# Patient Record
Sex: Male | Born: 1965 | Race: White | Hispanic: No
Health system: Midwestern US, Community
[De-identification: ages and names within clinical notes are randomized; demographics above are authoritative.]

## PROBLEM LIST (undated history)

## (undated) ENCOUNTER — Encounter

---

## 2016-05-25 ENCOUNTER — Ambulatory Visit

## 2016-05-25 ENCOUNTER — Ambulatory Visit: Admitting: Internal Medicine

## 2016-05-25 NOTE — Progress Notes (Signed)
General Medicine Visit    Service Due by Standard Protocol Rules: PHQ2 SCORE, PATPORTALPIN.      Initial Screening   * [place]: [place] (Thelma)  Ht: 68 in.  Wt: 188 lbs.   BMI: 28.69  Temp: 97.1 deg F.     BP (Initial [place] Screening): 136 / 76      BP (Rechecked, Actionable): 130 / 82 mmHg   HR: 72     Healthy Eating materials? Weight Education Handout for high BMI  Travel outside of the [place] in past 28 days:: No  Chronic Pain Assessment: Does pt experience chronic pain ? NO  Smoking Assessment: Tobacco use? never smoker    Self Management materials provided? Printed handout: Keeping You Healthy    Med List: PRINTED by [place] for patient   Med List: PRINTED by [place] for patient     Falls Risk Assessment:   In the past year, have you ... Had no falls  Difficulty with balance? NO  Need assistance with ambulation while here? NO  .....................................Marland KitchenMarland KitchenGordy Savers  May 25, 2016 11:41 AM      CHIEF COMPLAINT:  Annual physical.    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  He is feeling physically well today.  His weight is down around 12 or 13 pounds over the past 3 months.  He started on a low-carb diet and is trying to take in less than 30 g of carbohydrates per day.  He eats lean proteins and vegetables although he "cheats" once per week.  He is quite happy with his progress.  His goal weight is around 170 pounds.  He is not currently exercising, but he does belong to gym and plans to get back to exercise.    He has been compliant with his medications including lisinopril and Crestor since his last visit with me number of years back.    He had a recent kidney stone that he passed.  He has not had any recent follow up with Dr. Daphane Shepherd in [facility].    PAST MEDICAL HISTORY:  C6-7 herniated disc, mild irritable bowel, hypertension, hyperlipidemia, lipoma removed from his back, chronic left knee pain, hematuria, nephrolithiasis.    MEDICATIONS:  Per medication  list.    ALLERGIES:  PENICILLIN, which caused a rash.    PAST SURGICAL HISTORY:  Ureteral stents.    SOCIAL HISTORY:  He has never smoked cigarettes.  He drinks alcohol rarely.  He denies recreational drug use.    FAMILY HISTORY:  He is married and monogamous for the past 10 years.  He has a 47-year-old son who is healthy.  His mother is alive at age 69 with possible early memory issues.  His father is alive at age 55 with history of coronary artery disease, status post MI and CABG.  He also has a history of diabetes and obesity.  He has a 57 year old sister who is overweight and otherwise healthy.    REVIEW OF SYSTEMS:  Tdap -- Unclear when this was given.  I will need to check with Employee Health.  He may be due for a booster.  He believes he has had hepatitis B immunization series.  He has not yet had colonoscopy and will be due next year.  He saw the ophthalmologist 6 months ago.  His near vision is worsening and he has been using reading glasses.  He denies chest pain, shortness of breath, palpitations, cough, fevers, chills, or sweats.  He  denies abdominal pain, nausea, vomiting, or GERD.  His bowels are slightly loose and they pass once a day.  He thinks this is related to his current diet.  He passes urine without a problem, although it is a little bit slow in the morning.  He reports nocturia 2-3 times per night.  This may be due to the large amounts of water he drinks due to his kidney stones.  He is sleeping better since he has lost weight.  He feels less congested in general on a low-carb diet.  No specific joint aches or pains.  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    PHYSICAL EXAMINATION:  Vital signs as above.  HEENT:  Pupils are equal, round, and reactive to light.  Extraocular movements are intact.  Conjunctivae are normal.  Sclerae are anicteric.  Oral mucosa is moist without lesions.  Teeth are in excellent repair.  TMs are normal bilaterally.  Neck is without  adenopathy, thyromegaly, or bruits.  Lungs are clear to auscultation bilaterally.  Heart is in a regular rhythm without murmurs, rubs, or gallops.  Abdomen is soft and nontender without organomegaly or masses.  Genital exam reveals normal circumcised phallus with 2 normally descended testes.  No hernias are noted.  Extremities reveal 2+ pedal pulses.  There is no edema or cyanosis.  There is no axillary or inguinal adenopathy.  Neurologic exam is intact.  Skin exam reveals numerous skin tags, macular hyperpigmented moles.  None of that appear worrisome.    ASSESSMENT AND PLAN:  1.  Hypertension -- Blood pressure well controlled on lisinopril.  He may check his blood pressures at home as he continues to lose weight.  If his BPs are consistently in the 100s-110s, he may decrease his lisinopril to 10 mg daily.  2.  Hyperlipidemia -- He remains on Crestor daily.  He will return for fasting labs later this week.  3.  Health maintenance -- He will remain on his low-carb diet.  His goal weight is 170 pounds.  I have asked him to get back to regular exercise regimen.  We will plan to check TSH along with his labs.  Will also check blood sugar and A1c.  4.  Return to the office yearly or p.r.n.      PATIENT NAME:  MARKICE, TORBERT     MR #: [id number]  DATE: [id number]  DICATED BY: Wynonia Lawman M.D. SIGNED BY:   [id number] DT: [id number]   [id number]    job #[id number]  Basic Visit                          Past Medical History (prior to today's visit):  HYPERTENSION (ICD-401.9) (ICD10-I10)  RENAL MASS (ICD-593.9) (ICD10-N28.89)  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-N20.0)  RENAL INSUFFICIENCY (ICD-593.9) (ICD10-N28.9)  VIRAL SYNDROME (ICD-079.99) (ICD10-B34.9)  KNEE PAIN, RIGHT (ICD-719.46) (ICD10-M25.561)  HYPERLIPIDEMIA (ICD-272.4) (ICD10-E78.5)  HEMATURIA, MICROSCOPIC (ICD-599.72) (ICD10-R31.2)  ACROCHORDON (ICD-701.9) (ICD10-L91.8)  OBESITY (ICD-278.01) (ICD10-E66.01)  SNORING (ICD-786.09) (JOA41-Y60.63)    Past Medical  History (changes today):  Added new problem of ANNUAL EXAM (ICD-V70.0) (ICD10-Z00.00) - Signed  Removed problem of RENAL MASS (ICD-593.9) (ICD10-N28.89) - Signed  Removed problem of VIRAL SYNDROME (ICD-079.99) (ICD10-B34.9) - Signed  Removed problem of KNEE PAIN, RIGHT (ICD-719.46) (ICD10-M25.561) - Signed  Added new problem of IRRITABLE BOWEL SYNDROME (ICD-564.1) (KZS01-U93.2) - Signed       Medications (prior to today's visit):  LISINOPRIL 20 MG TABS (  LISINOPRIL) Take one tablet by mouth once daily  CRESTOR 10 MG TABS (ROSUVASTATIN CALCIUM) Take one tablet by mouth once daily.  ASPIRIN 81 MG TABS (ASPIRIN) Take one tablet by mouth once daily  LORAZEPAM 0.5 MG TABS (LORAZEPAM) one tab 30 minutes before MRI. May repeat x 1 if needed    No Changes to Medication List              Vitals:   Ht: 68 in.  Wt: 188 lbs.  BMI (in-lb) 28.69  Temp: 97.1deg F.     BP (Initial [place] Screening): 136 / 76     BP (Rechecked, Actionable): 130 / 82 mmHg   Pulse Rate: 72 bpm                Orders (this visit):  Est Prev 40-64yo (1610/RU0454) [[id number]]    Patient Care Plan          Vaccine Info Sheets       Created By Karn Cassis on 05/25/2016 at 11:41 AM    Electronically Signed By Wynonia Lawman MD on 05/27/2016 at 01:57 PM

## 2016-05-25 NOTE — Progress Notes (Signed)
Division of General Medicine - Future Orders      Requested tests to be done: __(date)____________________________        Orders     PSA [Q28571E,L010322]  LYTES [CPT-82495]  BUN [Q30940E,L001040]  Creatinine (CR) [CPT-82565]  Glucose  [CPT-82947]  Hemoglobin A1C (Glycohemoglobin) [CPT-83036]  Lipid Profile-Chol, HDL, Trig, LDL(calc) [CPT-80061]  SGOT (AST/SGOT) [CPT-84450]  SGPT (ALT/SGPT) [CPT-84460]  Thyroid Stimulating Hormone (TSH) [RUE-45409]  Vit D 25 Hydroxy [CPT-82652]        Special orders:        Created By Wynonia Lawman MD on 05/25/2016 at 01:04 PM    Electronically Signed By Wynonia Lawman MD on 05/25/2016 at 01:05 PM

## 2016-08-20 ENCOUNTER — Ambulatory Visit

## 2016-08-20 ENCOUNTER — Ambulatory Visit: Admitting: Dermatology

## 2016-08-20 ENCOUNTER — Ambulatory Visit: Admitting: Student in an Organized Health Care Education/Training Program

## 2016-08-20 NOTE — Progress Notes (Signed)
* * *      **[name], [name]**    ------    52 Y old Male, DOB: [birth year], External MRN: [id number]    Account Number: [id number]    [address], [name], [place]    Home: [phone]    Guarantor: Hantz, [name] Insurance: [employer] PPO Payer ID: PAPER    PCP: [name], MD Referring: [name] External Visit ID:  [id number]    Appointment Facility: General Dermatology        * * *    08/20/2016  **Appointment Provider:** Essie Christine, MD **CHN#:** [id number]    ------     **Supervising Provider:** NATALIA PLOTNIKOVA    ---      Current Medications    ---    Taking    * Lisinopril 20 MG Tablet 1 tablet Orally Once a day    ---    * Loperamide A-D 2 MG Tablet 1 tablet Orally after each loose bowel movement not to exceed 8 tablets a day    ---    * Simvastatin Tablet 40 mg Take 1 tablet once a day by mouth once a day    ---    Not-Taking/PRN    * Azithromycin 500 MG Tablet 1 tablet Orally once a day for up to 3 days if diarrhea (may repeat if needed)    ---    * Vivotif Berna Vaccine . Capsule Delayed Release 1 Orally every other day on an empty stomach; complete 1 week prior to departure; complete at least 1 week before any antibiotics    ---    * Medication List reviewed and reconciled with the patient    ---     Past Medical History    ---      Hypertension.        ---    Hypercholesterolemia.        ---    Renal stones.        ---    Renal insufficiency.        ---     Family History    ---     Mother: alive, hypertension    ---    Father: cabg; cad    ---    Siblings: sister healthy    ---    no personal or fh skin cancer.    ---     Allergies    ---     Penicillin: anaphylaxis: Allergy    ---    [Allergies Verified]     Review of Systems    ---     _Derm ROS_[place]_ :    Constitutional symptoms: no fever, chills, unintended weight loss.          Reason for Appointment    ---     1\. 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 skin tags    -shoulders and neck    -ongoing for years    -bothered by appearance    -no pain or bleeding    -h/o mole removal, was benign    -no tanning bed use    2\. multiple brown big bumps, getting more over recent yrs    -irritating, interested in  removal    3) multiple brown moles, especially on trunk    -does not think they are changing colors or shapes.     Vital Signs    ---    Pain scale 0.     Examination    ---     [name] Exam_ :    Patient is well appearing, no acute distress, A&Ox3;, pleasant mood.  Examination of the scalp/hair, head/face, eyes, ears, oropharynx, neck, chest,  abdomen, back, buttocks, upper extremities, lower extremities nails, and  digits was performed. All of the following were normal except:    -trunk>upper and lower extremities with 100-200 2-27mm uniform medium to dark brown round macules with regular reticular and speckled pattern, some w/ slight shape irregularity    -neck, shoulders, upper back with number of fleshy skin colored pedunculated papules    -scattered brown waxy stuck on appearing plaques.         Assessments    ---    1\. 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 Achrochordon - L91.8    ---    3\. 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 Melanocytic nevi of trunk**    Notes: -large in number but highly uniform, no concerning lesions    -ABCDEs and ugly duckling reviewed    -sun protection and sun avoidance discussed    -fbse rec yearly.     ---        **2\. Achrochordon**    Notes: -numerous of neck and shoulders    -reassurance provided regarding benign appearance    -pt requesting tx, understands out of pocket cost    -f/u in cosmetic clinic for removal.         **3\. Seborrheic keratoses**    Notes: -reassurance provided    -pt requesting removal as above, will schedule in cosmetic  clinic.         **4\. Others**    Notes: I personally interviewed and examined the patient and both the resident  and I contributed to this electronic note. I agree with the history, exam,  assessment and plan as detailed in this note and edited it as necessary.     Follow Up    ---    1 Year (Reason: fbse)    **Appointment Provider:** Essie Christine, MD    Electronically signed by Modena Morrow MD, MD on 08/20/2016 at 10:49 AM  EST    Sign off status: Completed        * * *        General Dermatology    [address]    [place], [place] [address]    Tel: [phone]    Fax: [phone]              * * *         Patient: Brandon [name], Brandon [name] DOB: [birth year] Progress Note: Essie Christine, MD  08/20/2016    ---    Note generated by eClinicalWorks EMR/PM Software ([email or web address])

## 2016-08-20 NOTE — Progress Notes (Signed)
* * *        **  Rodriges, [name]    --- ---    89 Y old Male, DOB: [birth year]    [address] [name], [place] [address]    Home: [phone]    Provider: Adria Dill, MD        * * *    Telephone Encounter    ---    Answered by   Drema Pry  Date: 08/20/2016         Time: 10:41 AM    Caller   Patient    --- ---            Reason   Booking for Dr. Beacher May on 10/07/15            Message                      Please book this patient for Sosie Gato on 10/06/16 at 2:30 pm, per our discussion. Thank you very much.                Action Taken   Fusco,Eathen 08/20/2016 10:43:35 AM > Buckmire,Joanne  08/20/2016 11:16:40 AM > All set for 10/06/16 @2 :30pm                * * *                ---          * * *          Patient: Legros, Audley J DOB: [birth year] Provider: Adria Dill, MD  08/20/2016    ---    Note generated by eClinicalWorks EMR/PM Software ([email or web address])

## 2016-08-20 NOTE — Progress Notes (Signed)
.  Progress Notes  .  Patient: Brandon [name], Brandon [name]  Provider: Essie Christine  MD  .  DOB: 05-07-1966 Age: 50 Y Sex: Male  Supervising Provider:: Modena Morrow  Date: 08/20/2016  .  PCP: [name] MD  Date: 08/20/2016  .  --------------------------------------------------------------------------------  .  REASON FOR APPOINTMENT  .  1. Skin tags  .  HISTORY OF PRESENT ILLNESS  .  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 skin  tags-shoulders and neck-ongoing for years-bothered by  appearance-no pain or bleeding-h/o mole removal, was benign-no  tanning bed use2. multiple brown big bumps, getting more over  recent yrs-irritating, interested in removal3) multiple brown  moles, especially on trunk-does not think they are changing  colors or shapes.  .  CURRENT MEDICATIONS  .  Taking Lisinopril 20 MG Tablet 1 tablet Orally Once a day  Taking Loperamide A-D 2 MG Tablet 1 tablet Orally after each  loose bowel movement not to exceed 8 tablets a day  Taking Simvastatin Tablet 40 mg Take 1 tablet once a day by mouth  once a day  Not-Taking/PRN Azithromycin 500 MG Tablet 1 tablet Orally once a  day for up to 3 days if diarrhea (may repeat if needed)  Not-Taking/PRN Vivotif Berna Vaccine . Capsule Delayed Release 1  Orally every other day on an empty stomach; complete 1 week prior  to departure; complete at least 1 week before any antibiotics  Medication List reviewed and reconciled with the patient  .  PAST MEDICAL HISTORY  .  Hypertension  Hypercholesterolemia  Renal stones  Renal insufficiency  .  ALLERGIES  .  Penicillin: anaphylaxis: Allergy  .  FAMILY HISTORY  .  Mother: alive, hypertension  Father: cabg; cad  Siblings: sister healthy  no personal or fh skin cancer.  Marland Kitchen  REVIEW OF SYSTEMS  .  Derm ROS_MN:  .  Constitutional symptoms:    no fever, chills, unintended weight  loss .  Marland Kitchen  VITAL SIGNS  .  Pain scale  0.  .  EXAMINATION  .  General Dermatology Exam: Patient is well  appearing, no acute distress, A&Ox3, pleasant mood. Examination  of the scalp/hair, head/face, eyes, ears, oropharynx, neck,  chest, abdomen, back, buttocks, upper extremities, lower  extremities nails, and digits was performed. All of the following  were normal except:-trunk>upper and lower extremities with  100-200 2-18mm uniform medium to dark brown round macules with  regular reticular and speckled pattern, some w/ slight shape  irregularity-neck, shoulders, upper back with number of fleshy  skin colored pedunculated papules-scattered brown waxy stuck on  appearing plaques.  .  ASSESSMENTS  .  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  Achrochordon - L91.8  .  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  TREATMENT  .  Melanocytic nevi of trunk  Notes: -large in number but highly uniform, no concerning  lesions-ABCDEs and ugly duckling reviewed-sun protection and sun  avoidance discussed-fbse rec yearly.  .  .  Achrochordon  Notes: -numerous of neck and shoulders-reassurance provided  regarding benign appearance-pt requesting tx, understands out of  pocket cost-f/u in cosmetic clinic for removal.  .  .  Seborrheic keratoses  Notes: -reassurance provided-pt requesting removal as above, will  schedule in cosmetic clinic.  .  .  Others  Notes: I personally interviewed and examined the patient and both  the resident and  I contributed to this electronic note. I agree  with the history, exam, assessment and plan as detailed in this  note and edited it as necessary.  .  FOLLOW UP  .  1 Year (Reason: fbse)  .  Marland Kitchen  Appointment Provider: Essie Christine, MD  .  Electronically signed by Modena Morrow MD, MD  on 08/20/2016 at 10:49 AM EST  .  CONFIRMATORY SIGN OFF  .  Marland Kitchen  Document electronically signed by Essie Christine  MD  .

## 2016-10-06 ENCOUNTER — Ambulatory Visit: Admitting: Dermatology

## 2016-10-06 ENCOUNTER — Ambulatory Visit

## 2016-10-06 NOTE — Progress Notes (Signed)
* * *      **Brandon [name], Brandon [name]**    ------    51 Y old Male, DOB: 09-27-1965, External MRN: [id number]    Account Number: [id number]    [address], [name], [place]    Home: [phone]    Guarantor: Empey, [name] Insurance: [employer] PPO Payer ID: PAPER    PCP: [name], MD Referring: [name] External Visit ID:  [id number]    Appointment Facility: General Dermatology        * * *    10/06/2016  **Appointment Provider:** Damaris SchoonerCHN#:** [id number]    ------     **Supervising Provider:** Adria Dill, MD    ---      Current Medications    ---    Taking    * Lisinopril 20 MG Tablet 1 tablet Orally Once a day    ---    * Loperamide A-D 2 MG Tablet 1 tablet Orally after each loose bowel movement not to exceed 8 tablets a day    ---    * Simvastatin Tablet 40 mg Take 1 tablet once a day by mouth once a day    ---    Not-Taking/PRN    * Azithromycin 500 MG Tablet 1 tablet Orally once a day for up to 3 days if diarrhea (may repeat if needed)    ---    * Vivotif Berna Vaccine . Capsule Delayed Release 1 Orally every other day on an empty stomach; complete 1 week prior to departure; complete at least 1 week before any antibiotics    ---     Past Medical History    ---      Hypertension.        ---    Hypercholesterolemia.        ---    Renal stones.        ---    Renal insufficiency.        ---     Allergies    ---     Penicillin: anaphylaxis: Allergy    ---      Reason for Appointment    ---     1\. 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     Examination    ---     _General Dermatology Exam_ :    CHEST, BACK, NECK: Scattered flesh colored to medium brown fleshy papules and  flesh colored pedunculated papules.         Assessments    ---    1\. Acrochordon - L91.8 (Primary)    ---     Treatment    ---      **1\.  Acrochordon**    Notes: - discussed cosmetic treatment options including snip removal with  risks/benefits/alaternatives including scarring, bleeding, recurrence    - discussed pathology fee, specimens usually discarded but very rare  possibility of amelanotic melanoma; pt wishes to discard specimens    - 15 lesions anesthetized with 1% lido with epi and snipped with sterile  scissors at base of neck and R lateral chest wall    -$150.     ---     Procedure Codes    ---  754AA COSMETIC VISIT NO CHARGE    ---     Follow Up    ---    prn    **Appointment Provider:** Damaris Schooner    Electronically signed by Adria Dill MD on 10/06/2016 at 03:58 PM EST    Sign off status: Completed        * * *        General Dermatology    [address]    [place], [place] [address]    Tel: [phone]    Fax: [phone]              * * *         Patient: Brandon [name], Brandon [name] DOB: [birth year] Progress Note: Brandon [name]  10/06/2016    ---    Note generated by eClinicalWorks EMR/PM Software ([email or web address])

## 2016-10-06 NOTE — Progress Notes (Signed)
.    Progress Notes  .  Patient: Brandon [name], Brandon [name]  Provider: Damaris Schooner  MD  .  DOB: [birth year] Age: 51 Y Sex: Male  Supervising Provider:: Adria Dill, MD  Date: 10/06/2016  .  PCP: [name] MD  Date: 10/06/2016  .  --------------------------------------------------------------------------------  .  REASON FOR APPOINTMENT  .  1. REMOVAL  .  HISTORY OF PRESENT ILLNESS  .  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  .  CURRENT MEDICATIONS  .  Taking Lisinopril 20 MG Tablet 1 tablet Orally Once a day  Taking Loperamide A-D 2 MG Tablet 1 tablet Orally after each  loose bowel movement not to exceed 8 tablets a day  Taking Simvastatin Tablet 40 mg Take 1 tablet once a day by mouth  once a day  Not-Taking/PRN Azithromycin 500 MG Tablet 1 tablet Orally once a  day for up to 3 days if diarrhea (may repeat if needed)  Not-Taking/PRN Vivotif Berna Vaccine . Capsule Delayed Release 1  Orally every other day on an empty stomach; complete 1 week prior  to departure; complete at least 1 week before any antibiotics  .  PAST MEDICAL HISTORY  .  Hypertension  Hypercholesterolemia  Renal stones  Renal insufficiency  .  ALLERGIES  .  Penicillin: anaphylaxis: Allergy  .  EXAMINATION  .  General Dermatology Exam: CHEST, BACK, NECK:  Scattered flesh colored to medium brown fleshy papules and flesh  colored pedunculated papules.  .  ASSESSMENTS  .  Acrochordon - L91.8 (Primary)  .  TREATMENT  .  Acrochordon  Notes: - discussed cosmetic treatment options including snip  removal with risks/benefits/alaternatives including scarring,  bleeding, recurrence- discussed pathology fee, specimens usually  discarded but very rare possibility of amelanotic melanoma; pt  wishes to discard specimens- 15 lesions anesthetized with 1% lido  with epi and snipped with sterile scissors at base of neck and R  lateral chest wall-$150.  Marland Kitchen  PROCEDURE CODES  .  754AA  COSMETIC VISIT NO CHARGE  .  FOLLOW UP  .  prn  .  Marland Kitchen  Appointment Provider: Damaris Schooner  .  Electronically signed by Adria Dill MD on  10/06/2016 at 03:58 PM EST  .  CONFIRMATORY SIGN OFF  URMAN,CHRISTINE , MD 10/06/2016 3:58:18 PM > , I personally interviewed and examined the patient and both the resident and I contributed to this electronic note. I agree with the history, exam, assessment and plan as detailed in this note and edited it as necessary., I was present for the procedure (detailed above) performed by the resident.  .  Document electronically signed by Damaris Schooner  MD  .

## 2017-05-05 ENCOUNTER — Ambulatory Visit

## 2017-05-05 NOTE — Telephone Encounter (Signed)
Call Details:   I spoke w. pt. by phone.  Slowing urinary stream.  He was not ready to start a med yet.......................................Marland KitchenSherrilee Gilles, MD  May 05, 2017 11:33 AM      Roney Jaffe helped.  Should we do a script?  [name]    RESPONSE/ORDERS:    Sure. Where would you like it sent to?  You also need an appt.  [facility]    [facility]  Website  Directions    Save    Address: [address] [place], [place] [address]                       Phone: [phone]      Stupid question: how do i make an appt?    Can you see me on Fri 06/04/17 at 11:00AM in [facility] on [address] and we can do your annual PE?                  ORDERS/PROBS/MEDS/ALL     Problems:   ANNUAL EXAM (ICD-V70.0) (ICD10-Z00.00)  HYPERTENSION (ICD-401.9) (ICD10-I10)  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-N20.0)  RENAL INSUFFICIENCY (ICD-593.9) (ICD10-N28.9)  HYPERLIPIDEMIA (ICD-272.4) (ICD10-E78.5)  HEMATURIA, MICROSCOPIC (ICD-599.72) (ICD10-R31.2)  ACROCHORDON (ICD-701.9) (ICD10-L91.8)  OBESITY (ICD-278.01) (ICD10-E66.01)  SNORING (ICD-786.09) (ICD10-R06.83)  IRRITABLE BOWEL SYNDROME (ICD-564.1) (ICD10-K58.9)    Meds (prior to this call):   LISINOPRIL 20 MG ORAL TABLET (LISINOPRIL) Take one tablet by mouth once daily  CRESTOR 10 MG ORAL TABLET (ROSUVASTATIN CALCIUM) Take one tablet by mouth once daily.  ASPIRIN 81 MG ORAL TABLET (ASPIRIN) Take one tablet by mouth once daily  LORAZEPAM 0.5 MG ORAL TABLET (LORAZEPAM) one tab 30 minutes before MRI. May repeat x 1 if needed    Changes to Meds (this update):   Added new medication of TAMSULOSIN HCL 0.4 MG ORAL CAPSULE (TAMSULOSIN HCL) Take one capsule by mouth once daily - Signed  Rx of TAMSULOSIN HCL 0.4 MG ORAL CAPSULE (TAMSULOSIN HCL) Take one capsule by mouth once daily;  #30[Capsule] x 5;  Signed;  Entered by: [name], MD;  Authorized by: [name], MD;  Method used: [facility] [place] [facility] [address] [address], [place], [place]  , Ph: [phone], Fax: [phone]        Created By  Wynonia Lawman MD on 05/05/2017 at 11:32 AM    Electronically Signed By Wynonia Lawman, MD - [facility] on 05/06/2017 at 10:39 AM

## 2017-06-10 ENCOUNTER — Ambulatory Visit: Admitting: Internal Medicine

## 2017-06-10 ENCOUNTER — Ambulatory Visit

## 2017-06-10 LAB — HX BF-URINALYSIS
HX KETONES: NEGATIVE mg/dL
HX LEUKOCYTE ES: NEGATIVE
HX NITRITE LEVEL: NEGATIVE
HX SPECIFIC GRAVITY: 1.018
HX U BILIRUBIN: NEGATIVE
HX U BLOOD: NEGATIVE
HX U GLUCOSE: NEGATIVE mg/dL
HX U PH: 5
HX U PROTEIN: NEGATIVE mg/dL
HX U UROBILINIG: 0.2 EU

## 2017-06-10 LAB — HX CHEM-PANELS
HX ANION GAP: 8 (ref 3–14)
HX BLOOD UREA NITROGEN: 33 mg/dL — ABNORMAL HIGH (ref 6–24)
HX CHLORIDE (CL): 108 meq/L (ref 98–110)
HX CO2: 25 meq/L (ref 20–30)
HX CREATININE (CR): 1.3 mg/dL (ref 0.57–1.30)
HX GFR, AFRICAN AMERICAN: 74 mL/min/{1.73_m2}
HX GFR, NON-AFRICAN AMERICAN: 63 mL/min/{1.73_m2}
HX GLUCOSE: 98 mg/dL (ref 70–139)
HX POTASSIUM (K): 4.2 meq/L (ref 3.6–5.1)
HX SODIUM (NA): 141 meq/L (ref 135–145)

## 2017-06-10 LAB — HX CHEM-LIPIDS
HX CHOL-HDL RATIO: 7.6
HX CHOLESTEROL: 243 mg/dL — ABNORMAL HIGH (ref 110–199)
HX HIGH DENSITY LIPOPROTEIN CHOL (HDL): 32 mg/dL — ABNORMAL LOW (ref 35–75)
HX HOURS FAST: 2 h
HX LDL CHOLESTEROL, DIRECT: 161 mg/dL — ABNORMAL HIGH (ref 0–129)
HX TRIGLYCERIDES: 434 mg/dL — ABNORMAL HIGH (ref 40–250)

## 2017-06-10 LAB — HX DIABETES
HX GLUCOSE: 98 mg/dL (ref 70–139)
HX HEMOGLOBIN A1C: 5.4 %

## 2017-06-10 LAB — HX CHEM-LFT
HX ALANINE AMINOTRANSFERASE (ALT/SGPT): 50 IU/L (ref 0–54)
HX ASPARTATE AMINOTRANFERASE (AST/SGOT): 25 IU/L (ref 10–42)

## 2017-06-10 LAB — HX CHOLESTEROL
HX CHOLESTEROL: 243 mg/dL — ABNORMAL HIGH (ref 110–199)
HX HIGH DENSITY LIPOPROTEIN CHOL (HDL): 32 mg/dL — ABNORMAL LOW (ref 35–75)
HX LDL CHOLESTEROL, DIRECT: 161 mg/dL — ABNORMAL HIGH (ref 0–129)
HX TRIGLYCERIDES: 434 mg/dL — ABNORMAL HIGH (ref 40–250)

## 2017-06-10 LAB — HX CHEM-METABOLIC
HX HEMOGLOBIN A1C: 5.4 %
HX PSA DIAGNOSTIC: 0.93 ng/mL (ref 0.00–4.00)

## 2017-06-10 LAB — HX PSA: HX PSA DIAGNOSTIC: 0.93 ng/mL (ref 0.00–4.00)

## 2017-06-10 NOTE — Progress Notes (Signed)
[facility] June 10, 2017  [address]  [place] [address]  Main: [phone]  Fax:   Patient Portal: [email or web address]            This form should be completed fully then forwarded to the Gastroenterology Scheduling Desktop    Name: Brandon [name] Gender Male DOB: [birth year] [facility] Medical Record #: [id number]  Interpreter Needed:     Yes / No  Language: English  Dialect:                    Address: [address]  [place] [address]  Email:[email or web address]  List all phone numbers and indicate the preferred number:  [  ]Home#: [phone]   [  ]Mobile #:    [  ]Work#: [phone] Best Time to Call:                     Insurance Carrier: [id number] [place] PPO Policy ID#: [id number]  Referring MD Name : [name], MD - [facility], Viviann Spare   Referring MD Extension: [phone]        REQUESTING PROCEDURE  [ X  ] Screening Colonoscopy   [   ] Colonoscopy   [   ] EGD    [   ] ERCP      [   ] EUS    [   ] Capsule Endoscopy   [   ] Flexible Sigmoidoscopy        [   ] Motility Testing   [   ] Bronchoscopy   [   ] Other: _______________  Desired procedure physician (If any):  _______________  **Urgency of Procedure:    [   ] Urgent   [   ] 1-2 weeks   [   ] Next Month  [   ] Next Available  [   ] Other:_______  Reason for Procedure:      [  X  ] Z12.11 Encounter for screening for malignant neoplasm of colon        [    ] Z80.0  Family history of malignant neoplasm of digestive disorders        [    ] Z41.010  Personal history of colonic polyps  [    ] K76.2 Gastrointestinal hemorrhage  [    ] Other Reason (please list ICD-10 codes) : _____________________________________________  MEDICAL HISTORY  [ X    ] Please check here if any of the following problems below exist for the patient        Please provide additional details if available if any of the following apply to the patient:  History of Sedation Problems:  [   ] YES /   [ X  ] NO  Home Oxygen:      [   ] YES  /  [ X  ] NO   Active  Substance Abuse:     [   ] YES /   Arly.Keller   ] NO   Psychosis:    [   ] YES  /  [  X ] NO    Presence of tracheostomy:      [   ] YES /   [ X  ] NO  Poor Functional Status:   [   ] YES  /  Arly.Keller   ] NO  Hospitalization last 30 days:  [   ] YES  /  [  X   ] NO  Bleeding Disorder: [   ] YES  /  Arly.Keller   ] NO     Anti-coag:   [ X  ] YES  /  [   ]  NO  If applicable, which anti-coagulant?      [ X  ] ASA  [   ] Coumadin  [   ] Lovenox  [   ] Plavix  [   ] Other______________       Can anti-coag medications be stopped for this procedure?     Arly.Keller   ] YES  /  [   ] NO       Plan for holding & restarting anti-coag medications: _____________________________________________      Additional Notes: _______________________________________________________    _____________________________________________________________________    _____________________________________________________________________      Appointment date & time: _________________________________________________        Name: Brandon [name] Gender Male DOB: 10/15/1965 [place] Medical Record #: [id number]    Medications on 06/10/2017:  LISINOPRIL 20 MG ORAL TABLET (LISINOPRIL) Take one tablet by mouth once daily  CRESTOR 10 MG ORAL TABLET (ROSUVASTATIN CALCIUM) Take one tablet by mouth once daily.  ASPIRIN 81 MG ORAL TABLET (ASPIRIN) Take one tablet by mouth once daily  LORAZEPAM 0.5 MG ORAL TABLET (LORAZEPAM) one tab 30 minutes before MRI. May repeat x 1 if needed  TAMSULOSIN HCL 0.4 MG ORAL CAPSULE (TAMSULOSIN HCL) Take one capsule by mouth once daily  RA SAW PALMETTO 160 MG ORAL CAPSULE (SAW PALMETTO (SERENOA REPENS))       Allergies on 06/10/2017:  No allergies entered    Problems on 06/10/2017:  SCREENING CANCER, COLON (ICD-V76.51) (ICD10-Z12.11)  URINARY FREQUENCY (ICD-788.41) (ICD10-R35.0)  ANNUAL EXAM (ICD-V70.0) (ICD10-Z00.00)  HYPERTENSION (ICD-401.9) (ICD10-I10)  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-N20.0)  RENAL INSUFFICIENCY (ICD-593.9) (ICD10-N28.9)  HYPERLIPIDEMIA  (ICD-272.4) (ICD10-E78.5)  HEMATURIA, MICROSCOPIC (ICD-599.72) (ICD10-R31.2)  ACROCHORDON (ICD-701.9) (ICD10-L91.8)  OBESITY (ICD-278.01) (ICD10-E66.01)  SNORING (ICD-786.09) (ICD10-R06.83)  IRRITABLE BOWEL SYNDROME (ICD-564.1) (YTK16-W10.9)            Created By Wynonia Lawman, MD - [facility] on 06/10/2017 at 02:53 PM    Electronically Signed By Wynonia Lawman, MD - [facility] on 08/08/2017 at 01:21 PM

## 2017-06-10 NOTE — Progress Notes (Signed)
Miscellaneous Note          pt is schedulle as follows     ophthalmology on 10/27/2017 10:30 AM with Dr. Jenne Campus    ......................................Marland KitchenEnid Skeens  June 10, 2017 3:43 PM            Allergies not determined    Plan   Medication List:   LISINOPRIL 20 MG ORAL TABLET (LISINOPRIL) Take one tablet by mouth once daily  CRESTOR 10 MG ORAL TABLET (ROSUVASTATIN CALCIUM) Take one tablet by mouth once daily.  ASPIRIN 81 MG ORAL TABLET (ASPIRIN) Take one tablet by mouth once daily  LORAZEPAM 0.5 MG ORAL TABLET (LORAZEPAM) one tab 30 minutes before MRI. May repeat x 1 if needed  TAMSULOSIN HCL 0.4 MG ORAL CAPSULE (TAMSULOSIN HCL) Take one capsule by mouth once daily  RA SAW PALMETTO 160 MG ORAL CAPSULE (SAW PALMETTO (SERENOA REPENS))                   Created By Enid Skeens on 06/10/2017 at 03:41 PM    Electronically Signed By Sueanne Margarita RN on 06/14/2017 at 02:25 PM

## 2017-06-10 NOTE — Progress Notes (Signed)
Orders     [facility] venipuncture/phlebotomy - blood draw done in [facility] ([id number]) [[id number]]  .......................................Angelia Mould RN [facility])  June 10, 2017 5:08 PM  ......................................Marland KitchenEnid Skeens  June 10, 2017 4:58 PM        Created By Enid Skeens on 06/10/2017 at 04:58 PM    Electronically Signed By Angelia Mould RN ([facility]) on 06/10/2017 at 05:08 PM

## 2017-07-06 ENCOUNTER — Ambulatory Visit: Admitting: Otolaryngology

## 2017-07-06 ENCOUNTER — Ambulatory Visit

## 2017-07-06 NOTE — Progress Notes (Signed)
* * *        **Brandon [name]**    --- ---    51 Y old Male, DOB: 1965/11/23, External MRN: [id number]    Account Number: [id number]    [address], [address] [name], [place]    Home: [phone]    Guarantor: Brandon [name] Insurance: [employer] PPO Payer ID: PAPER    PCP: Brandon Gilles, MD Referring: Brandon [name] External Visit ID:  [id number]    Appointment Facility: Otolaryngology        * * *    07/06/2017  Progress Notes: Brandon Miles, MD **CHN#:** [id number] [id number]    --- ---    ---        Current Medications    ---    Taking     * Lisinopril 20 MG Tablet 1 tablet Orally Once a day    ---    * Loperamide A-D 2 MG Tablet 1 tablet Orally after each loose bowel movement not to exceed 8 tablets a day    ---    * Simvastatin Tablet 40 mg Take 1 tablet once a day by mouth once a day    ---    Not-Taking/PRN    * Azithromycin 500 MG Tablet 1 tablet Orally once a day for up to 3 days if diarrhea (may repeat if needed)    ---    * Vivotif Berna Vaccine . Capsule Delayed Release 1 Orally every other day on an empty stomach; complete 1 week prior to departure; complete at least 1 week before any antibiotics    ---    * Medication List reviewed and reconciled with the patient    ---      Past Medical History    ---       Hypertension.        ---    Hypercholesterolemia.        ---    Renal stones.        ---    Renal insufficiency.        ---      Surgical History    ---      Denies Past Surgical History    ---      Family History    ---      Mother: alive, hypertension, diagnosed with Test    ---    Father: cabg; cad, diagnosed with Test    ---    Siblings: sister healthy    ---    no personal or fh skin cancer.    ---      Social History    ---    Tobacco history: Never smoked.    Work/Occupation: employed full-time.    Alcohol Socially.    Illicit drugs: Denies.   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    ---      Allergies    ---      Penicillin: anaphylaxis: Allergy    ---    [Allergies  Verified]      Hospitalization/Major Diagnostic Procedure    ---      Denies Past Hospitalization    ---      Review of Systems    ---     _ENT Constitutional_ :    Constitutional No. weight gain No. weight loss No. loss of appetite No, no.  fever No. weakness No. fatigue No.    _ENT Cardiac/Vascular_ :    chest pain  No. shortness of breath No. palpitations No. leg swelling No. calf  pain No. dark or dusty skin color No.    _ENT Respiratory_ :    cough No. wheezing No. coughing up blood No.    _ENT Dermatology_ :    rash No. moles No. skin cancer No. hives No. acne No.    _ENT Gastro-intestinal_ :    nausea No. vomiting No. diarrhea No. heartburn No. difficulty swallowing No.  blood in stool No. vomiting blood No. constipation No. abdominal pain No.    _ENT Hematology_ :    easy bleeding No. easy bruising No. swollen glands No.    _ENT Orthopedic/Rheumatology_ :    swollen joints No. joint pain No. back pain No. joint stiffness No. leg cramps  No. easy fractures No.    _ENT Neurology_ :    headaches No. seizures No. difficulty walking No. difficulty with speech No.  memory difficulty No. numbness/tingling No. weakness No. loss of coordination  No. difficulty sleeping No.    _ENT Psychology_ :    anxiousness No. depression No. impulsive behavior No. suicidal thoughts No.  hearing voices No. visual hallucinations No. eating disorders No. physical or  emotional abuse No.    _ENT Ophthalmology_ :    loss of vision No. double vision No. eye swelling No. eye pain No. eye dryness  No. eye drainage No.    _ENT Endocrinology_ :    cold intolerance No. heat intolerance No. increased thirst No. increased  urination No.    _ENT_ :    hearing loss No. ringing in the ears No. ear pain No. dizziness/balance  problems No. nasal congestion No. sinus pain No. nasal drainage No. postnasal  drip No. hoarseness No. throat pain No. neck swelling/pain No.            Reason for Appointment    ---      1\. Right ear blockage    ---       History of Present Illness    ---     _Ear_ :    51 year old male presents with 2 days of right ear blockage. He developed a  sore throat and nasal congestion about 10 days ago. Sore throat has resolved.  Two days ago, the right ear became blocked. He has been using Flonase and  Afrin with improvement in nasal congetion but no change in ear blockage. No  history of allergies.    Denies : hearing loss. tinnitus. otalgia. otorrhea. vertigo. frequent  childhood ear infections. ear surgery.      Vital Signs    ---    Pain scale 0.      Examination    ---     _Constitutional_ :    General Appearance wdwn, NAD, exibits no signs of acute distress.    Communication Patient communicates easily and understands our conversation.    Vocal Quality Normal.    _Neurotology exam_ :    Neurologic exam A&O x 3, CN II-XII Intact. Facial strength HB Grade I, EOMI,  Gaze Alignment normal.    _Ears_ :    Ears normal pinnae bilaterally, normal ear canal bilaterally, left TM normal,  right **TM intact with fluid meniscus in inferior/posterior middle ear with  aeration superiorly/anteriorly** **,** **microscopic** exam.    _Nose_ :    Nose dorsum straight, normal, septum midline, turbinates normal, middle meati  normal, mucosa normal, no purulent drainange .    _Oral Cavity/Oropharynx_ :    Oral Cavity/Oropharynx  lips normal, buccal mucosa normal, teeth normal,  gingiva normal, floor of mouth normal, tongue normal, hard and soft palate  normal, tonsils 2+. Posterior pharynx with normal mucosa and no lesions,  Posterior pharangeal wall normal.    _Neck_ :    Neck No adenopathy, No masses, Parotid glands normal, Submandibular glands  normal, Laryngeal framework normal, Thyroid gland normal without enargement,  tenderness or masses, Neck muscles normal, No TMJ or Muscular tenderness.    _Head and Face_ :    Overall appearance Normal.    Sinus Tenderness None.    _Skin/Neck/Lymph_ :    Overall appearance (deformity, masses): No deformity or  masses.    Face and Neck Skin: No scars, no lesions, normal texture.    Palpation/percussion of face, sinus tenderness: Palpation/percussion of face,  with no sinus tenderness.    Larynx, trachea (eg, position, crepitus, tenderness): Normal position, no  crepitius or tenderness.    _Results_ :    __.          Assessments    ---    1\. Right acute serous otitis media, recurrence not specified - H65.01  (Primary)    ---      Right serous otitis media in settin of recent URI. Patient deferred  fiberoptic laryngoscopy.    ---      Treatment    ---       **1\. Right acute serous otitis media, recurrence not specified**    Start PredniSONE Tablet, 20 MG, 2 tablets, Orally, Once a day, 2 day(s), 4,  Refills 0    Notes: Short steroid burst    If no improvement, recommend CT temporal bone.    ---        **2\. Others**    Notes: I personally interviewed and examined the patient and, if applicable,  performed the documented procedure with Benetta Spar PA-C who was  involved in the visit today. I agree with the history, exam, procedure  (description and findings if applicable) and assessment and plan as detailed  in the above note in its entirety. Because this note was not dictated by  myself or the resident, but some of the note content may have been put into  the record by the resident via eClinicalWorks, the content was checked for  accuracy based on my own interactions today and I made appropriate corrections  and additions before locking the note for the permanent record.      Follow Up    ---    2 Weeks, sooner prn    Electronically signed by Brandon [name] , MD on 07/06/2017 at 09:50 PM EDT    Sign off status: Completed        * * *        Otolaryngology    [address]    [place], [place] [address]    Tel: [phone]    Fax: [phone]              * * *          Patient: Brandon [name], Brandon [name] DOB: Dec 04, 1965 Progress Note: Brandon Miles, MD  07/06/2017    ---    Note generated by eClinicalWorks EMR/PM Software  ([email or web address])

## 2017-07-06 NOTE — Progress Notes (Signed)
.  Progress Notes  .  Patient: Brandon [name], Brandon [name]  Provider: Elijio Miles  DOB:02/18/1966 Age: 51 Y Sex: Male  .  PCP: [name] MD  Date: 07/06/2017  .  --------------------------------------------------------------------------------  .  REASON FOR APPOINTMENT  .  1. Right ear blockage  .  HISTORY OF PRESENT ILLNESS  .  Ear:  51 year old male presents with 2 days of  right ear blockage. He developed a sore throat and nasal  congestion about 10 days ago. Sore throat has resolved. Two days  ago, the right ear became blocked. He has been using Flonase and  Afrin with improvement in nasal congetion but no change in ear  blockage. No history of allergies.  Denies : hearing loss  .  tinnitus  .  otalgia  .  otorrhea  .  vertigo  .  frequent childhood ear infections  .  ear surgery  .  Marland Kitchen  CURRENT MEDICATIONS  .  Taking Lisinopril 20 MG Tablet 1 tablet Orally Once a day  Taking Loperamide A-D 2 MG Tablet 1 tablet Orally after each  loose bowel movement not to exceed 8 tablets a day  Taking Simvastatin Tablet 40 mg Take 1 tablet once a day by mouth  once a day  Not-Taking/PRN Azithromycin 500 MG Tablet 1 tablet Orally once a  day for up to 3 days if diarrhea (may repeat if needed)  Not-Taking/PRN Vivotif Berna Vaccine . Capsule Delayed Release 1  Orally every other day on an empty stomach; complete 1 week prior  to departure; complete at least 1 week before any antibiotics  Medication List reviewed and reconciled with the patient  .  PAST MEDICAL HISTORY  .  Hypertension  Hypercholesterolemia  Renal stones  Renal insufficiency  .  ALLERGIES  .  Penicillin: anaphylaxis: Allergy  .  SURGICAL HISTORY  .  Denies Past Surgical History  .  FAMILY HISTORY  .  Mother: alive, hypertension, diagnosed with Test  Father: cabg; cad, diagnosed with Test  Siblings: sister healthy  no personal or fh skin cancer.  .  SOCIAL HISTORY  .  .  Tobacco  history: Never smoked.  .  Work/Occupation: employed full-time.  .  Alcohol   Socially.  .  Illicit drugs: Denies.  .  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  .  HOSPITALIZATION/MAJOR DIAGNOSTIC PROCEDURE  .  Denies Past Hospitalization  .  REVIEW OF SYSTEMS  .  ENT Constitutional:  .  Constitutional No. weight gain No. weight loss No. loss of  appetite No,     no . fever No. weakness No. fatigue No.  .  ENT Cardiac/Vascular:  .  chest pain No. shortness of breath No. palpitations No. leg  swelling No. calf pain No. dark or dusty skin color No.  .  ENT Respiratory:  .  cough No. wheezing No. coughing up blood No.  .  ENT Dermatology:  .  rash No. moles No. skin cancer No. hives No. acne No.  .  ENT Gastro-intestinal:  .  nausea No. vomiting No. diarrhea No. heartburn No. difficulty  swallowing No. blood in stool No. vomiting blood No. constipation  No. abdominal pain No.  .  ENT Hematology:  .  easy bleeding No. easy bruising No. swollen glands No.  .  ENT Orthopedic/Rheumatology:  .  swollen joints No. joint pain No. back pain No. joint stiffness  No. leg cramps No. easy  fractures No.  .  ENT Neurology:  .  headaches No. seizures No. difficulty walking No. difficulty with  speech No. memory difficulty No. numbness/tingling No. weakness  No. loss of coordination No. difficulty sleeping No.  .  ENT Psychology:  .  anxiousness No. depression No. impulsive behavior No. suicidal  thoughts No. hearing voices No. visual hallucinations No. eating  disorders No. physical or emotional abuse No.  .  ENT Ophthalmology:  .  loss of vision No. double vision No. eye swelling No. eye pain  No. eye dryness No. eye drainage No.  .  ENT Endocrinology:  .  cold intolerance No. heat intolerance No. increased thirst No.  increased urination No.  .  ENT:  .  hearing loss No. ringing in the ears No. ear pain No.  dizziness/balance problems No. nasal congestion No. sinus pain  No. nasal drainage No. postnasal drip No. hoarseness No. throat  pain No. neck swelling/pain No.  .  VITAL SIGNS  .  Pain  scale 0.  .  EXAMINATION  .  Constitutional:  General Appearancewdwn, NAD, exibits no signs of acute distress.  CommunicationPatient communicates easily and understands our  conversation.  Vocal QualityNormal.  .  Neurotology exam:  Neurologic examA&O x 3, CN II-XII Intact. Facial strength HB  Grade I, EOMI, Gaze Alignment normal.  .  Ears:  Earsnormal pinnae bilaterally, normal ear canal bilaterally, left  TM normal, right TM intact with fluid meniscus in  inferior/posterior middle ear with aeration  superiorly/anteriorly, microscopic exam.  .  Nose:  Nosedorsum straight, normal, septum midline, turbinates normal,  middle meati normal, mucosa normal, no purulent drainange .  Marland Kitchen  Oral Cavity/Oropharynx:  Oral Cavity/Oropharynxlips normal, buccal mucosa normal, teeth  normal, gingiva normal, floor of mouth normal, tongue normal,  hard and soft palate normal, tonsils 2+. Posterior pharynx with  normal mucosa and no lesions, Posterior pharangeal wall normal.  .  Neck:  NeckNo adenopathy, No masses, Parotid glands normal,  Submandibular glands normal, Laryngeal framework normal, Thyroid  gland normal without enargement, tenderness or masses, Neck  muscles normal, No TMJ or Muscular tenderness.  .  Head and Face:  Overall appearanceNormal.  Sinus TendernessNone.  .  Skin/Neck/Lymph:  Overall appearance (deformity, masses):No deformity or masses.  Face and Neck Skin:No scars, no lesions, normal texture.  Palpation/percussion of face, sinus  tenderness:Palpation/percussion of face, with no sinus  tenderness.  Larynx, trachea (eg, position, crepitus, tenderness):Normal  position, no crepitius or tenderness.  .  Results: __.  .  ASSESSMENTS  .  Right acute serous otitis media, recurrence not specified -  H65.01 (Primary)  .  Right serous otitis media in settin of recent URI. Patient  deferred fiberoptic laryngoscopy.  .  TREATMENT  .  Right acute serous otitis media, recurrence not  specified  Start PredniSONE Tablet, 20 MG,  2 tablets, Orally, Once a day, 2  day(s), 4, Refills 0  Notes: Short steroid burstIf no improvement, recommend CT  temporal bone.  .  .  Others  Notes: I personally interviewed and examined the patient and, if  applicable, performed the documented procedure with Benetta Spar PA-C who was involved in the visit today. I agree with  the history, exam, procedure (description and findings if  applicable) and assessment and plan as detailed in the above note  in its entirety. Because this note was not dictated by myself or  the resident, but some of the note content may have been put into  the record by the resident via eClinicalWorks, the content was  checked for accuracy based on my own interactions today and I  made appropriate corrections and additions before locking the  note for the permanent record.  .  FOLLOW UP  .  2 Weeks, sooner prn  .  Electronically signed by Elijio Miles , MD on  07/06/2017 at 09:50 PM EDT  .  Document electronically signed by Elijio Miles    .

## 2017-07-11 ENCOUNTER — Ambulatory Visit

## 2017-07-11 NOTE — Progress Notes (Signed)
[facility] July 11, 2017  [address]   [place]  [place] [address]  Main: [phone]  Fax: [phone]  Patient Portal: [name]  [address]  [place], [place]  [address]          MR#: [id number]          DOB: March 25, 1966    Dear  [name],    The results of the tests performed during your visit are as follows:    Cholesterol Tests   Cholesterol 243 Normal = 110-199 06/10/2017   HDL (good cholesterol) 32 Normal > 40 06/10/2017   Triglyceride 434 Normal = 40-250 06/10/2017   LDL (bad cholesterol) 161 Normal < 130 if you have high blood pressure 06/10/2017     Diabetes Tests   Glucose random 98 Normal = 60-150 06/10/2017   HgbA1c 5.4 Normal = 4.3-5.6 06/10/2017     Liver Tests   ALT 50 Normal = 0-54 06/10/2017   AST 25 Normal = 10-42 06/10/2017     Prostate Test   PSA 0.93  Normal = 0-4.0 06/10/2017     Kidney Tests   BUN 33 Normal = 6-24 06/10/2017   Creatinine 1.30 Normal = 0.57-1.30 06/10/2017   Potassium 4.2  Normal = 3.6-5.1 06/10/2017     Your cholesterol values are too high.  Be sure that you are taking your Crestor (rosuvastatin) DAILY!  Sugar is normal.  No sign of diabetes. Prostate psa is in the low normal range. Good to see.  Have you started on saw palmetto yet?  All other labs look ok.    Please call with any questions or concerns.    Sincerely,       Wynonia Lawman, MD - [facility]  [facility]  [phone]          Created By Wynonia Lawman, MD - [facility] on 07/11/2017 at 11:52 AM    Electronically Signed By Wynonia Lawman, MD - [facility] on 07/11/2017 at 11:52 AM

## 2017-07-15 ENCOUNTER — Ambulatory Visit

## 2017-08-02 ENCOUNTER — Ambulatory Visit

## 2017-08-02 NOTE — Telephone Encounter (Signed)
Call Details:   Brandon [name],  At a conference in [place], had a few minutes to email.    1.  Forgot to refill Flomax.  (Flomax holiday)  Seem to be doing better than before I started [employer].  So maybe its actually working!  I'll keep taking it.  Have only been half compliant.  Been taking it qd at 160 which is half as much as you suggested.  Never can remember at the end of the day.  But given the mild improvement I'll double up and try to take with food as recommended.    2.  Started the new statin, do you want to test my LFTs?    [name]        [name], MD, CCD, DABR  Chief, Musculoskeletal Imaging and Intervention Director, Bone Densitometry Department of Radiology [facility] Director, Undergraduate Radiology Education Assistant Professor of Radiology and Orthopedics [facility]      RESPONSE/ORDERS:    future orders put in.......................................Marland KitchenSherrilee Gilles, MD  August 04, 2017 12:41 PM               ORDERS/PROBS/MEDS/ALL     Problems:   SCREENING CANCER, COLON (ICD-V76.51) (ICD10-Z12.11)  URINARY FREQUENCY (ICD-788.41) (ICD10-R35.0)  ANNUAL EXAM (ICD-V70.0) (ICD10-Z00.00)  HYPERTENSION (ICD-401.9) (ICD10-I10)  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-N20.0)  RENAL INSUFFICIENCY (ICD-593.9) (ICD10-N28.9)  HYPERLIPIDEMIA (ICD-272.4) (ICD10-E78.5)  HEMATURIA, MICROSCOPIC (ICD-599.72) (ICD10-R31.2)  ACROCHORDON (ICD-701.9) (ICD10-L91.8)  OBESITY (ICD-278.01) (ICD10-E66.01)  SNORING (ICD-786.09) (ICD10-R06.83)  IRRITABLE BOWEL SYNDROME (ICD-564.1) (ICD10-K58.9)    Meds (prior to this call):   LISINOPRIL 20 MG ORAL TABLET (LISINOPRIL) Take one tablet by mouth once daily  CRESTOR 10 MG ORAL TABLET (ROSUVASTATIN CALCIUM) Take one tablet by mouth once daily.  ASPIRIN 81 MG ORAL TABLET (ASPIRIN) Take one tablet by mouth once daily  LORAZEPAM 0.5 MG ORAL TABLET (LORAZEPAM) one tab 30 minutes before MRI. May repeat x 1 if needed  TAMSULOSIN HCL 0.4 MG ORAL CAPSULE  (TAMSULOSIN HCL) Take one capsule by mouth once daily  RA SAW PALMETTO 160 MG ORAL CAPSULE (SAW PALMETTO (SERENOA REPENS))           Created By Wynonia Lawman MD on 08/02/2017 at 08:16 AM    Electronically Signed By Wynonia Lawman MD on 08/04/2017 at 12:41 PM

## 2017-08-04 ENCOUNTER — Ambulatory Visit

## 2017-08-04 NOTE — Progress Notes (Signed)
Division of General Medicine - Future Orders      Requested tests to be done: __(date)____________________________        Orders     Lipid Profile-Chol, HDL, Trig, LDL(calc) [CPT-80061]  SGOT (AST/SGOT) [CPT-84450]  SGPT (ALT/SGPT) [CPT-84460]        Special orders:        Created By Wynonia Lawman MD on 08/04/2017 at 12:40 PM    Electronically Signed By Wynonia Lawman MD on 08/04/2017 at 12:41 PM

## 2017-08-23 ENCOUNTER — Ambulatory Visit

## 2017-09-02 ENCOUNTER — Ambulatory Visit

## 2017-09-02 ENCOUNTER — Ambulatory Visit: Admitting: Internal Medicine

## 2017-09-02 NOTE — Progress Notes (Signed)
Orders     Phlebotomy/venipuncture (5101) - done in [facility] [CPT-36415]  done in pdc .....................................Marland KitchenMarland KitchenTeryl Lucy  September 09, 2017 2:30 PM        Created By Teryl Lucy on 09/09/2017 at 02:30 PM    Electronically Signed By Sueanne Margarita RN on 09/09/2017 at 02:32 PM

## 2017-09-02 NOTE — Progress Notes (Signed)
General Medicine Visit    Service Due by Standard Protocol Rules: COLONOSCOPY, PHQ2 SCORE, PATPORTALPIN.      Initial Screening   * [place]: OTHER  Ht: 68 in.  Wt: 188 lbs.   Temp: 97.5 deg F.     BP (Initial [place] Screening): 124 / 80      BP (Rechecked, Actionable): 124 / 80 mmHg   HR: 85    O2 Sat: 98 %          Comments: ......................................Marland KitchenEnid Skeens  September 02, 2017 11:32 AM      [Dictation 137-1 goes here.]  CHIEF COMPLAINT:  Intermittent chest pain.    HISTORY OF PRESENT ILLNESS:  The patient is reporting an intermittent discomfort in his mid upper chest over the past few weeks to months.  He can best describe it as a mild "tightness."  It can occur with rest or activity.  He is able to do a significant amount of exertion without having the chest tightness.  His father has known heart disease and had his first heart attack at age 66.  The patient does have hyperlipidemia for which he is on Crestor.  He takes a baby aspirin daily.  His last episode of this chest discomfort was about 6-7 days ago.    He does have a history of mild anxiety.  He was worked up 10 years ago for numbness in his fingers.  He was told this was due to hypoventilation and shallow breathing when he is at rest.    He had a GI illness after Thanksgiving when he was in [place] for a conference.  He had nausea, vomiting, anorexia for 3 days.  There was no fever or diarrhea, but he states he "couldn't get out of bed."  He missed quite a bit of a conference because of this.  It took a week a more for him to feel better, but that has all completely resolved.    He does have occasional abdominal pain that occurs a few hours after eating, usually a fatty meal.  He thinks he might have a chronic cholecystitis.  He tries to avoid heavily fatty meals and these symptoms have not happened recently.    PHYSICAL EXAMINATION:  Vital signs as above.  Lungs are clear to auscultation bilaterally.  Heart is in a regular rhythm.  Abdomen  is soft, mildly tender diffusely without organomegaly or masses.  Extremities reveal no edema.    EKG reveals normal sinus rhythm with no acute ST-T wave changes.    ASSESSMENT AND PLAN:  1.  Intermittent chest discomfort - This certainly is not typical angina but given his family history of heart disease and his risk factors including hyperlipidemia I will set him up for a stress echo.  2.  Intermittent right upper quadrant abdominal pain - This is usually after a fatty meal.  Check LFTs today.  Consider right upper quadrant ultrasound.  3.  Hyperlipidemia - Check lipids and LFTs today, on rosuvastatin.  4.  Return to the office regular scheduled appointment or sooner as needed.      PATIENT NAME:  Brandon [name], Brandon [name]     MR #: [id number]  DATE: [id number]  DICATED BY: Wynonia Lawman M.D. SIGNED BY:   ZY:[id number] DT: [id number]   [id number]  job #[id number]                          Past Medical History (prior to today's visit):  SCREENING CANCER, COLON (ICD-V76.51) (ICD10-Z12.11)  URINARY FREQUENCY (ICD-788.41) (ICD10-R35.0)  ANNUAL EXAM (ICD-V70.0) (ICD10-Z00.00)  HYPERTENSION (ICD-401.9) (ICD10-I10)  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-N20.0)  RENAL INSUFFICIENCY (ICD-593.9) (ICD10-N28.9)  HYPERLIPIDEMIA (ICD-272.4) (ICD10-E78.5)  HEMATURIA, MICROSCOPIC (ICD-599.72) (ICD10-R31.2)  ACROCHORDON (ICD-701.9) (ICD10-L91.8)  OBESITY (ICD-278.01) (ICD10-E66.01)  SNORING (ICD-786.09) (ICD10-R06.83)  IRRITABLE BOWEL SYNDROME (ICD-564.1) (ZOX09-U04.5)    Past Medical History (changes today):  Added new problem of PAIN, CHEST (ICD-786.50) (ICD10-R07.9) - Signed  Added new problem of COLICKY ABDOMINAL PAIN (ICD-789.00) (ICD10-R10.83) - Signed         Medications (prior to today's visit):  LISINOPRIL 20 MG ORAL TABLET (LISINOPRIL) Take one tablet by mouth once daily  CRESTOR 10 MG ORAL TABLET (ROSUVASTATIN CALCIUM) Take one tablet by mouth once daily.  ASPIRIN 81 MG ORAL TABLET (ASPIRIN) Take one tablet by mouth once  daily  LORAZEPAM 0.5 MG ORAL TABLET (LORAZEPAM) one tab 30 minutes before MRI. May repeat x 1 if needed  TAMSULOSIN HCL 0.4 MG ORAL CAPSULE (TAMSULOSIN HCL) Take one capsule by mouth once daily  RA SAW PALMETTO 160 MG ORAL CAPSULE (SAW PALMETTO (SERENOA REPENS))     Medications (after today's visit):  LISINOPRIL 20 MG ORAL TABLET (LISINOPRIL) Take one tablet by mouth once daily  CRESTOR 10 MG ORAL TABLET (ROSUVASTATIN CALCIUM) Take one tablet by mouth once daily.  ASPIRIN 81 MG ORAL TABLET (ASPIRIN) Take one tablet by mouth once daily  TAMSULOSIN HCL 0.4 MG ORAL CAPSULE (TAMSULOSIN HCL) Take one capsule by mouth once daily  RA SAW PALMETTO 160 MG ORAL CAPSULE (SAW PALMETTO (SERENOA REPENS))                         Vitals:   Ht: 68 in.  Wt: 188 lbs.  Temp: 97.5deg F.     BP (Initial [place] Screening): 124 / 80     BP (Rechecked, Actionable): 124 / 80 mmHg   Pulse Rate: 85 bpm O2 Sat: 98 %                Orders (this visit):  Stress Echo/Exercise [CPT-93015]  Lipid Profile-Chol, HDL, Trig, LDL(calc) [CPT-80061]  Aspartate Aminotranferase (AST/SGOT) [CPT-84450]  Alanine Aminotransferase (ALT/SGPT) [CPT-84460]  Bilirubin, Total [CPT-82247]  Alkaline Phosphatase (ALK) [CPT-84075]  Est Level 3 [WUJ-81191]    Patient Care Plan      Immunization Worksheet 2016           Created By Enid Skeens on 09/02/2017 at 11:32 AM    Electronically Signed By Wynonia Lawman MD on 09/09/2017 at 02:10 PM

## 2017-09-09 ENCOUNTER — Ambulatory Visit

## 2017-09-09 ENCOUNTER — Ambulatory Visit: Admitting: Internal Medicine

## 2017-09-09 LAB — HX CHOLESTEROL
HX CHOLESTEROL: 212 mg/dL — ABNORMAL HIGH (ref 110–199)
HX HIGH DENSITY LIPOPROTEIN CHOL (HDL): 32 mg/dL — ABNORMAL LOW (ref 35–75)
HX LDL: 135 mg/dL — ABNORMAL HIGH (ref 0–129)
HX TRIGLYCERIDES: 225 mg/dL (ref 40–250)

## 2017-09-09 LAB — HX CHEM-LFT
HX ALANINE AMINOTRANSFERASE (ALT/SGPT): 71 IU/L — ABNORMAL HIGH (ref 0–54)
HX ALKALINE PHOSPHATASE (ALK): 55 IU/L (ref 40–130)
HX ASPARTATE AMINOTRANFERASE (AST/SGOT): 36 IU/L (ref 10–42)
HX BILIRUBIN, TOTAL: 1 mg/dL (ref 0.2–1.1)

## 2017-09-09 LAB — HX CHEM-LIPIDS
HX CHOL-HDL RATIO: 6.6
HX CHOLESTEROL: 212 mg/dL — ABNORMAL HIGH (ref 110–199)
HX HIGH DENSITY LIPOPROTEIN CHOL (HDL): 32 mg/dL — ABNORMAL LOW (ref 35–75)
HX HOURS FAST: 3 h
HX LDL: 135 mg/dL — ABNORMAL HIGH (ref 0–129)
HX TRIGLYCERIDES: 225 mg/dL (ref 40–250)

## 2017-09-16 ENCOUNTER — Ambulatory Visit

## 2017-09-16 ENCOUNTER — Ambulatory Visit: Admitting: Internal Medicine

## 2017-09-16 NOTE — Progress Notes (Signed)
Miscellaneous Note    Pt stopped in the office concerned about mild intermittent chest tightness with activity or at rest.  No assoc'd SOB or DOE.  Feels o/w well.  Appears mildy anxious.  EKG performed showed   Pt set up for stress test.  Remains on asa 81mg  along w. rosuvastatin daily.......................................Marland KitchenWynonia Lawman, MD - [facility]  September 30, 2017 3:54 PM                  Assessment   New Problems:  ATYPICAL CHEST PAIN (ICD-786.59) (ICD10-R07.89)    Allergies not determined    Plan   Medication List:   LISINOPRIL 20 MG ORAL TABLET (LISINOPRIL) Take one tablet by mouth once daily  CRESTOR 10 MG ORAL TABLET (ROSUVASTATIN CALCIUM) Take one tablet by mouth once daily.  ASPIRIN 81 MG ORAL TABLET (ASPIRIN) Take one tablet by mouth once daily  TAMSULOSIN HCL 0.4 MG ORAL CAPSULE (TAMSULOSIN HCL) Take one capsule by mouth once daily  RA SAW PALMETTO 160 MG ORAL CAPSULE (SAW PALMETTO (SERENOA REPENS))           Orders     EKG - on site by [place] [CPT-93000]            Created By Wynonia Lawman, MD - [facility] on 09/16/2017 at 03:52 PM    Electronically Signed By Wynonia Lawman, MD - [facility] on 10/14/2017 at 10:33 AM

## 2017-09-22 ENCOUNTER — Ambulatory Visit: Admitting: Internal Medicine

## 2017-09-22 ENCOUNTER — Ambulatory Visit

## 2017-09-23 ENCOUNTER — Ambulatory Visit

## 2017-09-23 ENCOUNTER — Ambulatory Visit: Admitting: Cardiovascular Disease

## 2017-09-23 NOTE — Progress Notes (Signed)
Division of General Medicine - Future Orders      Requested tests to be done: __(date)____________________________        Orders     Other/Misc Orders [id number]        Special orders:        Created By Wynonia Lawman, MD - [facility] on 09/23/2017 at 05:07 PM    Electronically Signed By Wynonia Lawman, MD - [facility] on 09/23/2017 at 05:12 PM

## 2017-09-24 ENCOUNTER — Ambulatory Visit: Admitting: Internal Medicine

## 2017-09-30 ENCOUNTER — Ambulatory Visit

## 2017-09-30 ENCOUNTER — Inpatient Hospital Stay
Admit: 2017-09-30 | Disposition: A | Discharge status: Home / Self Care | Source: Ambulatory Visit | Attending: Internal Medicine | Admitting: Internal Medicine

## 2017-09-30 ENCOUNTER — Ambulatory Visit: Admitting: Internal Medicine

## 2017-09-30 LAB — HX HEM-ROUTINE
HX BASO #: 0 10*3/uL (ref 0.0–0.2)
HX BASO: 0 %
HX EOSIN #: 0.1 10*3/uL (ref 0.0–0.5)
HX EOSIN: 1 %
HX HCT: 43.8 % (ref 37.0–47.0)
HX HGB: 14.5 g/dL (ref 13.5–16.0)
HX IMMATURE GRANULOCYTE#: 0.1 10*3/uL (ref 0.0–0.1)
HX IMMATURE GRANULOCYTE: 0 %
HX LYMPH #: 1.8 10*3/uL (ref 1.0–4.0)
HX LYMPH: 15 %
HX MCH: 29.5 pg (ref 26.0–34.0)
HX MCHC: 33.1 g/dL (ref 32.0–36.0)
HX MCV: 89.2 fL (ref 80.0–98.0)
HX MONO #: 0.3 10*3/uL (ref 0.2–0.8)
HX MONO: 3 %
HX MPV: 10.6 fL (ref 9.1–11.7)
HX NEUT #: 9.7 10*3/uL — ABNORMAL HIGH (ref 1.5–7.5)
HX NRBC #: 0 10*3/uL
HX NUCLEATED RBC: 0 %
HX PLT: 202 10*3/uL (ref 150–400)
HX RBC BLOOD COUNT: 4.91 M/uL (ref 4.20–5.50)
HX RDW: 12.9 % (ref 11.5–14.5)
HX SEG NEUT: 80 %
HX WBC: 12 10*3/uL — ABNORMAL HIGH (ref 4.0–11.0)

## 2017-09-30 LAB — HX COAGULATION
HX ANTI-XA LEVEL: NOT DETECTED [IU]/mL
HX INR PT: 1.1 (ref 0.9–1.3)
HX PROTHROMBIN TIME: 12.7 s (ref 9.7–14.0)
HX PTT: 32.6 s (ref 25.7–35.7)

## 2017-09-30 LAB — HX CHEM-BLOODGAS: HX WHOLE BLOOD CREATININE: 1.2 mg/dL (ref 0.4–1.3)

## 2017-09-30 LAB — HX CHEM-ENZ-FRAC
HX CKMB FRACTION: 1.5 ng/mL (ref 0.0–7.1)
HX CREATINE KINASE (CPK/CK): 125 IU/L (ref 20–210)
HX TROPONIN I: <0.01 ng/mL (ref 0.00–0.03)

## 2017-09-30 LAB — HX CHEM-PANELS
HX ANION GAP: 10 (ref 3–14)
HX BLOOD UREA NITROGEN: 32 mg/dL — ABNORMAL HIGH (ref 6–24)
HX CHLORIDE (CL): 107 meq/L (ref 98–110)
HX CO2: 23 meq/L (ref 20–30)
HX CREATININE (CR): 1.26 mg/dL (ref 0.57–1.30)
HX GFR, AFRICAN AMERICAN, WHOLE BLOOD: 81 mL/min/{1.73_m2}
HX GFR, AFRICAN AMERICAN: 76 mL/min/{1.73_m2}
HX GFR, NON- AFRICAN AMERICAN, WHOLE BLOOD: 70 mL/min/{1.73_m2}
HX GFR, NON-AFRICAN AMERICAN: 66 mL/min/{1.73_m2}
HX GLUCOSE: 155 mg/dL — ABNORMAL HIGH (ref 70–139)
HX POTASSIUM (K): 4.4 meq/L (ref 3.6–5.1)
HX SODIUM (NA): 140 meq/L (ref 135–145)

## 2017-09-30 LAB — HX CHEM-OTHER
HX CALCIUM (CA): 9.9 mg/dL (ref 8.5–10.5)
HX MAGNESIUM: 2.3 mg/dL (ref 1.6–2.6)
HX PHOSPHORUS: 3.7 mg/dL (ref 2.7–4.5)

## 2017-09-30 LAB — HX DIABETES: HX GLUCOSE: 155 mg/dL — ABNORMAL HIGH (ref 70–139)

## 2017-10-01 ENCOUNTER — Ambulatory Visit

## 2017-10-01 LAB — HX CHEM-LIPIDS
HX CHOL-HDL RATIO: 7.5
HX CHOLESTEROL: 210 mg/dL — ABNORMAL HIGH (ref 110–199)
HX HIGH DENSITY LIPOPROTEIN CHOL (HDL): 28 mg/dL — ABNORMAL LOW (ref 35–75)
HX HOURS FAST: 5 h
HX LDL: 138 mg/dL — ABNORMAL HIGH (ref 0–129)
HX TRIGLYCERIDES: 218 mg/dL (ref 40–250)

## 2017-10-01 LAB — HX CHEM-PANELS
HX ANION GAP: 9 (ref 3–14)
HX BLOOD UREA NITROGEN: 26 mg/dL — ABNORMAL HIGH (ref 6–24)
HX CHLORIDE (CL): 106 meq/L (ref 98–110)
HX CO2: 24 meq/L (ref 20–30)
HX CREATININE (CR): 1.12 mg/dL (ref 0.57–1.30)
HX GFR, AFRICAN AMERICAN: 88 mL/min/{1.73_m2}
HX GFR, NON-AFRICAN AMERICAN: 76 mL/min/{1.73_m2}
HX GLUCOSE: 108 mg/dL (ref 70–139)
HX POTASSIUM (K): 4.2 meq/L (ref 3.6–5.1)
HX SODIUM (NA): 139 meq/L (ref 135–145)

## 2017-10-01 LAB — HX HEM-ROUTINE
HX BASO #: 0 10*3/uL (ref 0.0–0.2)
HX BASO: 0 %
HX EOSIN #: 0.3 10*3/uL (ref 0.0–0.5)
HX EOSIN: 3 %
HX HCT: 42.2 % (ref 37.0–47.0)
HX HGB: 14 g/dL (ref 13.5–16.0)
HX IMMATURE GRANULOCYTE#: 0 10*3/uL (ref 0.0–0.1)
HX IMMATURE GRANULOCYTE: 0 %
HX LYMPH #: 2.8 10*3/uL (ref 1.0–4.0)
HX LYMPH: 28 %
HX MCH: 29.2 pg (ref 26.0–34.0)
HX MCHC: 33.2 g/dL (ref 32.0–36.0)
HX MCV: 88.1 fL (ref 80.0–98.0)
HX MONO #: 0.8 10*3/uL (ref 0.2–0.8)
HX MONO: 9 %
HX MPV: 10.7 fL (ref 9.1–11.7)
HX NEUT #: 5.8 10*3/uL (ref 1.5–7.5)
HX NRBC #: 0 10*3/uL
HX NUCLEATED RBC: 0 %
HX PLT: 195 10*3/uL (ref 150–400)
HX RBC BLOOD COUNT: 4.79 M/uL (ref 4.20–5.50)
HX RDW: 13 % (ref 11.5–14.5)
HX SEG NEUT: 59 %
HX WBC: 9.8 10*3/uL (ref 4.0–11.0)

## 2017-10-01 LAB — HX CHOLESTEROL
HX CHOLESTEROL: 210 mg/dL — ABNORMAL HIGH (ref 110–199)
HX HIGH DENSITY LIPOPROTEIN CHOL (HDL): 28 mg/dL — ABNORMAL LOW (ref 35–75)
HX LDL: 138 mg/dL — ABNORMAL HIGH (ref 0–129)
HX TRIGLYCERIDES: 218 mg/dL (ref 40–250)

## 2017-10-01 LAB — HX CHEM-OTHER
HX CALCIUM (CA): 9.4 mg/dL (ref 8.5–10.5)
HX MAGNESIUM: 2.2 mg/dL (ref 1.6–2.6)
HX PHOSPHORUS: 3.8 mg/dL (ref 2.7–4.5)

## 2017-10-01 LAB — HX COAGULATION: HX ANTI-XA LEVEL: 0.31 [IU]/mL

## 2017-10-01 LAB — HX DIABETES: HX GLUCOSE: 108 mg/dL (ref 70–139)

## 2017-10-01 LAB — HX CHEM-ENZ-FRAC: HX TROPONIN I: <0.01 ng/mL (ref 0.00–0.03)

## 2017-10-02 ENCOUNTER — Ambulatory Visit

## 2017-10-02 LAB — HX CHEM-OTHER
HX CALCIUM (CA): 9.5 mg/dL (ref 8.5–10.5)
HX MAGNESIUM: 2.1 mg/dL (ref 1.6–2.6)
HX PHOSPHORUS: 3.8 mg/dL (ref 2.7–4.5)

## 2017-10-02 LAB — HX CHEM-PANELS
HX ANION GAP: 8 (ref 3–14)
HX BLOOD UREA NITROGEN: 25 mg/dL — ABNORMAL HIGH (ref 6–24)
HX CHLORIDE (CL): 108 meq/L (ref 98–110)
HX CO2: 23 meq/L (ref 20–30)
HX CREATININE (CR): 1.16 mg/dL (ref 0.57–1.30)
HX GFR, AFRICAN AMERICAN: 84 mL/min/{1.73_m2}
HX GFR, NON-AFRICAN AMERICAN: 73 mL/min/{1.73_m2}
HX GLUCOSE: 103 mg/dL (ref 70–139)
HX POTASSIUM (K): 4.2 meq/L (ref 3.6–5.1)
HX SODIUM (NA): 139 meq/L (ref 135–145)

## 2017-10-02 LAB — HX HEM-ROUTINE
HX BASO #: 0 10*3/uL (ref 0.0–0.2)
HX BASO: 0 %
HX EOSIN #: 0.3 10*3/uL (ref 0.0–0.5)
HX EOSIN: 4 %
HX HCT: 43.8 % (ref 37.0–47.0)
HX HGB: 14.2 g/dL (ref 13.5–16.0)
HX IMMATURE GRANULOCYTE#: 0 10*3/uL (ref 0.0–0.1)
HX IMMATURE GRANULOCYTE: 0 %
HX LYMPH #: 2.3 10*3/uL (ref 1.0–4.0)
HX LYMPH: 25 %
HX MCH: 29 pg (ref 26.0–34.0)
HX MCHC: 32.4 g/dL (ref 32.0–36.0)
HX MCV: 89.6 fL (ref 80.0–98.0)
HX MONO #: 0.8 10*3/uL (ref 0.2–0.8)
HX MONO: 9 %
HX MPV: 10.4 fL (ref 9.1–11.7)
HX NEUT #: 5.6 10*3/uL (ref 1.5–7.5)
HX NRBC #: 0 10*3/uL
HX NUCLEATED RBC: 0 %
HX PLT: 187 10*3/uL (ref 150–400)
HX RBC BLOOD COUNT: 4.89 M/uL (ref 4.20–5.50)
HX RDW: 12.8 % (ref 11.5–14.5)
HX SEG NEUT: 62 %
HX WBC: 9 10*3/uL (ref 4.0–11.0)

## 2017-10-02 LAB — HX TRANSFUSION
HX ABO-RH INTERPRETATION (GEL): A POS
HX ANTIBODY SCREEN (GEL): NEGATIVE

## 2017-10-02 LAB — HX COAGULATION
HX INR PT: 1.1 (ref 0.9–1.3)
HX PROTHROMBIN TIME: 12.2 s (ref 9.7–14.0)
HX PTT: 29.2 s (ref 25.7–35.7)

## 2017-10-02 LAB — HX DIABETES: HX GLUCOSE: 103 mg/dL (ref 70–139)

## 2017-10-04 ENCOUNTER — Ambulatory Visit

## 2017-10-04 NOTE — Telephone Encounter (Signed)
Call Details:   Patient PCP = [name], MD  from [facility] _"CT Dept- Front Desk" called on October 04, 2017 2:45 PM.  Message taken by: Verlon Au  Primary call-back number: () -  [phone]  Secondary call-back number: () -    Call Reason(s): Message/Call-Back      ** MESSAGE / CALL-BACK.  Regarding: CT Dept called and stated that more testing is necessary for the CTA-FFR   (patient does NOT need to come back but you have to call CT and ok this)    ---------- ---------- ---------- ---------- ---------- ----------       RESPONSE/ORDERS:    Order entered for test listed above ........................................Marland KitchenRoseliene Conway  October 05, 2017 12:58 PM    Called CT to confirm this was entered. 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  ASPIRIN 81 MG ORAL TABLET (ASPIRIN) Take one tablet by mouth once daily  TAMSULOSIN HCL 0.4 MG ORAL CAPSULE (TAMSULOSIN HCL) Take one capsule by mouth once daily  RA SAW  PALMETTO 160 MG ORAL CAPSULE (SAW PALMETTO (SERENOA REPENS))             Created By Verlon Au on 10/04/2017 at 02:45 PM    Electronically Signed By Wynonia Lawman, MD - [facility] on 10/05/2017 at 02:34 PM

## 2017-10-04 NOTE — Telephone Encounter (Signed)
Call Details:   Call to pt post cath/stent in LAD on Sat 10/01/17.  No complications.  Pt reporting that his chest pain is gone but he has been feeling mildly "winded" for the past few days.  Able to get on stationary bike for 10 min w.o prob by gets winded when speaking. I spoke w. Dr. Maryruth Bun who did cath. Nothing unusual during procedure. He suggested that pt come in for an EKG and eval.  Pt agreed.  I spoke w. De Hollingshead who will arrange for pt to be seen in cath recovery for an EKG and eval by Dr. Maryruth Bun. Pt agreed. .......................................Marland KitchenSherrilee Gilles, MD  October 04, 2017 1:37 PM        RESPONSE/ORDERS:    see 10/05/17 clinical update.......................................Marland KitchenWynonia Lawman, MD - [facility]  October 05, 2017 12:49 PM                 ORDERS/PROBS/MEDS/ALL     Problems:   ABNORMAL CARDIOVASCULAR STRESS TEST (ICD-794.39) (ICD10-R94.39)  ATYPICAL CHEST PAIN (ICD-786.59) (ICD10-R07.89)  COLICKY ABDOMINAL PAIN (ICD-789.00) (ICD10-R10.83)  PAIN, CHEST (ICD-786.50) (ICD10-R07.9)  SCREENING CANCER, COLON (ICD-V76.51) (ICD10-Z12.11)  URINARY FREQUENCY (ICD-788.41) (ICD10-R35.0)  ANNUAL EXAM (ICD-V70.0) (ICD10-Z00.00)  HYPERTENSION (ICD-401.9) (ICD10-I10)  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-N20.0)  RENAL INSUFFICIENCY (ICD-593.9) (ICD10-N28.9)  HYPERLIPIDEMIA (ICD-272.4) (ICD10-E78.5)  HEMATURIA, MICROSCOPIC (ICD-599.72) (ICD10-R31.2)  ACROCHORDON (ICD-701.9) (ICD10-L91.8)  OBESITY (ICD-278.01) (ICD10-E66.01)  SNORING (ICD-786.09) (ICD10-R06.83)  IRRITABLE BOWEL SYNDROME (ICD-564.1) (ICD10-K58.9)    Meds (prior to this call):   LISINOPRIL 20 MG ORAL TABLET (LISINOPRIL) Take one tablet by mouth once daily  CRESTOR 10 MG ORAL TABLET (ROSUVASTATIN CALCIUM) Take one tablet by mouth once daily.  ASPIRIN 81 MG ORAL TABLET (ASPIRIN) Take one tablet by mouth once daily  TAMSULOSIN HCL 0.4 MG ORAL CAPSULE (TAMSULOSIN HCL) Take one capsule by mouth once daily  RA SAW PALMETTO 160 MG ORAL CAPSULE (SAW  PALMETTO (SERENOA REPENS))             Created By Wynonia Lawman MD on 10/04/2017 at 01:31 PM    Electronically Signed By Wynonia Lawman, MD - [facility] on 10/05/2017 at 12:49 PM

## 2017-10-05 ENCOUNTER — Ambulatory Visit

## 2017-10-05 NOTE — Progress Notes (Signed)
Miscellaneous Note    Dear All,     I saw [name] in the [facility] this afternoon.  His main complaint was dyspnea at rest, especially after dictating his notes. He has resumed a mild-to-moderate exercise workload and has denied disproportionate dyspnea to his exercise load. He denies chest pain or substernal chest burning, which were the symptoms that brought him to medical attention before. He is adherent with his medications with the exception of metoprolol. He was given a prescription for metoprolol upon discharge but reports that he has not been taking this medication.     His physical examination was benign. His ECG revealed NSR at a HR of 95 bpm. His right wrist was ecchymotic around the resolving hematoma, however, motor and sensory function intact.     My impression is that his complaints are not related to atherosclerotic cardiac disease or recent stent placement. Additionally, stent complications, like in-stent thrombosis, would be a massive event with extreme ECG changes (ST-elevation MI, for instance). We do not have any objective evidence of this. I believe that his complaints might be related to anxiety regarding his current health status and knowledge of his coronary anatomy. He seemed to fixate on his "critically blocked" mid-LAD lesion.     I recommend that he continue with his ASA, clopidogrel and high-dose statin. He will need a lipid panel in three months. He does not need to be on a beta blocker after successful revascularization. He can treat his arm symptomatically with hot/cold packs and NSAIDs.     Please do not hesitate to contact me regarding Dr. Jolyn Nap care.    Murrell Redden, MD  Interventional Cardiology Fellow  [facility]  [address], [place], [place] [address]  Ph: [phone] (cell)  Fax: [phone]  Pager: [phone]              Allergies not determined    Plan   Medication List:   LISINOPRIL 20 MG ORAL TABLET  (LISINOPRIL) Take one tablet by mouth once daily  CRESTOR 10 MG ORAL TABLET (ROSUVASTATIN CALCIUM) Take one tablet by mouth once daily.  ASPIRIN 81 MG ORAL TABLET (ASPIRIN) Take one tablet by mouth once daily  TAMSULOSIN HCL 0.4 MG ORAL CAPSULE (TAMSULOSIN HCL) Take one capsule by mouth once daily  RA SAW PALMETTO 160 MG ORAL CAPSULE (SAW PALMETTO (SERENOA REPENS))                   Created By Wynonia Lawman, MD - [facility] on 10/05/2017 at 09:18 AM    Electronically Signed By Wynonia Lawman, MD - [facility] on 10/05/2017 at 09:18 AM

## 2017-10-05 NOTE — Telephone Encounter (Signed)
Call Details:   Clydie Braun - please set pt up w. Dr. Miles Costain in cardiology re: F/U CAD/setnt placement.  Call pt w date.time./ thx.......................................Marland KitchenWynonia Lawman, MD - [facility]  October 05, 2017 2:35 PM        RESPONSE/ORDERS:    I tried to contact the patient with apt info but his mailbox is full and cannot accept any messages so i will mail an apt letter.......................................Marland KitchenVerlon Au  October 06, 2017 9:39 AM               ORDERS/PROBS/MEDS/ALL     Problems:   ABNORMAL CARDIOVASCULAR STRESS TEST (ICD-794.39) (ICD10-R94.39)  ATYPICAL CHEST PAIN (ICD-786.59) (ICD10-R07.89)  COLICKY ABDOMINAL PAIN (ICD-789.00) (ICD10-R10.83)  PAIN, CHEST (ICD-786.50) (ICD10-R07.9)  SCREENING CANCER, COLON (ICD-V76.51) (ICD10-Z12.11)  URINARY FREQUENCY (ICD-788.41) (ICD10-R35.0)  ANNUAL EXAM (ICD-V70.0) (ICD10-Z00.00)  HYPERTENSION (ICD-401.9) (ICD10-I10)  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-N20.0)  RENAL INSUFFICIENCY (ICD-593.9) (ICD10-N28.9)  HYPERLIPIDEMIA (ICD-272.4) (ICD10-E78.5)  HEMATURIA, MICROSCOPIC (ICD-599.72) (ICD10-R31.2)  ACROCHORDON (ICD-701.9) (ICD10-L91.8)  OBESITY (ICD-278.01) (ICD10-E66.01)  SNORING (ICD-786.09) (ICD10-R06.83)  IRRITABLE BOWEL SYNDROME (ICD-564.1) (ICD10-K58.9)    Meds (prior to this call):   LISINOPRIL 20 MG ORAL TABLET (LISINOPRIL) Take one tablet by mouth once daily  CRESTOR 10 MG ORAL TABLET (ROSUVASTATIN CALCIUM) Take one tablet by mouth once daily.  ASPIRIN 81 MG ORAL TABLET (ASPIRIN) Take one tablet by mouth once daily  TAMSULOSIN HCL 0.4 MG ORAL CAPSULE (TAMSULOSIN HCL) Take one capsule by mouth once daily; Route: ORAL  RA SAW PALMETTO 160 MG ORAL CAPSULE (SAW PALMETTO (SERENOA REPENS)) ; Route: ORAL            Created By Wynonia Lawman, MD - [facility] on 10/05/2017 at 02:34 PM    Electronically Signed By Wynonia Lawman, MD - [facility] on 10/17/2017 at 04:30 PM

## 2017-10-06 ENCOUNTER — Ambulatory Visit

## 2017-10-06 NOTE — Progress Notes (Signed)
[facility] October 06, 2017  [address]   [place]  [place] [address]  Main: [phone]  Fax: [phone]  Patient Portal: [name]  [address]  [place], [place]  [address]  MR#: [id number]      Dear  Mr. GARLOW,      I am writing to let you know that the following appointments have been made for you:    Cardiology Clinic   Date/Time: 11/16/17 @ 3:00pm   Doctor:  [place]   The Cardiology Clinic is located in the [facility] [facility], 6th floor. Should you   need to make any changes in your appointment, their office number is [phone]  [phone].          Please call me if you have any questions.        Sincerely,          Verlon Au  [facility]                  Created By Verlon Au on 10/06/2017 at 09:41 AM    Electronically Signed By Wynonia Lawman MD on 10/13/2017 at 11:32 AM

## 2017-10-10 ENCOUNTER — Ambulatory Visit: Admitting: Internal Medicine

## 2017-10-19 ENCOUNTER — Ambulatory Visit

## 2017-10-19 ENCOUNTER — Ambulatory Visit: Admitting: Internal Medicine

## 2017-10-19 LAB — HX CHEM-PANELS
HX ANION GAP: 8 (ref 3–14)
HX BLOOD UREA NITROGEN: 32 mg/dL — ABNORMAL HIGH (ref 6–24)
HX CHLORIDE (CL): 107 meq/L (ref 98–110)
HX CO2: 26 meq/L (ref 20–30)
HX CREATININE (CR): 1.28 mg/dL (ref 0.57–1.30)
HX GFR, AFRICAN AMERICAN: 75 mL/min/{1.73_m2}
HX GFR, NON-AFRICAN AMERICAN: 65 mL/min/{1.73_m2}
HX POTASSIUM (K): 4.2 meq/L (ref 3.6–5.1)
HX SODIUM (NA): 141 meq/L (ref 135–145)

## 2017-10-19 LAB — HX CHEM-LIPIDS
HX CHOL-HDL RATIO: 6.5
HX CHOLESTEROL: 189 mg/dL (ref 110–199)
HX HIGH DENSITY LIPOPROTEIN CHOL (HDL): 29 mg/dL — ABNORMAL LOW (ref 35–75)
HX HOURS FAST: 2 h
HX LDL CHOLESTEROL, DIRECT: 103 mg/dL (ref 0–129)
HX TRIGLYCERIDES: 496 mg/dL — ABNORMAL HIGH (ref 40–250)

## 2017-10-19 LAB — HX CHEM-LFT
HX ALANINE AMINOTRANSFERASE (ALT/SGPT): 61 IU/L — ABNORMAL HIGH (ref 0–54)
HX ALKALINE PHOSPHATASE (ALK): 61 IU/L (ref 40–130)
HX ASPARTATE AMINOTRANFERASE (AST/SGOT): 29 IU/L (ref 10–42)
HX BILIRUBIN, TOTAL: 0.8 mg/dL (ref 0.2–1.1)

## 2017-10-19 LAB — HX CHOLESTEROL
HX CHOLESTEROL: 189 mg/dL (ref 110–199)
HX HIGH DENSITY LIPOPROTEIN CHOL (HDL): 29 mg/dL — ABNORMAL LOW (ref 35–75)
HX LDL CHOLESTEROL, DIRECT: 103 mg/dL (ref 0–129)
HX TRIGLYCERIDES: 496 mg/dL — ABNORMAL HIGH (ref 40–250)

## 2017-10-19 NOTE — Progress Notes (Signed)
Orders     Phlebotomy/venipuncture (5101) - done in [facility] [CPT-36415]  done in pdc........................................Marland KitchenKathreen Cosier  October 19, 2017 4:38 PM        Created By Kathreen Cosier on 10/19/2017 at 04:38 PM    Electronically Signed By Kathreen Cosier on 10/19/2017 at 04:38 PM

## 2017-10-19 NOTE — Progress Notes (Signed)
General Medicine Visit    Service Due by Standard Protocol Rules: COLONOSCOPY, PHQ2 SCORE, PATPORTALPIN.      Initial Screening   Ht: 68 in.     BP (Initial [place] Screening): 146 / 90      BP (Rechecked, Actionable): 146 / 90 mmHg   HR: 84               CHIEF COMPLAINT:  Follow-up coronary stenting and hypertension.    HISTORY OF PRESENT ILLNESS:  The patient went for a stress echo after his last visit here.  He has been experiencing atypical chest pain and a stress test did show positive ST segment depression and T-wave inversions in II, III and aVF.  He underwent a CTA of the coronary arteries shortly after that, which revealed a mid LAD "critical stenosis."  He was admitted to the hospital and underwent a coronary catheterization, which found a 90% mid LAD lesion, which was stented with a DES.  He had a right dominant circulation with a normal left main, normal left circumflex, and normal RCA.  No complications during the catheterization and stenting procedure.  He was discharged from the hospital the following day on 25 mg of metoprolol, 75 mg of Plavix and the Crestor dose increased to 40 mg daily.  He remains on lisinopril and baby aspirin.    Since discharge, he has not had any further chest pain.  He has felt well.  He remains compliant with all of his medications except the metoprolol, which he was not sure it was necessary and he was concerned about the side effects related to the medication.  He has had no further chest pain.    His good spirits today as he presents for a routine follow-up.  He plans to get back to a regular exercise regimen.  He has a stationary bike and elliptical at home.  He would like to meet with a nutritionist to discuss the better eating habits and he is planning on bringing his weight down.    He has an appointment with Dr. Jerald Kief in the General Cardiology Clinic on 11/16/17.    He has not been checking his blood pressures at home.    PHYSICAL EXAMINATION:  Vital signs as  above.  Lungs are clear to auscultation bilaterally.  Heart is in a regular rhythm.  Abdomen is soft and nontender.  Extremities reveal no edema.    ASSESSMENT AND PLAN:  1.  Coronary artery disease -- The patient had single vessel disease on recent catheterization with a drug-eluting stent placed in the mid left anterior descending for a 90% soft plaque lesion.  He will see Dr. Jerald Kief in Cardiology Clinic next month.  He remains on Plavix, high-dose Crestor and baby aspirin.  He is not currently taking his metoprolol 25 mg and I do know that it is necessary at this time.  He denies any ongoing symptoms.  He does plan to get back to regular exercise regimen.  2.  Hypertension -- Blood pressure is still borderline in the office.  I have asked him to check his blood pressures at home and e-mail me once a week with the readings.  They can be checked every day or every other day.  If need be, I will add hydrochlorothiazide to his lisinopril.  3.  Hyperlipidemia -- Check lipids and liver function tests today on the higher dose of Crestor.  4.  Renal insufficiency --.  His creatinine when he left the hospital is  1.16.  We will recheck creatinine today given the dye load for the catheterization test.  5.  Overweight -- I will set up an appointment with a nutritionist.  He plans to get back to regular exercise regimen and he would like to see his weight come down.  6.  Return to the office in 4 weeks for follow-up.      PATIENT NAME:  Brandon [name], Brandon [name]     MR #: [id number]  DATE: [id number]  DICATED BY: Wynonia Lawman M.D. SIGNED BY:   [id number] DT: [id number]   [id number]    job #[id number]                          Past Medical History (prior to today's visit):  ABNORMAL CARDIOVASCULAR STRESS TEST (ICD-794.39) (ICD10-R94.39)  ATYPICAL CHEST PAIN (ICD-786.59) (ICD10-R07.89)  COLICKY ABDOMINAL PAIN (ICD-789.00) (ICD10-R10.83)  PAIN, CHEST (ICD-786.50) (ICD10-R07.9)  SCREENING CANCER, COLON (ICD-V76.51)  (ICD10-Z12.11)  HYPERTENSION (ICD-401.9) (ICD10-I10)  ANNUAL EXAM (ICD-V70.0) (ICD10-Z00.00)  URINARY FREQUENCY (ICD-788.41) (ICD10-R35.0)  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-N20.0)  RENAL INSUFFICIENCY (ICD-593.9) (ICD10-N28.9)  HYPERLIPIDEMIA (ICD-272.4) (ICD10-E78.5)  HEMATURIA, MICROSCOPIC (ICD-599.72) (ICD10-R31.2)  ACROCHORDON (ICD-701.9) (ICD10-L91.8)  OBESITY (ICD-278.01) (ICD10-E66.01)  SNORING (ICD-786.09) (ICD10-R06.83)  IRRITABLE BOWEL SYNDROME (ICD-564.1) (YTK16-W10.9)    Past Medical History (changes today):  Added new problem of CORONARY ARTERY DISEASE (ICD-414.00) (ICD10-I25.10) - Signed  Removed problem of ABNORMAL CARDIOVASCULAR STRESS TEST (ICD-794.39) (ICD10-R94.39) - Signed  Removed problem of ATYPICAL CHEST PAIN (NAT-557.32) (ICD10-R07.89) - Signed  Removed problem of COLICKY ABDOMINAL PAIN (ICD-789.00) (ICD10-R10.83) - Signed  Removed problem of PAIN, CHEST (ICD-786.50) (ICD10-R07.9) - Signed  Removed problem of SCREENING CANCER, COLON (ICD-V76.51) (ICD10-Z12.11) - Signed  Removed problem of ANNUAL EXAM (ICD-V70.0) (ICD10-Z00.00) - Signed         Medications (prior to today's visit):  LISINOPRIL 20 MG ORAL TABLET (LISINOPRIL) Take one tablet by mouth once daily  CRESTOR 10 MG ORAL TABLET (ROSUVASTATIN CALCIUM) Take one tablet by mouth once daily.  ASPIRIN 81 MG ORAL TABLET (ASPIRIN) Take one tablet by mouth once daily  TAMSULOSIN HCL 0.4 MG ORAL CAPSULE (TAMSULOSIN HCL) Take one capsule by mouth once daily; Route: ORAL  RA SAW PALMETTO 160 MG ORAL CAPSULE (SAW PALMETTO (SERENOA REPENS)) ; Route: ORAL    Medications (after today's visit):  LISINOPRIL 20 MG ORAL TABLET (LISINOPRIL) Take one tablet by mouth once daily  CRESTOR 40 MG ORAL TABLET (ROSUVASTATIN CALCIUM) Take one tablet by mouth once daily.; Route: ORAL  PLAVIX 75 MG ORAL TABLET (CLOPIDOGREL BISULFATE) one by mouth daily; Route: ORAL  ASPIRIN 81 MG ORAL TABLET (ASPIRIN) Take one tablet by mouth once daily  TAMSULOSIN HCL 0.4 MG ORAL  CAPSULE (TAMSULOSIN HCL) Take one capsule by mouth once daily; Route: ORAL  RA SAW PALMETTO 160 MG ORAL CAPSULE (SAW PALMETTO (SERENOA REPENS)) ; Route: ORAL                        Vitals:   Ht: 68 in.    BP (Initial [place] Screening): 146 / 90     BP (Rechecked, Actionable): 146 / 90 mmHg   Pulse Rate: 84 bpm                Orders (this visit):  Lipid Profile-Chol, HDL, Trig, LDL(calc) [CPT-80061]  Aspartate Aminotranferase (AST/SGOT) [CPT-84450]  Alanine Aminotransferase (ALT/SGPT) [CPT-84460]  Bilirubin, Total [CPT-82247]  Alkaline Phosphatase (ALK) [CPT-84075]  Electrolytes (Na, K, Cl, Co2) [CPT-82495]  Blood Urea  Nitrogen (BUN) [CPT-84520]  Creatinine (CR) [CPT-82565]  RTC (Return to Clinic) [RTC-000]  Est Level 3 [BJY-78295]  [facility] [Ref-Nutrition]    Patient Care Plan      Immunization Worksheet 2016           Created By Wynonia Lawman, MD - [facility] on 10/19/2017 at 04:12 PM    Electronically Signed By Wynonia Lawman, MD - [facility] on 10/21/2017 at 01:52 PM

## 2017-11-01 ENCOUNTER — Ambulatory Visit

## 2017-11-23 ENCOUNTER — Ambulatory Visit

## 2017-11-23 ENCOUNTER — Ambulatory Visit: Admitting: Cardiovascular Disease

## 2017-11-23 NOTE — Progress Notes (Signed)
.  Progress Notes  .  Patient: EARL, LOSEE  Provider: Cherylin Mylar  MD  .  DOB: 1966-04-24 Age: 52 Y Sex: Male  Supervising Provider:: Cherylin Mylar  Date: 11/23/2017  .  PCP: [name] MD  Date: 11/23/2017  .  --------------------------------------------------------------------------------  .  REASON FOR APPOINTMENT  .  1. Follow-Up appointment status post PCI in February  .  HISTORY OF PRESENT ILLNESS  .  GENERAL:  Dear Dr. [name]:Thank you very much for  the opportunity to evaluate, Dr. 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 In  10/2017, he was complaining of 2 months of antecedent chest pain  and was admitted after an outpatient CT revealed high grade left  anterior descending artery stenosis with stigmata concerning for  plaque rupture. He underwent a left heart catheterization on  01/26, which showed a 90% mid LAD stenosis and he received a 30 x  20 Synergy drug-eluting stent. This stent was postdilated to 3.5.  The access was through the right radial artery. The procedure was  complicated by a right radial artery hematoma, which has since  resolved.Regarding his review of cardiovascular symptoms, he  denies any chest pain or dyspnea at rest or with exertion,  orthopnea, PND, palpitations, lightheadedness, dizziness, lower  extremity edema, presyncope, or syncope. He remains adherent with  his medications, especially dual antiplatelet therapy. He reports  no functional limitations at work or with recreation. He remains  engaged in both familial and professional activities.  .  CURRENT MEDICATIONS  .  Taking Lisinopril 20 MG Tablet 1 tablet Orally Once a day  Taking  Loperamide A-D 2 MG Tablet 1 tablet Orally after each  loose bowel movement not to exceed 8 tablets a day  Taking PredniSONE 20 MG Tablet 2 tablets Orally Once a day  Taking Simvastatin Tablet 40 mg Take 1 tablet once a day by mouth  once a day  Not-Taking/PRN Azithromycin 500 MG Tablet 1 tablet Orally once a  day for up to 3 days if diarrhea (may repeat if needed)  Not-Taking/PRN Vivotif Berna Vaccine . Capsule Delayed Release 1  Orally every other day on an empty stomach; complete 1 week prior  to departure; complete at least 1 week before any antibiotics  .  PAST MEDICAL HISTORY  .  Hypertension  Hypercholesterolemia  Renal stones  Renal insufficiency  .  ALLERGIES  .  Penicillin: anaphylaxis - Allergy  .  SURGICAL HISTORY  .  No Surgical History documented.  Marland Kitchen  FAMILY HISTORY  .  Mother: alive, hypertension, diagnosed with Test  Father: cabg; cad, Test  Siblings: sister healthy  no personal or fh skin cancer.  .  SOCIAL HISTORY  .  .  Tobacco  history: Never smoked.  .  Work/Occupation: employed full-time.  .  Alcohol  Socially.  .  Illicit drugs: Denies.  .  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  .  HOSPITALIZATION/MAJOR DIAGNOSTIC PROCEDURE  .  No Hospitalization History.  Marland Kitchen  REVIEW OF SYSTEMS  .  CardioVascular:  .  Neurologic    no numbness or weakness, no visual disturbance, no  difficulty speaking, parasthesias . General/Constitutional  no  fever, chills, night sweats, sleep disturbance, headaches, no  change in appetite, no fatigue, no weight gain/loss .  Gastrointestinal    no melena, no hematochezia, hematemses, no  blood in stool, nausea, vomiting, diarrhea, abdominal pain .  Hematology    no easy bruising, no prolonged bleeding .  Peripheral Vascular    no claudication, no skin ulceration .  Endocrine    no polydipsia, polyphagia, hot and cold intolerance  . Skin    no rash . ENT    no nosebleeds . Cardiovascular    See  HPI . Respiratory    See HPI . Genitourinary    no blood in  urine  . Musculoskeletal    no arthraglias, myalgias, or muscle aches .  Marland Kitchen  VITAL SIGNS  .  Pain scale 0, Ht-in 68, Wt-lbs 187.4, BMI 28.49, BP 138/81, HR  85, BSA 2.02, O2 96%RA, Wt Change -6.6 lb.  .  PHYSICAL EXAMINATION  .  General Examination:  General Appearance  well developed, well nourished, alert,  oriented, in no acute distress. Head  normocephalic. Eyes  sclera  non-icteric. Neck/Thyroid  carotid pulse normal, no carotid  bruit, no jugular venous distension. Skin  no rashes. Heart   regular rate and rhythm, normal S1, normal S2, diastolic murmur  II/IV, no friction rubs, no clicks, no gallops. Lungs  clear to  auscultation bilaterally with no wheezing, no crackles, no  rhonchi. Abdomen  soft, non tender, non distended. Extremities   no edema, no clubbing, no cyanosis. Peripheral Pulses  normal  symmetric pulses bilaterally.  .  ASSESSMENTS  .  Coronary artery disease involving native coronary artery of  native heart without angina pectoris - I25.10 (Primary)  .  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 He  received 1 drug-eluting stent to the mid LAD. He has been  intolerant of dual antiplatelet therapy and returns to the  outpatient office for a routine follow-up evaluation. His right  wrist appears to be intact from a neurovascular evaluation. He  does not have any numbness or tingling or functional disability  with the use of his right hand. Regarding his dual antiplatelet  therapy, he remains on aspirin and Plavix. He denies any  hematemesis, hematochezia or hematuria. He does report some  nuisance bleeding, but otherwise no other major concerns. He is  currently on Crestor 40 mg once daily. His last lipid profile  reveals a total cholesterol of 189 mg/dL, triglyceride level of  496 mg/dL,  HDL level of 29 mg/dL and an LDL of 846. He has not  had a subsequent lipid profile after initiation of statin  therapy. Regarding a diagnostic plan, we plan to evaluate his  lipid profile by obtaining a lipid panel after today's office  visit. Regarding his hypertension, he is currently on Toprol-XL  and lisinopril 20. He still remains hypertensive at today's  office visit with a blood pressure of 138/81. We suggest  increasing his lisinopril from 20 mg to 40 mg and continuing with  Toprol-XL at 25 mg. Regarding his dyslipidemia, we suggest that  he continue with Crestor 40 mg once nightly and we will  reevaluate the need for additional therapy after looking at his  lipid panel. Upon evaluation examination, he does have an  auscultatory diastolic murmur and we  would like to evaluate his  cardiac structure and function with an echocardiogram. We plan to  see him back in the office in 3 months to review the diagnostic  testing recommended. Thank you very much for allowing our team to  participate in the care of [name]. Should there be any  questions regarding his care, please do not hesitate to contact  the office.  .  TREATMENT  .  Coronary artery disease involving native coronary  artery of native heart without angina pectoris  Start Clopidogrel Bisulfate Tablet, 75 mg, 1 tablet, Orally, Once  a day, 90 days, 90 Tablet, Refills 3  Start Lisinopril Tablet, 40 mg, 1 tablet, Orally, Once a day, 90  days, 90 Tablet, Refills 3  LAB: CPK+CKMB (CKMB)  .  LAB: LIPID PANEL  .  LAB: Liver Function Tests Panel (LFTP)  .  Echo (1O)[id number]  Notes: To have fasting blood work done.  .  FOLLOW UP  .  3 Months  .  Marland Kitchen  Appointment Provider: Cherylin Mylar  .  Electronically signed by Frances Maywood , MD on  01/12/2018 at 03:33 PM EDT  .  CONFIRMATORY SIGN OFF  .  Marland Kitchen  Document electronically signed by Cherylin Mylar  MD  .

## 2017-11-23 NOTE — Progress Notes (Signed)
* * *        **Biddy, Livan J**    --- ---    52 Y old Male, DOB: 06-20-1966, External MRN: [id number]    Account Number: [id number]    [address], [address] [name], [id number]    Home: [phone]    Guarantor: Tippen, [name] Insurance: [employer] [facility] [employer] PPO Payer ID: PAPER    PCP: [name], MD Referring: [name] External Visit ID:  [id number]    Appointment Facility: Cardiovascular Clinic        * * *    11/23/2017   **Appointment Provider:** Cherylin Mylar **CHN#:** [id number]    --- ---      **Supervising Provider:** Khya Halls    ---         **Current Medications**    ---    Taking     * Lisinopril 20 MG Tablet 1 tablet Orally Once a day    ---    * Loperamide A-D 2 MG Tablet 1 tablet Orally after each loose bowel movement not to exceed 8 tablets a day    ---    * PredniSONE 20 MG Tablet 2 tablets Orally Once a day    ---    * Simvastatin Tablet 40 mg Take 1 tablet once a day by mouth once a day    ---    Not-Taking/PRN    * Azithromycin 500 MG Tablet 1 tablet Orally once a day for up to 3 days if diarrhea (may repeat if needed)    ---    * Vivotif Berna Vaccine . Capsule Delayed Release 1 Orally every other day on an empty stomach; complete 1 week prior to departure; complete at least 1 week before any antibiotics    ---      Past Medical History    ---       Hypertension.        ---    Hypercholesterolemia.        ---    Renal stones.        ---    Renal insufficiency.        ---       **Surgical History**    ---       No Surgical History documented.    ---       **Family History**    ---       Mother: alive, hypertension, diagnosed with Test    ---    Father: cabg; cad, Test    ---    Siblings: sister healthy    ---    no personal or fh skin cancer.    ---       **Social History**    ---    Tobacco history: Never smoked.    Work/Occupation: employed full-time.    Alcohol Socially.    Illicit drugs: Denies.   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    ---       **Allergies**    ---        Penicillin: anaphylaxis - Allergy    ---    [Allergies Verified]       **Hospitalization/Major Diagnostic Procedure**    ---       No Hospitalization History.    ---       **Review of Systems**    ---     _CardioVascular_ :    Neurologic no numbness or weakness, no visual disturbance, no difficulty  speaking, parasthesias. General/Constitutional no fever, chills, night sweats,  sleep disturbance, headaches, no change in appetite, no fatigue, no weight  gain/loss. Gastrointestinal no melena, no hematochezia, hematemses, no blood  in stool, nausea, vomiting, diarrhea, abdominal pain. Hematology no easy  bruising, no prolonged bleeding. Peripheral Vascular no claudication, no skin  ulceration. Endocrine no polydipsia, polyphagia, hot and cold intolerance.  Skin no rash. ENT no nosebleeds. Cardiovascular See HPI. Respiratory See HPI.  Genitourinary no blood in urine. Musculoskeletal no arthraglias, myalgias, or  muscle aches.            **Reason for Appointment**    ---       1\. Follow-Up appointment status post PCI in February    ---       **History of Present Illness**    ---     _GENERAL_ :    Dear Dr. [name]:    Thank you very much for the opportunity to evaluate, Dr. Fayette Pho, in the  Cardiovascular Clinic at [facility] on 11/23/17.    This is his first outpatient cardiology visit after hospitalization for  unstable angina and subsequent percutaneous coronary intervention.    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 In 10/2017, he was complaining of 2 months of antecedent chest pain  and was admitted after an outpatient CT revealed high grade left anterior  descending artery stenosis with stigmata concerning for plaque rupture. He  underwent a left heart catheterization on 01/26, which showed a 90% mid LAD  stenosis and he received a 30 x 20 Synergy drug-eluting stent. This  stent was  postdilated to 3.5. The access was through the right radial artery. The  procedure was complicated by a right radial artery hematoma, which has since  resolved.    Regarding his review of cardiovascular symptoms, he denies any chest pain or  dyspnea at rest or with exertion, orthopnea, PND, palpitations,  lightheadedness, dizziness, lower extremity edema, presyncope, or syncope. He  remains adherent with his medications, especially dual antiplatelet therapy.  He reports no functional limitations at work or with recreation. He remains  engaged in both familial and professional activities.       **Vital Signs**    ---    Pain scale 0, Ht-in 68, Wt-lbs 187.4, BMI 28.49, BP 138/81, HR 85, BSA 2.02,  O2 96%RA, Wt Change -6.6 lb.       **Physical Examination**    ---     _General Examination_ :    General Appearance well developed, well nourished, alert, oriented, in no  acute distress. Head normocephalic. Eyes sclera non-icteric. Neck/Thyroid  carotid pulse normal, no carotid bruit, no jugular venous distension. Skin no  rashes. Heart regular rate and rhythm, normal S1, normal S2, diastolic murmur  II/IV, no friction rubs, no clicks, no gallops. Lungs clear to auscultation  bilaterally with no wheezing, no crackles, no rhonchi. Abdomen soft, non  tender, non distended. Extremities no edema, no clubbing, no cyanosis.  Peripheral Pulses normal symmetric pulses bilaterally.          **Assessments**    ---    1\. 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 He received 1 drug-eluting stent to the mid LAD. He has  been intolerant of dual antiplatelet therapy and returns to the  outpatient  office for a routine follow-up evaluation. His right wrist appears to be  intact from a neurovascular evaluation. He does not have any numbness or  tingling or functional disability with the use of his right hand. Regarding  his dual antiplatelet therapy, he remains on aspirin and Plavix. He denies any  hematemesis, hematochezia or hematuria. He does report some nuisance bleeding,  but otherwise no other major concerns. He is currently on Crestor 40 mg once  daily. His last lipid profile reveals a total cholesterol of 189 mg/dL,  triglyceride level of 496 mg/dL, HDL level of 29 mg/dL and an LDL of 161. He  has not had a subsequent lipid profile after initiation of statin therapy.  Regarding a diagnostic plan, we plan to evaluate his lipid profile by  obtaining a lipid panel after today's office visit.    Regarding his hypertension, he is currently on Toprol-XL and lisinopril 20. He  still remains hypertensive at today's office visit with a blood pressure of  138/81. We suggest increasing his lisinopril from 20 mg to 40 mg and  continuing with Toprol-XL at 25 mg. Regarding his dyslipidemia, we suggest  that he continue with Crestor 40 mg once nightly and we will reevaluate the  need for additional therapy after looking at his lipid panel.    Upon evaluation examination, he does have an auscultatory diastolic murmur and  we would like to evaluate his cardiac structure and function with an  echocardiogram. We plan to see him back in the office in 3 months to review  the diagnostic testing recommended.    Thank you very much for allowing our team to participate in the care of Dr.  Elesa Massed. Should there be any questions regarding his care, please do not hesitate  to contact the office.    ---       **Treatment**    ---       **1\. Coronary artery disease involving native coronary artery of native  heart without angina pectoris**    Start Clopidogrel Bisulfate Tablet, 75 mg, 1 tablet, Orally, Once a day,  90  days, 90 Tablet, Refills 3    Start Lisinopril Tablet, 40 mg, 1 tablet, Orally, Once a day, 90 days, 90  Tablet, Refills 3    _LAB: CPK+CKMB (CKMB)_    _LAB: LIPID PANEL_    _LAB: Liver Function Tests Panel (LFTP)_    _IMAGING: Echo (2D)_    Notes: To have fasting blood work done.    ---      **Follow Up**    ---    3 Months    **Appointment Provider:** Cherylin Mylar    Electronically signed by Frances Maywood , MD on 01/12/2018 at 03:33 PM EDT    Sign off status: Completed        * * *        Cardiovascular Clinic    [address]    [address]    [place], [place] [address]    Tel: [phone]    Fax: [phone]              * * *          Patient: MOHMMAD, SALEEBY DOB: 03/06/1966 Progress Note: Amber Guthridge  11/23/2017    ---    Note generated by eClinicalWorks EMR/PM Software ([email or web address])

## 2017-11-24 ENCOUNTER — Ambulatory Visit

## 2017-11-24 NOTE — Progress Notes (Signed)
Miscellaneous Note    Cards is suggesting doubling my Lisinopril.  They worry about hydrocholorothiazide and my stone issues.  he suggested if it doesn't respond to the increased dose a calcium channel blocker.    i'm enclosing my BP and weight over the last 6 weeks..  I'll have more data next time I see you.    Brandon [name]    On Nov 24, 2017, at 11:36 AM, [name] [email or web address]> wrote:  >   > Hi Brandon [name],  > I typed and ? sent a response yest but it doesn't appear to have been sent.  Hmm. Not sure why.  > Your Excel spreadsheet is difficult to interpret as the dates do not align. I am fine with your increasing the lisinopril to hopefully get consistently better BP readings,  and I am quite pleased to see your weight coming down.  > Brandon [name]    I got it....  sorry about the data.      Brandon [name] thinks I might have AI, so he's getting an echo.      Now I definitely need to double my lisinopril...  :(    I'll make an appt with [facility] and an eye appointment     Thanks!  Brandon [name]                Allergies not determined    Plan   Medication List:   LISINOPRIL 20 MG ORAL TABLET (LISINOPRIL) Take one tablet by mouth once daily  CRESTOR 40 MG ORAL TABLET (ROSUVASTATIN CALCIUM) Take one tablet by mouth once daily.; Route: ORAL  PLAVIX 75 MG ORAL TABLET (CLOPIDOGREL BISULFATE) one by mouth daily; Route: ORAL  ASPIRIN 81 MG ORAL TABLET (ASPIRIN) Take one tablet by mouth once daily  TAMSULOSIN HCL 0.4 MG ORAL CAPSULE (TAMSULOSIN HCL) Take one capsule by mouth once daily; Route: ORAL  RA SAW PALMETTO 160 MG ORAL CAPSULE (SAW PALMETTO (SERENOA REPENS)) ; Route: ORAL                  Created By Wynonia Lawman MD on 11/24/2017 at 01:59 PM    Electronically Signed By Wynonia Lawman MD on 11/24/2017 at 02:00 PM

## 2017-12-01 ENCOUNTER — Ambulatory Visit

## 2017-12-19 ENCOUNTER — Ambulatory Visit (HOSPITAL_BASED_OUTPATIENT_CLINIC_OR_DEPARTMENT_OTHER): Admitting: Optometrist

## 2017-12-19 ENCOUNTER — Ambulatory Visit

## 2017-12-19 ENCOUNTER — Ambulatory Visit: Admitting: Optometrist

## 2017-12-23 ENCOUNTER — Ambulatory Visit

## 2017-12-23 ENCOUNTER — Ambulatory Visit: Admitting: Cardiovascular Disease

## 2018-02-06 ENCOUNTER — Ambulatory Visit

## 2018-02-06 NOTE — Progress Notes (Signed)
Division of General Medicine - Future Orders      Requested tests to be done: __(date)____________________________        Orders     LYME C6 Antibody Total [LOV-56433]        Special orders:        Created By Wynonia Lawman, MD - [facility] on 02/06/2018 at 04:37 PM    Electronically Signed By Wynonia Lawman, MD - [facility] on 02/06/2018 at 04:37 PM

## 2018-02-13 ENCOUNTER — Ambulatory Visit: Admitting: Optometrist

## 2018-02-15 ENCOUNTER — Ambulatory Visit (HOSPITAL_BASED_OUTPATIENT_CLINIC_OR_DEPARTMENT_OTHER): Admitting: Optometrist

## 2018-02-15 ENCOUNTER — Ambulatory Visit: Admitting: Optometrist

## 2018-02-15 ENCOUNTER — Ambulatory Visit

## 2018-02-27 ENCOUNTER — Ambulatory Visit

## 2018-02-27 NOTE — Telephone Encounter (Signed)
Call Details:   Pt was seen recently at local ED for recurrent renal stone.  Scan showed slightly enlarged left renal mass (seen on previous scans).  He is requesting repeat MRI here at [facility], .......................................Marland KitchenWynonia Lawman, MD - [facility]  February 27, 2018 10:57 AM        RESPONSE/ORDERS:    Lorin Picket  - please put in order for MRI on this pt.  He already has it set up for tomorrow.. thx.......................................Marland KitchenWynonia Lawman, MD - [facility]  February 27, 2018 10:52 AM               ORDERS/PROBS/MEDS/ALL   New Orders:   Other [CPT-00000]    Problems:   RENAL MASS (ICD-593.9) (ICD10-N28.89)  ARTHRALGIA (ICD-719.48) (ICD10-M25.50)  CORONARY ARTERY DISEASE (ICD-414.00) (ICD10-I25.10)  HYPERTENSION (ICD-401.9) (ICD10-I10)  URINARY FREQUENCY (ICD-788.41) (ICD10-R35.0)  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-N20.0)  RENAL INSUFFICIENCY (ICD-593.9) (ICD10-N28.9)  HYPERLIPIDEMIA (ICD-272.4) (ICD10-E78.5)  HEMATURIA, MICROSCOPIC (ICD-599.72) (ICD10-R31.2)  ACROCHORDON (ICD-701.9) (ICD10-L91.8)  OBESITY (ICD-278.01) (ICD10-E66.01)  SNORING (ICD-786.09) (ICD10-R06.83)  IRRITABLE BOWEL SYNDROME (ICD-564.1) (ICD10-K58.9)    Meds (prior to this call):   LISINOPRIL 20 MG ORAL TABLET (LISINOPRIL) Take one tablet by mouth once daily  CRESTOR 40 MG ORAL TABLET (ROSUVASTATIN CALCIUM) Take one tablet by mouth once daily.; Route: ORAL  PLAVIX 75 MG ORAL TABLET (CLOPIDOGREL BISULFATE) one by mouth daily; Route: ORAL  ASPIRIN 81 MG ORAL TABLET (ASPIRIN) Take one tablet by mouth once daily  TAMSULOSIN HCL 0.4 MG ORAL CAPSULE (TAMSULOSIN HCL) Take one capsule by mouth once daily; Route: ORAL  RA SAW PALMETTO 160 MG ORAL CAPSULE (SAW PALMETTO (SERENOA REPENS)) ; Route: ORAL    Changes to Meds (this update):   Added new medication of LORAZEPAM 0.5 MG ORAL TABLET (LORAZEPAM) one by mouth 30 min prior to MRI. May repeat x 1; Route: ORAL - Signed  Rx of LORAZEPAM 0.5 MG ORAL TABLET (LORAZEPAM) one by mouth 30 min prior to MRI.  May repeat x 1; Route: ORAL  #4 x 0;  Signed;  Entered by: Wynonia Lawman, MD - [facility];  Authorized by: [name], MD;  Method used: Printed then faxed to [facility] - [address], [place], [place]  , Ph: [phone], Fax: [phone]; Note to Pharmacy: Route: ORAL;          Created By Wynonia Lawman, MD - [facility] on 02/27/2018 at 10:50 AM    Electronically Signed By Wynonia Lawman, MD - [facility] on 03/03/2018 at 12:36 PM

## 2018-02-28 ENCOUNTER — Ambulatory Visit: Admitting: Internal Medicine

## 2018-03-03 ENCOUNTER — Ambulatory Visit

## 2018-03-03 ENCOUNTER — Ambulatory Visit: Admitting: Internal Medicine

## 2018-03-03 LAB — HX CHEM-PANELS
HX BLOOD UREA NITROGEN: 37 mg/dL — ABNORMAL HIGH (ref 6–24)
HX CREATININE (CR): 1.35 mg/dL — ABNORMAL HIGH (ref 0.57–1.30)
HX GFR, AFRICAN AMERICAN: 70 mL/min/{1.73_m2}
HX GFR, NON-AFRICAN AMERICAN: 60 mL/min/{1.73_m2}

## 2018-03-03 NOTE — Progress Notes (Signed)
Division of General Medicine - Future Orders      Requested tests to be done: __(date)____________________________        Orders     Blood Urea Nitrogen (BUN) [CPT-84520]  Creatinine (CR) [CPT-82565]        Special orders:        Created By Wynonia Lawman, MD - [facility] on 03/03/2018 at 09:52 AM    Electronically Signed By Wynonia Lawman, MD - [facility] on 03/03/2018 at 09:52 AM

## 2018-03-03 NOTE — Progress Notes (Signed)
Orders     Phlebotomy/venipuncture (5101) - done in [facility] [CPT-36415]  done in pdc, .......................................Clydia Llano  March 03, 2018 2:32 PM        Created By Clydia Llano on 03/03/2018 at 02:31 PM    Electronically Signed By Kathreen Cosier on 03/06/2018 at 09:32 AM

## 2018-03-08 ENCOUNTER — Ambulatory Visit

## 2018-03-10 ENCOUNTER — Ambulatory Visit

## 2018-03-10 ENCOUNTER — Ambulatory Visit: Admitting: Internal Medicine

## 2018-03-16 ENCOUNTER — Ambulatory Visit

## 2018-03-16 NOTE — Progress Notes (Signed)
Miscellaneous Note        Hx atopic dermatitis. A few days of itchy rash crook of both elbows. Both crooks elbows with area of mostly confluent macular erythema. WIll treat with triaminolong crean          Allergies not determined    Plan   New Prescriptions/Refills:  TRIAMCINOLONE ACETONIDE 0.025 % EXTERNAL CREAM (TRIAMCINOLONE ACETONIDE) Apply to affected area bid  #60[Gram] x 0, 03/16/2018, Tomma Rakers, MD    Medication List:   LISINOPRIL 20 MG ORAL TABLET (LISINOPRIL) Take one tablet by mouth once daily  CRESTOR 40 MG ORAL TABLET (ROSUVASTATIN CALCIUM) Take one tablet by mouth once daily.; Route: ORAL  PLAVIX 75 MG ORAL TABLET (CLOPIDOGREL BISULFATE) one by mouth daily; Route: ORAL  ASPIRIN 81 MG ORAL TABLET (ASPIRIN) Take one tablet by mouth once daily  TAMSULOSIN HCL 0.4 MG ORAL CAPSULE (TAMSULOSIN HCL) Take one capsule by mouth once daily; Route: ORAL  RA SAW PALMETTO 160 MG ORAL CAPSULE (SAW PALMETTO (SERENOA REPENS)) ; Route: ORAL  LORAZEPAM 0.5 MG ORAL TABLET (LORAZEPAM) one by mouth 30 min prior to MRI. May repeat x 1; Route: ORAL  TRIAMCINOLONE ACETONIDE 0.025 % EXTERNAL CREAM (TRIAMCINOLONE ACETONIDE) Apply to affected area bid; Route: EXTERNAL          Prescriptions:  TRIAMCINOLONE ACETONIDE 0.025 % EXTERNAL CREAM (TRIAMCINOLONE ACETONIDE) Apply to affected area bid  #60[Gram] x 0   Route:EXTERNAL   Entered and Authorized by: Tomma Rakers, MD   Signed by: Tomma Rakers, MD on 03/16/2018   Method used: Electronically to      [facility] [address] (retail)     [address]     [place], [place]       Ph: [phone]     Fax: [phone]   Note to Pharmacy: Route: EXTERNAL;    RxID: [id number]            Created By Tomma Rakers MD on 03/16/2018 at 11:28 AM    Electronically Signed By Tomma Rakers MD on 03/16/2018 at 11:36 AM

## 2018-03-20 ENCOUNTER — Ambulatory Visit

## 2018-03-20 NOTE — Progress Notes (Signed)
Miscellaneous Note    From: WardShunsuke, Granzow   Sent: Monday, March 20, 2018 1:50 PM  To: Frances Maywood; [name]  Cc: [email or web address]  Subject: Good News/Bad News    Iona Hansen,  Good news is that I've been completely asymptomatic regarding my stent from late January.  Even lost about 6 pounds reducing overall food volume and fat intake.    Bad news is that I have a 4cm renal mass that has to come out.  The guys at [facility], I'm CC'ing my urologist who has been following me for recurrent renal stones, are thinking partial nephrectomy via a robotic surgery.  So, with respect to the my plavix/baby aspirin urology wants to know with what temporary reversal strategy you'd feel most comfortable.      Mildly terrified,  [name]    [name], MD, CCD, DABR    Hi [name],    Sorry for the news. I am confident that all will go well for you with the expert team caring for you.  As relates to your anti-platelet medication, I would go off-guideline here. After 3 months your drug-eluting stent is basically a bare metal stent. Obviously, there are no guarantees, but, I would think it is safe to discontinue your Plavix for 1 week prior to surgery. I would favor continuing baby aspirin throughout if your surgical team is not opposed to this.    Keep in touch and best wishes.    Royden Purl, M.D.                Allergies not determined    Plan   Medication List:   LISINOPRIL 20 MG ORAL TABLET (LISINOPRIL) Take one tablet by mouth once daily  CRESTOR 40 MG ORAL TABLET (ROSUVASTATIN CALCIUM) Take one tablet by mouth once daily.; Route: ORAL  PLAVIX 75 MG ORAL TABLET (CLOPIDOGREL BISULFATE) one by mouth daily; Route: ORAL  ASPIRIN 81 MG ORAL TABLET (ASPIRIN) Take one tablet by mouth once daily  TAMSULOSIN HCL 0.4 MG ORAL CAPSULE (TAMSULOSIN HCL) Take one capsule by mouth once daily; Route: ORAL  RA SAW PALMETTO 160 MG ORAL CAPSULE (SAW PALMETTO (SERENOA REPENS)) ; Route: ORAL  LORAZEPAM 0.5 MG ORAL TABLET  (LORAZEPAM) one by mouth 30 min prior to MRI. May repeat x 1; Route: ORAL  TRIAMCINOLONE ACETONIDE 0.025 % EXTERNAL CREAM (TRIAMCINOLONE ACETONIDE) Apply to affected area bid; Route: EXTERNAL                  Created By Wynonia Lawman, MD - [facility] on 05/03/2018 at 12:25 PM    Electronically Signed By Wynonia Lawman, MD - [facility] on 05/03/2018 at 12:26 PM

## 2018-03-29 ENCOUNTER — Ambulatory Visit

## 2018-04-18 ENCOUNTER — Ambulatory Visit

## 2018-04-18 NOTE — Progress Notes (Signed)
[facility] April 18, 2018  [address]   [place]  [place] [address]  Main: [phone]  Fax: [phone]  Patient Portal: [email or web address]              Re: JOSEARMANDO KUHNERT Cathey  [address]  [place], [place]  [address]                       DOB: Sep 30, 1965    To: [facility]/Radiology    Fax: [phone]    Please perform a  renal contrast enhanced ultrasound on Dr Elesa Massed and fax the report to my attention at [phone]. He has a left renal mass noted on MRI.    Dx: N28.89      Please call me if you have any questions.        Sincerely,      Tomma Rakers, MD  [facility]  [email or web address]      Created By Tomma Rakers MD on 04/18/2018 at 10:04 AM    Electronically Signed By Tomma Rakers MD on 04/18/2018 at 10:04 AM

## 2018-04-25 ENCOUNTER — Ambulatory Visit

## 2018-04-25 NOTE — Progress Notes (Signed)
[facility] April 25, 2018  [address]  [place] [address]  Main: [phone]  Fax:   Patient Portal: [name]  [address]  [place], [place]  [address]    MR#: [id number], DOB: 1965/12/28    Dear  [name],      Your clinician ordered a colonoscopy test and we have not seen this test yet completed.    Colonoscopies are important tests in screening for colon cancer in those without symptoms or finding problems when patients have bowel symptoms.    Please call our Endoscopy Department at [phone] to schedule this important test.    If you already have an upcoming test already scheduled, we will await these test results and no further call is necessary.        Sincerely,     Your Care Team at [facility]      If this letter is in error, we do apologize.            Created By Reather Laurence on 04/25/2018 at 11:48 AM    Electronically Signed By Reather Laurence on 04/25/2018 at 11:48 AM

## 2018-06-05 ENCOUNTER — Ambulatory Visit

## 2018-06-05 ENCOUNTER — Ambulatory Visit: Admitting: Internal Medicine

## 2018-06-05 LAB — HX BF-URINALYSIS
HX KETONES: NEGATIVE mg/dL
HX LEUKOCYTE ES: NEGATIVE
HX NITRITE LEVEL: NEGATIVE
HX RED BLOOD CELLS: >100 /HPF — AB
HX SPECIFIC GRAVITY: 1.018
HX U BILIRUBIN: NEGATIVE
HX U GLUCOSE: NEGATIVE mg/dL
HX U PH: 6
HX U UROBILINIG: 0.2 EU
HX WHITE BLOOD CELLS: 5 /HPF

## 2018-06-05 NOTE — Progress Notes (Signed)
Division of General Medicine - Future Orders      Requested tests to be done: __(date)____________________________        Orders     Other/Misc Orders [id number]        Special orders:        Created By Wynonia Lawman, MD - [facility] on 06/05/2018 at 09:43 AM    Electronically Signed By Wynonia Lawman, MD - [facility] on 06/05/2018 at 09:46 AM

## 2018-06-05 NOTE — Progress Notes (Signed)
Division of General Medicine - Future Orders      Requested tests to be done: __(date)____________________________        Orders     Urinalysis with Reflex to Culture [UA COLOR] [CPT-81001]        Special orders:        Created By Wynonia Lawman, MD - [facility] on 06/05/2018 at 09:28 AM    Electronically Signed By Wynonia Lawman, MD - [facility] on 06/05/2018 at 09:29 AM

## 2018-06-07 LAB — BMP (EXT)
Anion Gap (EXT): 11 mmol/L (ref 2–15)
BUN (EXT): 25 mg/dL — ABNORMAL HIGH (ref 7–24)
CO2 (EXT): 23 mmol/L — ABNORMAL LOW (ref 24–32)
CalciumCalcium (EXT): 9.1 mg/dL (ref 8.5–10.5)
Chloride (EXT): 106 mmol/L (ref 98–110)
Creatinine (EXT): 2.1 mg/dL — ABNORMAL HIGH (ref 0.6–1.3)
Glucose (EXT): 140 mg/dL — ABNORMAL HIGH (ref 70–118)
Potassium (EXT): 3.8 mmol/L (ref 3.4–5.2)
Sodium (EXT): 140 mmol/L (ref 135–146)
eGFR - Creat MDRD (EXT): 33 mL/min/BSA — ABNORMAL LOW (ref >=60)
eGFR - Creat MDRD (EXT): 41 mL/min/BSA — ABNORMAL LOW (ref >=60)

## 2018-06-07 LAB — HX CYTO GYN & NON GYN

## 2018-06-09 LAB — UNMAPPED LAB RESULTS

## 2018-06-10 LAB — UNMAPPED LAB RESULTS

## 2018-06-12 ENCOUNTER — Ambulatory Visit: Admitting: Hand Surgery

## 2018-06-12 ENCOUNTER — Ambulatory Visit

## 2018-06-12 NOTE — Progress Notes (Signed)
* * *        **[name], [name]**    --- ---    52 Y old Male, DOB: 1966/07/15, External MRN: [id number]    Account Number: [id number]    [name], [place]    Home: [phone]    Guarantor: Brandon [name], Brandon [name] Insurance: [employer]    PCP: Brandon Gilles, MD Referring: Brandon Gilles, MD    Appointment Facility[facility]        * * *    06/12/2018  Progress Notes: Yetta Numbers, MD **CHN#:** [phone]    --- ---    ---         **Reason for Appointment**    ---       1\. 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 unable to extend DIP.       **Current Medications**    ---    Taking     * Clopidogrel Bisulfate 75 mg Tablet 1 tablet Orally Once a day    ---    * Crestor 40 MG Tablet 1 tablet Orally Once a day    ---    * Lisinopril 40 mg Tablet 1 tablet Orally Once a day    ---    * Loperamide A-D 2 MG Tablet 1 tablet Orally after each loose bowel movement not to exceed 8 tablets a day    ---    Not-Taking/PRN    * Azithromycin 500 MG Tablet 1 tablet Orally once a day for up to 3 days if diarrhea (may repeat if needed)    ---    * Lisinopril 20 MG Tablet 1 tablet Orally Once a day    ---    * PredniSONE 20 MG Tablet 2 tablets Orally Once a day    ---    * Simvastatin Tablet 40 mg Take 1 tablet once a day by mouth once a day    ---    * Vivotif Berna Vaccine . Capsule Delayed Release 1 Orally every other day on an empty stomach; complete 1 week prior to departure; complete at least 1 week before any antibiotics    ---    * Medication List reviewed and reconciled with the patient    ---       **Past Medical History**    ---       Hypertension.        ---    Hypercholesterolemia.        ---    Renal stones.        ---    Renal insufficiency.        ---       **Surgical History**    ---       No Surgical History  documented.    ---       **Family History**    ---       Mother: alive, hypertension, diagnosed with Test    ---    Father: cabg; cad, Test    ---    Siblings: sister healthy    ---    no personal or fh skin cancer.    ---       **Social  History**    ---    Work/Occupation: employed full-time.    Alcohol Socially.    Illicit drugs: Denies.    Tobacco history: Never smoked.   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    ---       **Allergies**    ---       Penicillin: anaphylaxis - Allergy    ---       **Hospitalization/Major Diagnostic Procedure**    ---       No Hospitalization History.    ---       **Review of Systems**    ---     _ORT_ :    Eyes No. Ear, Nose Throat No. Digestion, Stomach, Bowel No. Bladder Problems  No. Bleeding Problems No. Numbness/Tingling No. Anxiety/Depression No.  Fever/Chills/Fatigue No. Chest Pain/Tightness/Palpitations No. Skin Rash No.  Dental Problems No. Joint/Muscle Pain/Cramps Yes. Blackout/Fainting No. Other  No.          **Vital Signs**    ---    Pain scale 2, Ht-in 68.       **Physical Examination**    ---    apears healthy. skin intact. no swelling. 40 degree droop DIP right middle  finger with mild local tenderness. sensation intact to light touch, fingers  pink and warm with good cap refill. full passive extension.    Xrays of right middle finger taken today, reviewed by me demonstrate no  fracture.       **Assessments**    ---    1\. Mallet finger of right hand - M20.011 (Primary)    ---       **Treatment**    ---       **1\. Mallet finger of right hand**    Notes: nature of condition and treatment options reviewed. placed into a DIP  splint. he does not feel that he will be able to function at work with splint  and would like to proceed with pinning. Risks including but not limited to  infection,nerve damage, stiffness, recurrent droop, need for hardware removal  explained. He would like to proceed.    ---      **Follow Up**    ---    surgery     Electronically signed by Yetta Numbers , MD on 06/12/2018 at 10:44 AM EDT    Sign off status: Completed        * * *        [facility]    [address]    [place], [address]    [place], [place] [address]    Tel: [phone]    Fax: [phone]              * * *          Patient: Brandon, [name] DOB: Oct 27, 1965 Progress Note: Yetta Numbers, MD  06/12/2018    ---    Note generated by eClinicalWorks EMR/PM Software ([email or web address])

## 2018-06-12 NOTE — Progress Notes (Signed)
.  Progress Notes  .  Patient: Brandon [name], Brandon [name]  Provider: Yetta Numbers    .  DOB: 1965-09-12 Age: 52 Y Sex: Male  .  PCP: [name] MD  Date: 06/12/2018  .  --------------------------------------------------------------------------------  .  REASON FOR APPOINTMENT  .  1. BL MALLET FINGERPER CASSIDY  .  HISTORY OF PRESENT ILLNESS  .  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  minor injury last night while making bed. unable to extend DIP.  Marland Kitchen  CURRENT MEDICATIONS  .  Taking Clopidogrel Bisulfate 75 mg Tablet 1 tablet Orally Once a  day  Taking Crestor 40 MG Tablet 1 tablet Orally Once a day  Taking Lisinopril 40 mg Tablet 1 tablet Orally Once a day  Taking Loperamide A-D 2 MG Tablet 1 tablet Orally after each  loose bowel movement not to exceed 8 tablets a day  Not-Taking/PRN Azithromycin 500 MG Tablet 1 tablet Orally once a  day for up to 3 days if diarrhea (may repeat if needed)  Not-Taking/PRN Lisinopril 20 MG Tablet 1 tablet Orally Once a day  Not-Taking/PRN PredniSONE 20 MG Tablet 2 tablets Orally Once a  day  Not-Taking/PRN Simvastatin Tablet 40 mg Take 1 tablet once a day  by mouth once a day  Not-Taking/PRN Vivotif Berna Vaccine . Capsule Delayed Release 1  Orally every other day on an empty stomach; complete 1 week prior  to departure; complete at least 1 week before any antibiotics  Medication List reviewed and reconciled with the patient  .  PAST MEDICAL HISTORY  .  Hypertension  Hypercholesterolemia  Renal stones  Renal insufficiency  .  ALLERGIES  .  Penicillin: anaphylaxis - Allergy  .  SURGICAL HISTORY  .  No Surgical History documented.  Marland Kitchen  FAMILY HISTORY  .  Mother: alive, hypertension, diagnosed with Test  Father: cabg; cad, Test  Siblings: sister healthy  no personal or fh skin cancer.  .  SOCIAL HISTORY  .  Marland Kitchen  Work/Occupation: employed full-time.  .  Alcohol  Socially.  .  Illicit drugs: Denies.  .  Tobacco  history: Never smoked.  .  Patient is married.  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  .  HOSPITALIZATION/MAJOR DIAGNOSTIC PROCEDURE  .  No Hospitalization History.  Marland Kitchen  REVIEW OF SYSTEMS  .  ORT:  .  Eyes    No . Ear, Nose Throat    No . Digestion, Stomach, Bowel     No . Bladder Problems    No . Bleeding Problems    No .  Numbness/Tingling    No . Anxiety/Depression    No .  Fever/Chills/Fatigue    No . Chest Pain/Tightness/Palpitations     No . Skin Rash    No . Dental Problems    No . Joint/Muscle  Pain/Cramps    Yes . Blackout/Fainting    No . Other    No .  .  VITAL SIGNS  .  Pain scale 2, Ht-in 68.  Marland Kitchen  PHYSICAL EXAMINATION  .  apears healthy. skin intact. no swelling. 40 degree droop DIP  right middle finger with mild local tenderness. sensation intact  to light touch, fingers pink and warm with good cap refill. full  passive extension.Xrays of right middle finger taken today,  reviewed by me demonstrate no fracture.  .  ASSESSMENTS  .  Mallet finger of right hand - M20.011 (Primary)  .  TREATMENT  .  Mallet finger of right hand  Notes: nature of condition and treatment options reviewed. placed  into a DIP splint. he does not feel that he will be able to  function at work with splint and would like to proceed with  pinning. Risks including but not limited to infection,nerve  damage, stiffness, recurrent droop, need for hardware removal  explained. He would like to proceed.  .  FOLLOW UP  .  surgery  .  Electronically signed by Yetta Numbers , MD on  06/12/2018 at 10:44 AM EDT  .  Document electronically signed by Yetta Numbers    .

## 2018-06-13 ENCOUNTER — Ambulatory Visit

## 2018-06-15 ENCOUNTER — Ambulatory Visit

## 2018-06-15 ENCOUNTER — Ambulatory Visit: Admitting: Hand Surgery

## 2018-06-15 NOTE — Op Note (Signed)
Patient    Brandon [name], Brandon [name]            Med Rec #:  [id number]  Name:  Operation  06/15/2018                Pt.  Dt:                                  Location:  .  Marland Kitchen                               OPERATIVE REPORT  .  Marland Kitchen  PREOPERATIVE DIAGNOSIS:  Mallet finger, right middle finger.  Marland Kitchen  POSTOPERATIVE DIAGNOSIS:  Mallet finger, right middle finger.  Marland Kitchen  PROCEDURE:  Closed manipulation and pinning, right middle mallet finger.  .  SURGEON:  Yetta Numbers, M.D.  .  ASSISTANT:  Vivi Barrack, M.D.  .  ANESTHESIA:  Local.  .  COMPLICATIONS:  None.  .  ESTIMATED BLOOD LOSS:  Minimal.  .  TOURNIQUET TIME:  Zero minutes.  .  INDICATIONS:  He sustained a right middle mallet finger.  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  Risks of the procedure including, but not limited to  infection, nerve damage, stiffness, recurrent droop, and need for further  surgery were explained to the patient preoperatively, who wished to  proceed.  .  DESCRIPTION OF PROCEDURE:  In the holding area, a webspace block was  performed under sterile condition using 1% mepivacaine.  In the operating  room, the patient was placed in supine position on the operating room  table.  The right upper extremity was prepped and draped in a sterile  fashion.  A 0.062 inch K-wire was advanced retrograde from the tip of the  right middle finger into the tuft of the distal phalanx.  Proper alignment  was confirmed fluoroscopically.  The distal interphalangeal joint was  maintained in full extension and the pin was advanced retrograde across the  interphalangeal joint into the middle phalanx into the subchondral bone at  the proximal aspect of the middle phalanx.  The pin was withdrawn slightly,  cut and tapped beneath the skin.  Proper alignment was confirmed  fluoroscopically.  Dermabond was applied.  Additional anesthetic was placed  at  the webspace using 2% mepivacaine.  A sterile dressing was applied.  The  patient tolerated the procedure well and was discharged to the recovery  room in good condition.  .  .  .  Electronically Signed  Yetta Numbers, MD 06/20/2018 06:57 A  .  .  Marland Kitchen  Dictated byYetta Numbers, MD  .  D:    06/19/2018  T:    06/19/2018 05:59 P  Dictation ID:  [id number]/Doc#  [id number]  .  cc:  .  Marland Kitchen      Document is preliminary until electronically or manually signed by                             attending physician.

## 2018-06-23 ENCOUNTER — Ambulatory Visit: Admitting: Hand Surgery

## 2018-06-23 ENCOUNTER — Ambulatory Visit

## 2018-06-23 NOTE — Progress Notes (Signed)
* * *        **Brandon [name], Brandon [name]**    --- ---    2 Y old Male, DOB: 11/14/1965, External MRN: [id number]    Account Number: [id number]    [address], [address] [name], [place]    Home: [phone]    Guarantor: [name], [name] Insurance: [employer] PPO    PCP: [name], MD Referring: [name], MD    [facility]ment Facility[facility]        * * *    06/23/2018   **Appointment Provider:** Jackalyn Lombard, MD **CHN#:**  [phone]    --- ---      **Supervising Provider:** Yetta Numbers, MD    ---         **Reason for Appointment**    ---       1\. 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  Reports his pain is well controlled, has a little bit of tenderness over the  dorsal aspect of the middle phalanx, has been able to return to work and has  minimal complaints. His work note was completed in the office.       **Current Medications**    ---    Taking     * Clopidogrel Bisulfate 75 mg Tablet 1 tablet Orally Once a day    ---    * Crestor 40 MG Tablet 1 tablet Orally Once a day    ---    * Lisinopril 40 mg Tablet 1 tablet Orally Once a day    ---    * Loperamide A-D 2 MG Tablet 1 tablet Orally after each loose bowel movement not to exceed 8 tablets a day    ---    Not-Taking/PRN    * Azithromycin 500 MG Tablet 1 tablet Orally once a day for up to 3 days if diarrhea (may repeat if needed)    ---    * Lisinopril 20 MG Tablet 1 tablet Orally Once a day    ---    * PredniSONE 20 MG Tablet 2 tablets Orally Once a day    ---    * Simvastatin Tablet 40 mg Take 1 tablet once a day by mouth once a day    ---    * Vivotif Berna Vaccine . Capsule Delayed Release 1 Orally every other day on an empty stomach; complete 1 week prior to departure; complete at least 1 week before any antibiotics    ---    * Medication List reviewed and reconciled with the patient     ---       **Past Medical History**    ---       Hypertension.        ---    Hypercholesterolemia.        ---    Renal stones.        ---    Renal insufficiency.        ---       **Surgical History**    ---       Closed manipulation and pinning, right middle mallet finger. 06/15/2018    ---       **Family History**    ---  Mother: alive, hypertension, diagnosed with Test    ---    Father: cabg; cad, Test    ---    Siblings: sister healthy    ---    no personal or fh skin cancer.    ---       **Social History**    ---    Work/Occupation: employed full-time.    Alcohol Socially.    Illicit drugs: Denies.    Tobacco history: Never smoked.   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    ---       **Allergies**    ---       Penicillin: anaphylaxis - Allergy    ---       **Hospitalization/Major Diagnostic Procedure**    ---       No Hospitalization History.    ---       **Review of Systems**    ---     _ORT_ :    Eyes No. Ear, Nose Throat No. Digestion, Stomach, Bowel No. Bladder Problems  No. Bleeding Problems No. Numbness/Tingling No. Anxiety/Depression No.  Fever/Chills/Fatigue No. Chest Pain/Tightness/Palpitations No. Skin Rash No.  Dental Problems No. Joint/Muscle Pain/Cramps Yes. Blackout/Fainting No. Other  No.          **Vital Signs**    ---    Pain scale 0, Ht-in 68.       **Physical Examination**    ---    Right hand is neurovascularly intact. He has got no DIP flexion or extension  of the middle finger. Sensation intact radial, ulnar and median nerve  distributions. He has brisk capillary refill in all 5 fingers and fires EDC,  FDS, FDP, EPL, FPL, and interossei to all the fingers. There is a pinpoint  scab over the pin insertion site on the middle finger. He is a little tender  over the tip of the middle finger.       **Assessments**    ---    1\. Mallet finger of right hand - M20.011 (Primary)    ---       **Treatment**    ---       **1\. Others**    Notes: Plan for the postoperative  course was discussed. His questions were  answered. He was seen with Dr. Rodman Pickle. Pin will stay in for 2-3 months and be  taken out in the OR. Postoperatively, he can expect the swelling over the  middle phalanx to reduce the time and he can continue working as tolerated. He  can follow up in 2 months.    ---      **Follow Up**    ---    2 Months    **Appointment Provider:** Jackalyn Lombard, MD    Electronically signed by Yetta Numbers , MD on 08/04/2018 at 09:33 AM EST    Sign off status: Completed        * * *        [facility]    [address]    [place], [address]    [place], [place] [address]    Tel: [phone]    Fax: [phone]              * * *          Patient: Brandon [name], Brandon [name] DOB: 05-05-1966 Progress Note: Jackalyn Lombard,  MD 06/23/2018    ---    Note generated by eClinicalWorks EMR/PM Software ([email or web address])

## 2018-06-23 NOTE — Progress Notes (Signed)
.  Progress Notes  .  Patient: Brandon [name], Brandon [name]  Provider: Charyl Bigger MD  .DOB: June 21, 1966 Age: 52 Y Sex: Male  Supervising Provider:: Yetta Numbers, MD  Date: 06/23/2018  .  PCP: [name] MD  Date: 06/23/2018  .  --------------------------------------------------------------------------------  .  REASON FOR APPOINTMENT  .  1. POST OP RT MF PINNING  .  HISTORY OF PRESENT ILLNESS  .  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 Reports his pain is well  controlled, has a little bit of tenderness over the dorsal aspect  of the middle phalanx, has been able to return to work and has  minimal complaints. His work note was completed in the office.  .  CURRENT MEDICATIONS  .  Taking Clopidogrel Bisulfate 75 mg Tablet 1 tablet Orally Once a  day  Taking Crestor 40 MG Tablet 1 tablet Orally Once a day  Taking Lisinopril 40 mg Tablet 1 tablet Orally Once a day  Taking Loperamide A-D 2 MG Tablet 1 tablet Orally after each  loose bowel movement not to exceed 8 tablets a day  Not-Taking/PRN Azithromycin 500 MG Tablet 1 tablet Orally once a  day for up to 3 days if diarrhea (may repeat if needed)  Not-Taking/PRN Lisinopril 20 MG Tablet 1 tablet Orally Once a day  Not-Taking/PRN PredniSONE 20 MG Tablet 2 tablets Orally Once a  day  Not-Taking/PRN Simvastatin Tablet 40 mg Take 1 tablet once a day  by mouth once a day  Not-Taking/PRN Vivotif Berna Vaccine . Capsule Delayed Release 1  Orally every other day on an empty stomach; complete 1 week prior  to departure; complete at least 1 week before any antibiotics  Medication List reviewed and reconciled with the patient  .  PAST MEDICAL HISTORY  .  Hypertension  Hypercholesterolemia  Renal stones  Renal insufficiency  .  ALLERGIES  .  Penicillin: anaphylaxis - Allergy  .  SURGICAL HISTORY  .  Closed manipulation and pinning, right middle mallet finger.  06/15/2018  .  FAMILY HISTORY  .  Mother: alive, hypertension,  diagnosed with Test  Father: cabg; cad, Test  Siblings: sister healthy  no personal or fh skin cancer.  .  SOCIAL HISTORY  .  Marland Kitchen  Work/Occupation: employed full-time.  .  .  Alcohol  Socially.  .  .  Illicit drugs: Denies.  .  .  Tobacco  history: Never smoked.  .  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  .  HOSPITALIZATION/MAJOR DIAGNOSTIC PROCEDURE  .  No Hospitalization History.  Marland Kitchen  REVIEW OF SYSTEMS  .  ORT:  .  Eyes    No . Ear, Nose Throat    No . Digestion, Stomach, Bowel     No . Bladder Problems    No . Bleeding Problems    No .  Numbness/Tingling    No . Anxiety/Depression    No .  Fever/Chills/Fatigue    No . Chest Pain/Tightness/Palpitations     No . Skin Rash    No . Dental Problems    No . Joint/Muscle  Pain/Cramps    Yes . Blackout/Fainting    No . Other    No .  .  VITAL SIGNS  .  Pain scale 0, Ht-in 68.  Marland Kitchen  PHYSICAL EXAMINATION  .  Right hand is neurovascularly intact. He has got no DIP flexion  or extension of  the middle finger. Sensation intact radial, ulnar  and median nerve distributions. He has brisk capillary refill in  all 5 fingers and fires EDC, FDS, FDP, EPL, FPL, and interossei  to all the fingers. There is a pinpoint scab over the pin  insertion site on the middle finger. He is a little tender over  the tip of the middle finger.  .  ASSESSMENTS  .  Mallet finger of right hand - M20.011 (Primary)  .  TREATMENT  .  Others  Notes: Plan for the postoperative course was discussed. His  questions were answered. He was seen with Dr. Rodman Pickle. Pin will  stay in for 2-3 months and be taken out in the OR.  Postoperatively, he can expect the swelling over the middle  phalanx to reduce the time and he can continue working as  tolerated. He can follow up in 2 months.  .  FOLLOW UP  .  2 Months  .  Marland Kitchen  Appointment Provider: Jackalyn Lombard, MD  .  Electronically signed by Yetta Numbers , MD on  08/04/2018 at 09:33 AM EST  .  CONFIRMATORY SIGN OFF  .  Marland Kitchen  Document electronically  signed by Charyl Bigger MD  .

## 2018-07-03 ENCOUNTER — Ambulatory Visit: Admitting: Nephrology

## 2018-07-03 ENCOUNTER — Ambulatory Visit

## 2018-07-03 LAB — HX CHEM-PANELS
HX ANION GAP: 9 (ref 3–14)
HX BLOOD UREA NITROGEN: 29 mg/dL — ABNORMAL HIGH (ref 6–24)
HX CHLORIDE (CL): 104 meq/L (ref 98–110)
HX CO2: 24 meq/L (ref 20–30)
HX CREATININE (CR): 1.35 mg/dL — ABNORMAL HIGH (ref 0.57–1.30)
HX GFR, AFRICAN AMERICAN: 70 mL/min/{1.73_m2}
HX GFR, NON-AFRICAN AMERICAN: 60 mL/min/{1.73_m2}
HX GLUCOSE: 85 mg/dL (ref 70–139)
HX POTASSIUM (K): 4.3 meq/L (ref 3.6–5.1)
HX SODIUM (NA): 137 meq/L (ref 135–145)

## 2018-07-03 LAB — HX DIABETES
HX ALBUMIN TIMED CALC, URINE: 15.8 mg/{collection} (ref 0.0–29.9)
HX GLUCOSE: 85 mg/dL (ref 70–139)

## 2018-07-03 LAB — HX CHEM-METABOLIC: HX PTH, INTACT: 74 pg/mL (ref 11–95)

## 2018-07-04 LAB — HX BF-CHEM/URINE
HX ALBUMIN TIMED CALC, URINE: 15.8 mg/{collection} (ref 0.0–29.9)
HX CALCIUM TIMED CALC, URINE (MG/SPEC): 119 mg/{collection} (ref 100–300)
HX CALCIUM URINE TIMED CALC(G/SPEC): 0.12 g/colln (ref 0.10–0.30)
HX CREATININE TIMED CAL: 2.39 g/colln — ABNORMAL HIGH (ref 0.80–1.80)
HX CREATININE URINE TIMED CALC (MG/SPEC): 2390 mg/{collection} — ABNORMAL HIGH (ref 800–1800)
HX OSMOLALITY, URINE: 683 mosm/kg (ref 50–1300)
HX PHOSPHORUS TIMED CALC, URINE (G/SPEC): 1.24 g/colln — ABNORMAL HIGH (ref 0.34–1.00)
HX PHOSPHORUS TIMED CALC, URINE (MG/SPEC): 1238 mg/{collection} — ABNORMAL HIGH (ref 340–1000)
HX POTASSIUM TIMED CALC, URINE: 106 mEq/colln (ref 25–125)
HX SODIUM TIMED CALC, URINE: 169 mEq/colln (ref 40–220)
HX UR ALB/CREAT RATIO, TIMED (MG/G): 7 mg/g (ref 0–30)
HX UR ALB/CREAT RATIO, TIMED (MG/MG): 0.01 mg/mg (ref 0.00–0.03)
HX UREA NITROGEN TIMED CALC, URINE (G/SPEC): 25.47 g/colln — ABNORMAL HIGH (ref 6.00–17.00)
HX UREA NITROGEN TIMED CALC, URINE (MG/SPEC): 25470 mg/{collection} — ABNORMAL HIGH (ref 6000–17000)
HX URIC ACID TIMED CALC, URINE (G/SPEC): 0.4 g/colln (ref 0.3–0.8)
HX URIC ACID URINE TIMED CALC(MG/SPEC): 405 mg/{collection} (ref 250–750)
HX URINE TIME SPAN: 24 h
HX VOLUME, URINE, TIMED: 2250 mL

## 2018-07-05 ENCOUNTER — Ambulatory Visit

## 2018-07-05 ENCOUNTER — Ambulatory Visit: Admitting: Internal Medicine

## 2018-07-05 LAB — HX BF-CHEM/URINE
HX URINE VOL FOR U OXALATE 24 HR: 2250
HX URINE VOLUME 21: 2250
HX VOLUME, TIMED URINE: 2250
HX VOLUME, TIMED URINE: 2250

## 2018-07-05 LAB — HX CHEM-OTHER
HX ALBUMIN: 4.8 g/dL (ref 3.4–4.8)
HX CALCIUM (CA): 10.2 mg/dL (ref 8.5–10.5)
HX MAGNESIUM: 2.2 mg/dL (ref 1.6–2.6)
HX PHOSPHORUS: 3.4 mg/dL (ref 2.7–4.5)
HX URIC ACID: 8.2 mg/dL — ABNORMAL HIGH (ref 3.5–8.0)

## 2018-07-05 LAB — HX IMMUNOLOGY
HX LYME ANTIBODY INDEX: 0.06 {index} (ref 0.00–0.90)
HX LYME ANTIBODY INTERP: NEGATIVE

## 2018-07-05 NOTE — Progress Notes (Signed)
General Medicine Visit    Service Due by Standard Protocol Rules: COLONOSCOPY, PHQ2 SCORE, PATPORTALPIN.      Initial Screening   * [place]: [place] ([place])  Ht: 68 in.  Wt: 179.6 lbs.   BMI: 27.41  with shoes  Temp: 97.8 deg F.     BP (Initial [place] Screening): 125 / 85      BP (Rechecked, Actionable): 125 / 85 mmHg   HR: 87    O2 Sat: 97 %          Comments: .......................................Clydia Llano  July 05, 2018 10:59 AM          CHIEF COMPLAINT:  Follow-up multiple medical issues.    HISTORY:  The patient is complaining of pain in his left mid upper back.  He states he has the pain for a number of weeks after his son grabbed his left arm.  It went on for a while.  He did not really treat it in any specific way, but it went away.  It recurred about 3 days ago when again he was interacting with his son.  He notes the pain mostly when he lies down in bed at night on his left side.  If he rolls over his right side it resolves.  If he coughs, it tends to hurt also.    He reports a new upper respiratory infection, which started about 8-9 days ago.  He had cough and low-grade fever.  He took a Z-PAK on his own that he completed a number of days ago.  He is about 65-70% better, but still feels "crappy."  He is concerned that the posterior chest pain he has when lying on his left side could be pleuritic as it tends to worsen when he coughs.  This gets in the way of him sleeping.  His wife is also coughing and has had a chronic sore throat and some wheezing.    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  His blood uric acid level most recently was 8.2.  He is not currently on allopurinol.    He was seen by Dr. Daphane Shepherd in [facility]phrology Clinic for impaired renal function.  He will likely be going for a partial left nephrectomy with Dr. Dorene Sorrow in Urology at [facility] in January of next year.  The patient was to make sure his renal function is optimized before he has a partial left nephrectomy.    He had surgery for  a right third mallet finger on 06/15/18 with Dr. Rodman Pickle.  He has had follow-up and all is healing well.    PHYSICAL EXAMINATION:  Vital signs as above.  Lungs are clear bilaterally.  Heart is in a regular rhythm.  Abdomen is soft and nontender.  His back exam reveals some tenderness over the upper thoracic paraspinal musculature.  There are some tense muscles in his left upper back.  He has excellent range of motion of his left shoulder without pain.  He has no shoulder pain on palpation.    ASSESSMENT AND PLAN:  1.  Hypertension -- Blood pressure well controlled on current medication.  2.  Chronic renal impairment -- Most recent creatinine from 06/25/18 was 1.35 with a GFR of 60.  He is now seeing Dr. Daphane Shepherd in Nephrology Clinic to make sure that he optimizes his renal function prior to his kidney surgery in January.  3.  Complex left renal cyst -- This has some solid components to it and there is a possibility that  it is neoplastic.  He will wait until January to have surgery, so he will complete a year on Plavix for his stent.  The surgery will be done at [facility] by Dr. Dorene Sorrow and is scheduled for 09/25/18.  4.  Coronary artery disease -- Currently asymptomatic on Crestor, baby aspirin and Plavix.  He remains on Plavix for stent placed, which will be a year in January 2020.  5.  Left posterior shoulder pain -- This appears to be muscular.  It does not sound pleuritic.  Recommend moist heat.  Recommend Aleve 440 mg twice daily just for 2-3 days given his underlying renal dysfunction.  6.  Recent upper respiratory infection -- He completed a Z-PAK a few days ago.  He is about 65 70% better from this perspective.  7.  Check Lyme titer as he lives in an endemic area and continues to feel sluggish and achy.  8.  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  Recent uric acid level 8.2.  Consider adding allopurinol.  9.  Return to the office in 4-6 weeks for follow-up.      PATIENT NAME:  Brandon [name], Brandon [name]     MR  #: [id number]  DATE: [id number]  DICATED BY: Wynonia Lawman M.D. SIGNED BY:   NF:[id number] DT: [id number]   [id number]  I spent >50% of this 40 minute encounter in face to face counseling and coordination of care.  job [phone]                          Past Medical History (prior to today's visit):  HEMATURIA, GROSS (ICD-599.7) (ICD10-R31.0)  RENAL MASS (ICD-593.9) (ICD10-N28.89)  ARTHRALGIA (ICD-719.48) (ICD10-M25.50)  CORONARY ARTERY DISEASE (ICD-414.00) (ICD10-I25.10)  HYPERTENSION (ICD-401.9) (ICD10-I10)  URINARY FREQUENCY (ICD-788.41) (ICD10-R35.0)  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-N20.0)  RENAL INSUFFICIENCY (ICD-593.9) (ICD10-N28.9)  HYPERLIPIDEMIA (ICD-272.4) (ICD10-E78.5)  HEMATURIA, MICROSCOPIC (ICD-599.72) (ICD10-R31.2)  ACROCHORDON (ICD-701.9) (ICD10-L91.8)  OBESITY (ICD-278.01) (ICD10-E66.01)  SNORING (ICD-786.09) (ICD10-R06.83)  IRRITABLE BOWEL SYNDROME (ICD-564.1) (NUU72-Z36.6)    Past Medical History (changes today):  Added new problem of PLEURITIC CHEST PAIN (YQI-347.42) (ICD10-R07.89) - Signed  Added new problem of MALAISE (ICD-780.7) (ICD10-Z87.898) - Signed  Added new problem of UPPER BACK PAIN (ICD-724.5) (ICD10-M54.9) - Signed         Medications (prior to today's visit):  LISINOPRIL 20 MG ORAL TABLET (LISINOPRIL) Take one tablet by mouth once daily  CRESTOR 40 MG ORAL TABLET (ROSUVASTATIN CALCIUM) Take one tablet by mouth once daily.; Route: ORAL  PLAVIX 75 MG ORAL TABLET (CLOPIDOGREL BISULFATE) one by mouth daily; Route: ORAL  ASPIRIN 81 MG ORAL TABLET (ASPIRIN) Take one tablet by mouth once daily  TAMSULOSIN HCL 0.4 MG ORAL CAPSULE (TAMSULOSIN HCL) Take one capsule by mouth once daily; Route: ORAL  RA SAW PALMETTO 160 MG ORAL CAPSULE (SAW PALMETTO (SERENOA REPENS)) ; Route: ORAL  LORAZEPAM 0.5 MG ORAL TABLET (LORAZEPAM) one by mouth 30 min prior to MRI. May repeat x 1; Route: ORAL  TRIAMCINOLONE ACETONIDE 0.025 % EXTERNAL CREAM (TRIAMCINOLONE ACETONIDE) Apply to affected area bid;  Route: EXTERNAL    No Changes to Medication List                    Vitals:   Ht: 68 in.  Wt: 179.6 lbs.  BMI (in-lb) 27.41  Temp: 97.8deg F.     BP (Initial [place] Screening): 125 / 85     BP (Rechecked, Actionable): 125 / 85 mmHg  Pulse Rate: 87 bpm O2 Sat: 97 %                Orders (this visit):  DX Chest PA + Lateral - 2 views [CPT-99999]  LYME C6 Antibody Total [CPT-86618]  Other/Misc Orders [CPT-99999]  Est Level 4 [CPT-99214]    Patient Care Plan      Immunization Worksheet 2019           Created By Clydia Llano on 07/05/2018 at 10:55 AM    Electronically Signed By Wynonia Lawman, MD - PDC on 07/07/2018 at 12:45 PM

## 2018-07-05 NOTE — Progress Notes (Signed)
Orders     Phlebotomy/venipuncture (5101) - done in [facility] [CPT-36415]  done in pdc, .......................................Clydia Llano  July 05, 2018 12:39 PM        Created By Clydia Llano on 07/05/2018 at 12:39 PM    Electronically Signed By Kathreen Cosier on 07/05/2018 at 01:19 PM

## 2018-07-07 LAB — HX CHEM-PANELS
HX CYSTATIN C: 0.8 mg/L
HX EGFR (CYSTATIN): 108 (ref >=60)
HX EGFRCR-CYS, AFRICAN AMERICAN: 88 mL/min/{1.73_m2}
HX EGFRCR-CYS, NON-AFRICAN AMERICAN: 82 mL/min/{1.73_m2}

## 2018-07-08 LAB — HX IMMUNOLOGY
HX HGE AB IGG(A. PHAGOCYTOPHILUM): <1:64 {titer}
HX HGE AB IGM(A. PHAGOCYTOPHILUM): <1:20 {titer}

## 2018-07-09 LAB — HX MOLECULAR BIO
HX BAB RL: <1:20 {titer}
HX BAB RL: <1:20 {titer}
HX BABESIA MICROTI IGG: <1:64 {titer}
HX BABESIA MICROTI IGG: <1:64 {titer}

## 2018-07-14 ENCOUNTER — Ambulatory Visit

## 2018-07-14 ENCOUNTER — Ambulatory Visit: Admitting: Orthopaedic Surgery

## 2018-07-14 NOTE — Progress Notes (Signed)
* * *        **Brandon [name], Brandon [name]**    --- ---    40 Y old Male, DOB: April 23, 1966, External MRN: [id number]    Account Number: [id number]    [address] [name], [place]    Home: [phone]    Guarantor: [name], [name] Insurance: [facility] PPO    PCP: Brandon Gilles, MD Referring: Brandon Gilles, MD    [facility]ointment [facility]        * * *    07/14/2018  Progress Notes: Evelena Asa, MD **CHN#:** [phone]    --- ---    ---         **Reason for Appointment**    ---       1\. 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 This began when his son pulled on his arm  abruptly and first manifested as neck and shoulder pain and he actually  thought he had a rotator cuff tear at first. Dr. Rodman [name] examined his shoulder  and felt his symtpoms were not related to shoulder pathology. He has  difficulty sleeping due to stabbing periscapular pain. Pain is relieved  lateral bending of his neck away from the affected side. He has been attending  a few sessions of PT which has helped.    Of note, he had a similar episode in 1997 which resolved with rest,  particularly laying flat. He did not require surgery or injections.       **Current Medications**    ---    Taking     * Clopidogrel Bisulfate 75 mg Tablet 1 tablet Orally Once a day    ---    * Crestor 40 MG Tablet 1 tablet Orally Once a day    ---    * Lisinopril 40 mg Tablet 1 tablet Orally Once a day    ---    * Loperamide A-D 2 MG Tablet 1 tablet Orally after each loose bowel movement not to exceed 8 tablets a day    ---    Not-Taking/PRN    * Azithromycin 500 MG Tablet 1 tablet Orally once a day for up to 3 days if diarrhea (may repeat if needed)    ---    * Lisinopril 20 MG Tablet 1 tablet Orally Once a day    ---    * PredniSONE 20 MG Tablet 2 tablets Orally Once a day     ---    * Simvastatin Tablet 40 mg Take 1 tablet once a day by mouth once a day    ---    * Vivotif Berna Vaccine . Capsule Delayed Release 1 Orally every other day on an empty stomach; complete 1 week prior to departure; complete at least 1 week before any antibiotics    ---    * Medication List reviewed and reconciled with the patient    ---       **Past Medical History**    ---       Hypertension.        ---    Hypercholesterolemia.        ---    Renal stones.        ---  Renal insufficiency.        ---       **Surgical History**    ---       Closed manipulation and pinning, right middle mallet finger. 06/15/2018    ---       **Family History**    ---       Mother: alive, hypertension, diagnosed with Test    ---    Father: cabg; cad, Test    ---    Siblings: sister healthy    ---    Non-Contributory    ---       **Social History**    ---    Work/Occupation: employed full-time.    Alcohol Socially.    Illicit drugs: Denies.    Tobacco history: Never smoked.   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    ---       **Allergies**    ---       Penicillin: anaphylaxis - Allergy    ---       **Review of Systems**    ---    Per HPI, otherwise noncontributory.       **Vital Signs**    ---    Pain scale 8, Ht-in 68.       **Physical Examination**    ---    Patient is well-nourished, well-appearing and in no distress. Normal, non-  myelopathic, non-antalgic gait. No tenderness to palpation in the midline or  cervical paraspinal muscle groups. Pain with neck range of motion. Positive  spurlings to the left. 5/5 strength in the deltoid, biceps, triceps, wrist  extensor, finger flexor and interosseous muscle groups bilaterally. Sensation  is intact to light touch in the C5-T1 nerve distributions bilaterally. Hands  are warm and well perfused with no peripheral edema.       **Assessments**    ---    1\. Cervical radiculopathy - M54.12 (Primary)    ---       **Treatment**    ---       **1\. Cervical  radiculopathy**    Notes: All findings were reviewed Dr. Elesa [name] in the office today. No red flag  symptoms that warrant advanced imaging at this time. His periscapular pain is  consistent with cervical radiculopathy. We reviewed the natural history of  cervical radiculopathy and will start with a course a conservative treatment.  I will provide him with a medrol dose pack and flexeril, side effects were  discussed. I also recommend that he works from home for the next two weeks  while this calms down. He can continue PT. I will see him back on an as needed  basis.    ---        **2\. Others**    Start MethylPREDNISolone Tablet Therapy Pack, 4 MG, as directed, Orally, 1,  Refills 1    Start Flexeril Tablet, 10 MG, One tablet, Orally, PRN for muscle spasm, 30,  Refills 0       **Follow Up**    ---    prn    Electronically signed by Evelena Asa , MD on 07/14/2018 at 01:03 PM EST    Sign off status: Completed        * * *        [facility]    [address]    [place], [place] [address]    Tel: [phone]    Fax: [phone]              * * *  Patient: Brandon [name], Brandon [name] DOB: Jun 14, 1966 Progress Note: Evelena Asa, MD  07/14/2018    ---    Note generated by eClinicalWorks EMR/PM Software ([email or web address])

## 2018-07-14 NOTE — Progress Notes (Signed)
.  Progress Notes  .  Patient: Brandon [name], Brandon [name]  Provider: Evelena Asa    .  DOB: Mar 20, 1966 Age: 52 Y Sex: Male  .  PCP: [name] MD  Date: 07/14/2018  .  --------------------------------------------------------------------------------  .  REASON FOR APPOINTMENT  .  1. Left cervical radiculopathy  .  HISTORY OF PRESENT ILLNESS  .  GENERAL:  Brandon [name] is here today with a history of  2-3 months of periscapular pain and radiating arm pain to the  elbow. This began when his son pulled on his arm abruptly and  first manifested as neck and shoulder pain and he actually  thought he had a rotator cuff tear at first. Dr. Rodman Pickle examined  his shoulder and felt his symtpoms were not related to shoulder  pathology. He has difficulty sleeping due to stabbing  periscapular pain. Pain is relieved lateral bending of his neck  away from the affected side. He has been attending a few sessions  of PT which has helped. Of note, he had a similar episode in 1997  which resolved with rest, particularly laying flat. He did not  require surgery or injections.  .  CURRENT MEDICATIONS  .  Taking Clopidogrel Bisulfate 75 mg Tablet 1 tablet Orally Once a  day  Taking Crestor 40 MG Tablet 1 tablet Orally Once a day  Taking Lisinopril 40 mg Tablet 1 tablet Orally Once a day  Taking Loperamide A-D 2 MG Tablet 1 tablet Orally after each  loose bowel movement not to exceed 8 tablets a day  Not-Taking/PRN Azithromycin 500 MG Tablet 1 tablet Orally once a  day for up to 3 days if diarrhea (may repeat if needed)  Not-Taking/PRN Lisinopril 20 MG Tablet 1 tablet Orally Once a day  Not-Taking/PRN PredniSONE 20 MG Tablet 2 tablets Orally Once a  day  Not-Taking/PRN Simvastatin Tablet 40 mg Take 1 tablet once a day  by mouth once a day  Not-Taking/PRN Vivotif Berna Vaccine . Capsule Delayed Release 1  Orally every other day on an empty stomach; complete 1 week prior  to departure; complete at least 1 week before any antibiotics  Medication  List reviewed and reconciled with the patient  .  PAST MEDICAL HISTORY  .  Hypertension  Hypercholesterolemia  Renal stones  Renal insufficiency  .  ALLERGIES  .  Penicillin: anaphylaxis - Allergy  .  SURGICAL HISTORY  .  Closed manipulation and pinning, right middle mallet finger.  06/15/2018  .  FAMILY HISTORY  .  Mother: alive, hypertension, diagnosed with Test  Father: cabg; cad, Test  Siblings: sister healthy  Non-Contributory  .  SOCIAL HISTORY  .  Marland Kitchen  Work/Occupation: employed full-time.  .  Alcohol  Socially.  .  Illicit drugs: Denies.  .  Tobacco  history: Never smoked.  .  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  .  REVIEW OF SYSTEMS  .  Per HPI, otherwise noncontributory.  Marland Kitchen  VITAL SIGNS  .  Pain scale 8, Ht-in 68.  Marland Kitchen  PHYSICAL EXAMINATION  .  Patient is well-nourished, well-appearing and in no distress.  Normal, non-myelopathic, non-antalgic gait. No tenderness to  palpation in the midline or cervical paraspinal muscle groups.  Pain with neck range of motion. Positive spurlings to the left.  5/5 strength in the deltoid, biceps, triceps, wrist extensor,  finger flexor and interosseous muscle groups bilaterally.  Sensation is intact to light touch in the C5-T1 nerve  distributions  bilaterally. Hands are warm and well perfused with  no peripheral edema.  .  ASSESSMENTS  .  Cervical radiculopathy - M54.12 (Primary)  .  TREATMENT  .  Cervical radiculopathy  Notes: All findings were reviewed Brandon [name] in the office today.  No red flag symptoms that warrant advanced imaging at this time.  His periscapular pain is consistent with cervical radiculopathy.  We reviewed the natural history of cervical radiculopathy and  will start with a course a conservative treatment. I will provide  him with a medrol dose pack and flexeril, side effects were  discussed. I also recommend that he works from home for the next  two weeks while this calms down. He can continue PT. I will see  him back on an as needed  basis.  .  .  Others  Start MethylPREDNISolone Tablet Therapy Pack, 4 MG, as directed,  Orally, 1, Refills 1  Start Flexeril Tablet, 10 MG, One tablet, Orally, PRN for muscle  spasm, 30, Refills 0  .  FOLLOW UP  .  prn  .  Electronically signed by Evelena Asa , MD on  07/14/2018 at 01:03 PM EST  .  Document electronically signed by Evelena Asa    .

## 2018-07-17 LAB — HX BF-CHEM/URINE
HX CREATININE, URINE, TIMED FOR MG: 2.38 g/(24.h) — ABNORMAL HIGH
HX MAGNESIUM, URINE TIMED (MG/G CR): 99 mg/(24.h)
HX U OXALATE 24 HR: 42.8 mg/(24.h) — ABNORMAL HIGH

## 2018-07-17 LAB — HX TOXICOLOGY-SPECIAL: HX U CITRATE, 24 HR: 158 mg/(24.h)

## 2018-08-15 ENCOUNTER — Ambulatory Visit: Admitting: Nephrology

## 2018-08-15 ENCOUNTER — Ambulatory Visit

## 2018-08-15 LAB — HX TOXICOLOGY-SPECIAL

## 2018-08-15 NOTE — Progress Notes (Signed)
Division of General Medicine - Additional Orders      Orders     Other/Misc Orders [id number]        Created By Tomma Rakers MD on 08/15/2018 at 03:29 PM    Electronically Signed By Tomma Rakers MD on 08/15/2018 at 03:31 PM

## 2018-08-17 ENCOUNTER — Ambulatory Visit

## 2018-08-17 ENCOUNTER — Ambulatory Visit: Admitting: Nephrology

## 2018-08-17 LAB — HX BF-CHEM/URINE
HX ALBUMIN RANDOM URINE: 0.8 mg/dL
HX ALBUMIN/CREATININE RATIO, URINE: 13 mg/g (ref 0–30)
HX CREATININE, RANDOM URINE: 60.72 mg/dL
HX MICROALBUMIN CALC: 0.01 mg/mg
HX PROTEIN, RANDOM URINE(INCLUDES CREATININE): <7 mg/dL (ref 0–15)

## 2018-08-17 LAB — HX DIABETES: HX ALBUMIN RANDOM URINE: 0.8 mg/dL

## 2018-08-17 NOTE — Progress Notes (Signed)
* * *        **[name], [name]**    --- ---    46 Y old Male, DOB: 11/29/1965, External MRN: [id number]    Account Number: [id number]    [address] [name], [place]    Home: [phone]    Guarantor: Ambroise, [name] Insurance: [employer]    PCP: [name], MD Referring: [name], MD    Appointment Facility: Nephrology        * * *    08/17/2018   **Appointment Provider:** Harlene Salts, MD **CHN#:** [id number]    --- ---      **Supervising Provider:** [name], MD    ---         **Reason for Appointment**    ---       1\. NPV for kidney stones    ---       **History of Present Illness**    ---     _GENERAL_ :    Brandon [name] is a 34yoM with PMH nephrolithiasis, HTN, CAD s/p DES (09/2017),  recently found renal mass concerning for RCC, presenting to reestablish care.    Patient states that he has had 7 or 8 episodes of nephrolithiasis over the  last 10 years, but he is concerned that they are increasing in frequency. Has  had 4 episodes of nephrolithiasis this year. Usually presents as hematuria for  a day or 2 followed by severe flank pain. Almost always left-sided. Follows  with urology at [facility].    Prior procedures:    - ureteroscopy with lithotripsy (06/07/2018)    - cystoscopy with ureteral stent placement (12/2008)    24hr urine collection was done prior to this visit (07/03/2018) but urine  creatinine was higher than expected, possibly representing over-collection    During one of his urologic evaluation, was noted on imaging to have a complex  left renal cystic mass. Currently planned for surgery in January by Urologist  Dr. Spero [facility])    OTHER PMH    CAD s/p DES to LAD in 09/2017, remains on clopidogrel    States he has "cervical spine problems," confirmed on imaging.       **Current Medications**    ---    Taking     * Clopidogrel Bisulfate 75 mg Tablet 1 tablet Orally Once a day    ---    * Crestor 40 MG Tablet 1 tablet Orally Once a day    ---    * Flexeril  10 MG Tablet One tablet Orally PRN for muscle spasm, Notes: as needed    ---    * Lisinopril 40 mg Tablet 1 tablet Orally Once a day    ---    * Loperamide A-D 2 MG Tablet 1 tablet Orally after each loose bowel movement not to exceed 8 tablets a day    ---    Not-Taking/PRN    * Vivotif Berna Vaccine . Capsule Delayed Release 1 Orally every other day on an empty stomach; complete 1 week prior to departure; complete at least 1 week before any antibiotics    ---    * Medication List reviewed and reconciled with the patient    ---       **Past Medical History**    ---       Hypertension.        ---    Hypercholesterolemia.        ---  Renal stones.        ---    Renal insufficiency.        ---       **Surgical History**    ---       Closed manipulation and pinning, right middle mallet finger. 06/15/2018    ---    Ureteroscopy with Lithotripsy, Dr. Dolan Amen [facility], Urology)  06/07/2018    ---    Cystoscopy w/ureteral stent placement 12/27/2008    ---       **Family History**    ---       Mother: alive, hypertension, diagnosed with Test    ---    Father: cabg; cad, Test    ---    Siblings: sister healthy    ---       **Social History**    ---    Work/Occupation: employed full-time.    Alcohol Socially.    Illicit drugs: Denies.    Tobacco history: Never smoked.   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    ---       **Allergies**    ---       Penicillin: anaphylaxis - Allergy    ---       **Review of Systems**    ---     _Nephrology_ :    CONSTITUTIONAL: Denies unintentional weight change, fatigue, fever, chills. Marland Kitchen  HENT: Denies headaches, auditory changes, metallic taste, dysphagia. HEART:  Denies chest pain, palpitations, edema. GASTROINTESTINAL: Denies nausea,  vomiting, diarrhea, constipation, abdominal pain, blood in stools.  RESPIRATORY: Denies shortness of breath, cough. GENITOURINARY: Denies  difficulty or pain with urination, blood in urine, rashes, ulcers.  NEUROLOGICAL SYSTEM:  Denies dizziness, numbness, tingling, change in mental  status.          **Vital Signs**    ---    Ht-in 68, Wt-lbs 184.6, BMI 28.07, BP 134/78, HR 72, RR 14, BSA 2.00, Ht-cm  172.72, Wt-kg 83.73, Wt Change -2.8 lb.       **Physical Examination**    ---     _NEPHROLOGY_ :    CONSTITUTIONAL / VITALS: Appears well and in no apparent distress. Weight is  unchanged from previous visit.    HEENT: Eyes with no pallor or icterus. Oropharynx clear and oral mucous  membrane moist.    CVS: JVD not elevated. No carotid bruit. S1 and S2 normal, regular rate and  rhythm. No murmur, rub or gallop. No lower extremity or sacral edema.    LUNGS: Clear to auscultation bilaterally.    ABDOMEN: Not distended, soft and non-tender. No hepato-splenomegaly. Bowel  sounds present. No abdominal bruit.    EXTREMITIES: No joint swelling. No clubbing. Peripheral pulses palpable.    SKIN: No pallor, icterus, bruise or rash.          **Assessments**    ---    1\. Nephrolithiasis - N20.0 (Primary)    ---    2\. Elevated serum creatinine - R79.89    ---      Brandon [name] is a 27yoM with PMH nephrolithiasis, HTN, CAD s/p DES (09/2017),  recently found renal mass concerning for RCC, presenting to reestablish care.    ---       **Treatment**    ---       **1\. Nephrolithiasis**    Notes: Patient brought in a stone earlier this week, analysis pending. Urine  uric acid and urine sodium on a 24-hour collection were not notably elevated.  Urine urea nitrogen was elevated and may represent a risk factor for  nephrolithiasis    Of note, creatinine on the 24hr urine collection was higher than expected and  may represent over-collection. Patient has agreed to repeating the urine  collection.    ---        **2\. Elevated serum creatinine**    Notes: Recent 24-hour urine collection yielded CrCl 123 mL/min, UreaCl 61  mL/min. Bloodwork in October showed eGFR-Cr 60, eGFR-cys 108, eGFR-cr-cys 82.  However, as noted above, the 24hr urine collection had more creatinine  than  expected, so we will repeat this measurement. UACr 13    Patient had concern whether being on lisinopril would affect his upcoming  surgery (resection of complex renal cyst); we would not change his HTN regimen  for this surgery, would recommend keeping lisinopril as is.    **Appointment Provider:** Harlene Salts, MD    Electronically signed by [name] MD on 08/18/2018 at 09:52 PM EST    Sign off status: Completed        * * *        Nephrology    [address]    [address] 4th floor    [place], [place] [address]    Tel: [phone]    Fax: [phone]              * * *          Patient: Brandon [name], Brandon [name] DOB: 10/01/1965 Progress Note: Harlene Salts, MD  08/17/2018    ---    Note generated by eClinicalWorks EMR/PM Software ([email or web address])

## 2018-08-17 NOTE — Progress Notes (Signed)
.  Progress Notes  .  Patient: Brandon [name], Brandon [name]  Provider: Harlene Salts  MD  .  DOB: 02/09/1966 Age: 52 Y Sex: Male  Supervising Provider:: [name], MD  Date: 08/17/2018  .  PCP: [name] MD  Date: 08/17/2018  .  --------------------------------------------------------------------------------  .  REASON FOR APPOINTMENT  .  1. NPV for kidney stones  .  HISTORY OF PRESENT ILLNESS  .  GENERAL:  Brandon [name] is a 89yoM with PMH  nephrolithiasis, HTN, CAD s/p DES (09/2017), recently found renal  mass concerning for RCC, presenting to reestablish care.Patient  states that he has had 7 or 8 episodes of nephrolithiasis over  the last 10 years, but he is concerned that they are increasing  in frequency. Has had 4 episodes of nephrolithiasis this year.  Usually presents as hematuria for a day or 2 followed by severe  flank pain. Almost always left-sided. Follows with urology at  [facility].Prior procedures:- ureteroscopy with lithotripsy  (06/07/2018)- cystoscopy with ureteral stent placement  (09/6107)60AV urine collection was done prior to this visit  (07/03/2018) but urine creatinine was higher than expected,  possibly representing over-collectionDuring one of his urologic  evaluation, was noted on imaging to have a complex left renal  cystic mass. Currently planned for surgery in January by  Urologist Brandon [name] [facility])OTHER [facility] s/p DES to LAD  in 09/2017, remains on clopidogrelStates he has "cervical spine  problems," confirmed on imaging.  .  CURRENT MEDICATIONS  .  Taking Clopidogrel Bisulfate 75 mg Tablet 1 tablet Orally Once a  day  Taking Crestor 40 MG Tablet 1 tablet Orally Once a day  Taking Flexeril 10 MG Tablet One tablet Orally PRN for muscle  spasm, Notes: as needed  Taking Lisinopril 40 mg Tablet 1 tablet Orally Once a day  Taking Loperamide A-D 2 MG Tablet 1 tablet Orally after each  loose bowel movement not to exceed 8 tablets a day  Not-Taking/PRN Vivotif Berna Vaccine . Capsule Delayed  Release 1  Orally every other day on an empty stomach; complete 1 week prior  to departure; complete at least 1 week before any antibiotics  Medication List reviewed and reconciled with the patient  .  PAST MEDICAL HISTORY  .  Hypertension  Hypercholesterolemia  Renal stones  Renal insufficiency  .  ALLERGIES  .  Penicillin: anaphylaxis - Allergy  .  SURGICAL HISTORY  .  Closed manipulation and pinning, right middle mallet finger.  06/15/2018  Ureteroscopy with Lithotripsy, Dr. Dolan Amen [facility],  Urology) 06/07/2018  Cystoscopy w/ureteral stent placement 12/27/2008  .  FAMILY HISTORY  .  Mother: alive, hypertension, diagnosed with Test  Father: cabg; cad, Test  Siblings: sister healthy  .  SOCIAL HISTORY  .  Marland Kitchen  Work/Occupation: employed full-time.  .  .  Alcohol  Socially.  .  .  Illicit drugs: Denies.  .  .  Tobacco  history: Never smoked.  .  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  .  REVIEW OF SYSTEMS  .  Nephrology:  .  CONSTITUTIONAL:    Denies unintentional weight change, fatigue,  fever, chills.  Marland Kitchen HENT:    Denies headaches, auditory changes,  metallic taste, dysphagia . HEART:    Denies chest pain,  palpitations, edema . GASTROINTESTINAL:    Denies nausea,  vomiting, diarrhea, constipation, abdominal pain, blood in stools  . RESPIRATORY:    Denies shortness of breath, cough .  GENITOURINARY:  Denies difficulty or pain with urination, blood  in urine, rashes, ulcers . NEUROLOGICAL SYSTEM:    Denies  dizziness, numbness, tingling, change in mental status .  Marland Kitchen  VITAL SIGNS  .  Ht-in 68, Wt-lbs 184.6, BMI 28.07, BP 134/78, HR 72, RR 14, BSA  2.00, Ht-cm 172.72, Wt-kg 83.73, Wt Change -2.8 lb.  .  PHYSICAL EXAMINATION  .  NEPHROLOGY:  CONSTITUTIONAL / VITALS:  Appears well and in no apparent  distress. Weight is unchanged from previous visit.  HEENT:  Eyes with no pallor or icterus. Oropharynx clear and oral  mucous membrane moist.  CVS:  JVD not elevated. No carotid bruit. S1 and  S2 normal,  regular rate and rhythm. No murmur, rub or gallop. No lower  extremity or sacral edema.  LUNGS:  Clear to auscultation bilaterally.  ABDOMEN:  Not distended, soft and non-tender. No  hepato-splenomegaly. Bowel sounds present. No abdominal bruit.  EXTREMITIES:  No joint swelling. No clubbing. Peripheral pulses  palpable.  SKIN:  No pallor, icterus, bruise or rash.  .  ASSESSMENTS  .  Nephrolithiasis - N20.0 (Primary)  .  Elevated serum creatinine - R79.89  .  [name] is a 81yoM with PMH nephrolithiasis, HTN, CAD s/p DES  (09/2017), recently found renal mass concerning for RCC,  presenting to reestablish care.  .  TREATMENT  .  Nephrolithiasis  Notes: Patient brought in a stone earlier this week, analysis  pending. Urine uric acid and urine sodium on a 24-hour collection  were not notably elevated. Urine urea nitrogen was elevated and  may represent a risk factor for nephrolithiasisOf note,  creatinine on the 24hr urine collection was higher than expected  and may represent over-collection. Patient has agreed to  repeating the urine collection.  .  .  Elevated serum creatinine  Notes: Recent 24-hour urine collection yielded CrCl 123 mL/min,  UreaCl 61 mL/min. Bloodwork in October showed eGFR-Cr 60,  eGFR-cys 108, eGFR-cr-cys 82. However, as noted above, the 24hr  urine collection had more creatinine than expected, so we will  repeat this measurement. UACr 13Patient had concern whether being  on lisinopril would affect his upcoming surgery (resection of  complex renal cyst); we would not change his HTN regimen for this  surgery, would recommend keeping lisinopril as is.  .  .  Appointment Provider: Harlene Salts, MD  .  Electronically signed by [name] MD on  08/18/2018 at 09:52 PM EST  .  CONFIRMATORY SIGN OFF  I personally interviewed and examined the patient and both the fellow and I contributed to this electronic note. I agree with the history, exam, assessment and plan as detailed in this note and  edited it as necessary.   .  Document electronically signed by Harlene Salts  MD  .

## 2018-08-21 LAB — HX TOXICOLOGY-SPECIAL

## 2018-09-11 LAB — HEMOGLOBIN A1C: HEMOGLOBIN A1C % (INT/EXT): 5.3 % (ref 4.6–5.6)

## 2018-09-13 ENCOUNTER — Ambulatory Visit: Admitting: Sports Medicine

## 2018-09-13 ENCOUNTER — Ambulatory Visit

## 2018-09-14 ENCOUNTER — Ambulatory Visit

## 2018-09-14 ENCOUNTER — Ambulatory Visit: Admitting: Hand Surgery

## 2018-09-14 NOTE — Op Note (Signed)
 Patient    Brandon [name], Carew            Med Rec #:  [id number]  Name:  Operation  09/14/2018                Pt.  Dt:                                  Location:  .  Marland Kitchen                               OPERATIVE REPORT  .  Marland Kitchen  PREOPERATIVE DIAGNOSIS:  Retained orthopedic hardware, right middle  finger.  Marland Kitchen  POSTOPERATIVE DIAGNOSIS:  Retained orthopedic hardware, right middle  finger.  Marland Kitchen  PROCEDURE:  Removal of buried pin, right middle finger.  .  SURGEON:  Yetta Numbers, M.D.  .  ASSISTANT:  Milinda Cave, M.D.  .  ANESTHESIA:  Local.  .  COMPLICATIONS:  None.  .  ESTIMATED BLOOD LOSS:  Minimal.  .  TOURNIQUET TIME:  Less than an hour.  .  INDICATIONS:  This is a planned return to the operating room during the  global period following pinning of a mallet finger of the right middle  finger.  He would like to have the pin removed.  Risks of surgery  including, but not limited to infection, nerve damage, stiffness, and  recurrent droop were explained to the patient preoperatively, who wished to  proceed.  .  DESCRIPTION OF PROCEDURE:  In the holding area, a webspace block was  performed under sterile conditions using 2% bupivacaine.  In the operating  room, the patient was placed in supine position on the operating table.  The right upper extremity was prepped and draped in a sterile fashion.  A  Penrose drain was used as a Energy manager tourniquet.  A longitudinal incision  was made over the tip of the right middle finger at the site of the buried  pin.  The pin was identified and the soft tissue around it was carefully  mobilized and the pin was retrieved without difficulty.  A 5-0 nylon suture  was placed followed by Dermabond and a sterile dressing.  The patient  tolerated the procedure well and was discharged to the recovery room in  good condition.  .  .  .  Electronically Signed  Yetta Numbers, MD 09/18/2018 08:23 A  .  .  Marland Kitchen  Dictated byYetta Numbers, MD  .  D:    09/16/2018  T:    09/16/2018 05:31 P  Dictation ID:   [id number]/Doc#  [id number]  .  cc:  .  Marland Kitchen      Document is preliminary until electronically or manually signed by                             attending physician.

## 2018-09-15 LAB — HX COLONOSCOPY

## 2018-09-15 LAB — HX SURGICAL

## 2018-09-20 ENCOUNTER — Ambulatory Visit

## 2018-09-20 NOTE — Progress Notes (Signed)
 [facility] September 20, 2018  [address]   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  Cath at that time revealed no other significant coronary lesions. He has a LVEF of 55% based on echocardiogram from April 2019.  After discussion with his cardiologist, Dr. Frances Maywood,  he is deemed a low risk to undergo proposed partial nephrectomy for a renal tumor.  No additional pre-op cardiac work-up is indicated.        Sincerely,      [name], MD  [facility]  [phone]      Created By Wynonia Lawman MD on 09/20/2018 at 03:18 PM    Electronically Signed By Wynonia Lawman MD on 09/29/2018 at 01:30 PM

## 2018-09-27 LAB — BMP (EXT)
Anion Gap (EXT): 8 mmol/L (ref 2–15)
BUN (EXT): 25 mg/dL — ABNORMAL HIGH (ref 7–24)
CO2 (EXT): 23 mmol/L — ABNORMAL LOW (ref 24–32)
CalciumCalcium (EXT): 8.6 mg/dL (ref 8.5–10.5)
Chloride (EXT): 111 mmol/L — ABNORMAL HIGH (ref 98–110)
Creatinine (EXT): 1.5 mg/dL — ABNORMAL HIGH (ref 0.6–1.3)
Glucose (EXT): 145 mg/dL — ABNORMAL HIGH (ref 70–118)
Potassium (EXT): 4.8 mmol/L (ref 3.4–5.2)
Sodium (EXT): 142 mmol/L (ref 135–146)
eGFR - Creat MDRD (EXT): 49 mL/min/BSA — ABNORMAL LOW (ref >=60)
eGFR - Creat MDRD (EXT): 60 mL/min/BSA (ref >=60)

## 2018-09-28 LAB — BMP (EXT)
Anion Gap (EXT): 9 mmol/L (ref 2–15)
BUN (EXT): 23 mg/dL (ref 7–24)
CO2 (EXT): 21 mmol/L — ABNORMAL LOW (ref 24–32)
CalciumCalcium (EXT): 8 mg/dL — ABNORMAL LOW (ref 8.5–10.5)
Chloride (EXT): 112 mmol/L — ABNORMAL HIGH (ref 98–110)
Creatinine (EXT): 1.7 mg/dL — ABNORMAL HIGH (ref 0.6–1.3)
Glucose (EXT): 140 mg/dL — ABNORMAL HIGH (ref 70–118)
Potassium (EXT): 4.1 mmol/L (ref 3.4–5.2)
Sodium (EXT): 142 mmol/L (ref 135–146)
eGFR - Creat MDRD (EXT): 43 mL/min/BSA — ABNORMAL LOW (ref >=60)
eGFR - Creat MDRD (EXT): 52 mL/min/BSA — ABNORMAL LOW (ref >=60)

## 2018-09-29 LAB — BMP (EXT)
Anion Gap (EXT): 6 mmol/L (ref 2–15)
BUN (EXT): 22 mg/dL (ref 7–24)
CO2 (EXT): 23 mmol/L — ABNORMAL LOW (ref 24–32)
CalciumCalcium (EXT): 8.3 mg/dL — ABNORMAL LOW (ref 8.5–10.5)
Chloride (EXT): 110 mmol/L (ref 98–110)
Creatinine (EXT): 1.6 mg/dL — ABNORMAL HIGH (ref 0.6–1.3)
Glucose (EXT): 121 mg/dL — ABNORMAL HIGH (ref 70–118)
Potassium (EXT): 3.7 mmol/L (ref 3.4–5.2)
Sodium (EXT): 139 mmol/L (ref 135–146)
eGFR - Creat MDRD (EXT): 46 mL/min/BSA — ABNORMAL LOW (ref >=60)
eGFR - Creat MDRD (EXT): 56 mL/min/BSA — ABNORMAL LOW (ref >=60)

## 2018-09-30 LAB — BMP (EXT)
Anion Gap (EXT): 5 mmol/L (ref 2–15)
BUN (EXT): 19 mg/dL (ref 7–24)
CO2 (EXT): 23 mmol/L — ABNORMAL LOW (ref 24–32)
CalciumCalcium (EXT): 8.2 mg/dL — ABNORMAL LOW (ref 8.5–10.5)
Chloride (EXT): 108 mmol/L (ref 98–110)
Creatinine (EXT): 1.6 mg/dL — ABNORMAL HIGH (ref 0.6–1.3)
Glucose (EXT): 120 mg/dL — ABNORMAL HIGH (ref 70–118)
Potassium (EXT): 3.6 mmol/L (ref 3.4–5.2)
Sodium (EXT): 136 mmol/L (ref 135–146)
eGFR - Creat MDRD (EXT): 46 mL/min/BSA — ABNORMAL LOW (ref >=60)
eGFR - Creat MDRD (EXT): 56 mL/min/BSA — ABNORMAL LOW (ref >=60)

## 2018-10-01 LAB — BMP (EXT)
Anion Gap (EXT): 11 mmol/L (ref 2–15)
Anion Gap (EXT): 10 mmol/L (ref 2–15)
BUN (EXT): 19 mg/dL (ref 7–24)
BUN (EXT): 21 mg/dL (ref 7–24)
CO2 (EXT): 20 mmol/L — ABNORMAL LOW (ref 24–32)
CO2 (EXT): 22 mmol/L — ABNORMAL LOW (ref 24–32)
CalciumCalcium (EXT): 8.7 mg/dL (ref 8.5–10.5)
CalciumCalcium (EXT): 8.9 mg/dL (ref 8.5–10.5)
Chloride (EXT): 104 mmol/L (ref 98–110)
Chloride (EXT): 105 mmol/L (ref 98–110)
Creatinine (EXT): 1.5 mg/dL — ABNORMAL HIGH (ref 0.6–1.3)
Creatinine (EXT): 1.5 mg/dL — ABNORMAL HIGH (ref 0.6–1.3)
Glucose (EXT): 103 mg/dL (ref 70–118)
Glucose (EXT): 102 mg/dL (ref 70–118)
Potassium (EXT): 3.5 mmol/L (ref 3.4–5.2)
Potassium (EXT): 3.7 mmol/L (ref 3.4–5.2)
Sodium (EXT): 135 mmol/L (ref 135–146)
Sodium (EXT): 137 mmol/L (ref 135–146)
eGFR - Creat MDRD (EXT): 49 mL/min/BSA — ABNORMAL LOW (ref >=60)
eGFR - Creat MDRD (EXT): 60 mL/min/BSA (ref >=60)
eGFR - Creat MDRD (EXT): 49 mL/min/BSA — ABNORMAL LOW (ref >=60)
eGFR - Creat MDRD (EXT): 60 mL/min/BSA (ref >=60)

## 2018-10-02 ENCOUNTER — Ambulatory Visit

## 2018-10-02 LAB — BMP (EXT)
Anion Gap (EXT): 10 mmol/L (ref 2–15)
BUN (EXT): 20 mg/dL (ref 7–24)
CO2 (EXT): 25 mmol/L (ref 24–32)
CalciumCalcium (EXT): 9 mg/dL (ref 8.5–10.5)
Chloride (EXT): 102 mmol/L (ref 98–110)
Creatinine (EXT): 1.6 mg/dL — ABNORMAL HIGH (ref 0.6–1.3)
Glucose (EXT): 110 mg/dL (ref 70–118)
Potassium (EXT): 3.7 mmol/L (ref 3.4–5.2)
Sodium (EXT): 137 mmol/L (ref 135–146)
eGFR - Creat MDRD (EXT): 46 mL/min/BSA — ABNORMAL LOW (ref >=60)
eGFR - Creat MDRD (EXT): 56 mL/min/BSA — ABNORMAL LOW (ref >=60)

## 2018-10-10 ENCOUNTER — Ambulatory Visit: Admitting: Nephrology

## 2018-10-10 ENCOUNTER — Ambulatory Visit

## 2018-10-10 LAB — HX HEM-ROUTINE
HX HCT: 39.4 % (ref 37.0–47.0)
HX HGB: 12.6 g/dL — ABNORMAL LOW (ref 13.5–16.0)
HX MCH: 29.2 pg (ref 26.0–34.0)
HX MCHC: 32 g/dL (ref 32.0–36.0)
HX MCV: 91.2 fL (ref 80.0–98.0)
HX MPV: 9.9 fL (ref 9.1–11.7)
HX NRBC #: 0 10*3/uL
HX NUCLEATED RBC: 0 %
HX PLT: 505 10*3/uL — ABNORMAL HIGH (ref 150–400)
HX RBC BLOOD COUNT: 4.32 M/uL (ref 4.20–5.50)
HX RDW: 12.6 % (ref 11.5–14.5)
HX WBC: 13.5 10*3/uL — ABNORMAL HIGH (ref 4.0–11.0)

## 2018-10-10 LAB — HX CHEM-PANELS
HX ANION GAP: 12 (ref 3–14)
HX BLOOD UREA NITROGEN: 34 mg/dL — ABNORMAL HIGH (ref 6–24)
HX CHLORIDE (CL): 106 meq/L (ref 98–110)
HX CO2: 22 meq/L (ref 20–30)
HX CREATININE (CR): 1.49 mg/dL — ABNORMAL HIGH (ref 0.57–1.30)
HX GFR, AFRICAN AMERICAN: 62 mL/min/{1.73_m2}
HX GFR, NON-AFRICAN AMERICAN: 53 mL/min/{1.73_m2} — ABNORMAL LOW
HX GLUCOSE: 83 mg/dL (ref 70–139)
HX POTASSIUM (K): 4.6 meq/L (ref 3.6–5.1)
HX SODIUM (NA): 140 meq/L (ref 135–145)

## 2018-10-10 LAB — HX CHEM-LFT
HX ALANINE AMINOTRANSFERASE (ALT/SGPT): 113 IU/L — ABNORMAL HIGH (ref 0–54)
HX ALKALINE PHOSPHATASE (ALK): 76 IU/L (ref 40–130)
HX ASPARTATE AMINOTRANFERASE (AST/SGOT): 43 IU/L — ABNORMAL HIGH (ref 10–42)
HX BILIRUBIN, DIRECT: 0.3 mg/dL (ref 0.0–0.5)
HX BILIRUBIN, TOTAL: 0.5 mg/dL (ref 0.2–1.1)
HX LACTATE DEHYDROGENASE (LDH): 265 IU/L — ABNORMAL HIGH (ref 120–220)

## 2018-10-10 LAB — HX CHEM-OTHER
HX ALBUMIN: 4.4 g/dL (ref 3.4–4.8)
HX CALCIUM (CA): 10.3 mg/dL (ref 8.5–10.5)
HX MAGNESIUM: 1.9 mg/dL (ref 1.6–2.6)
HX PHOSPHORUS: 3.3 mg/dL (ref 2.7–4.5)
HX URIC ACID: 8.7 mg/dL — ABNORMAL HIGH (ref 3.5–8.0)

## 2018-10-10 LAB — HX DIABETES: HX GLUCOSE: 83 mg/dL (ref 70–139)

## 2018-10-10 NOTE — Progress Notes (Signed)
 .  Progress Notes  .  Patient: Brandon, [name]  Provider: Mar Daring  DOB: 1966-07-28 Age: 53 Y Sex: Male  .  PCP: [name] MD  Date: 10/10/2018  .  --------------------------------------------------------------------------------  .  REASON FOR APPOINTMENT  .  1. FU/QL/MEYER/AOC  .  HISTORY OF PRESENT ILLNESS  .  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  He was hospitalized for 6 days. All antihypertensive treatment  was initially stopped. When his blood pressure rose to 195/100 in  the context of some discomfort, although controlled with  patient-controlled analgesia, and a salty clear liquid diet,  amlodipine was begun and increased to 10 mg. He resumed  lisinopril 40 mg when he returned home. His home blood pressures  have recently been in the 120s systolic. He is off opioids,  muscle relaxants and gabapentin. He notes that he is very  emotional, as he is when withdrawing from opioids. He says that  the first time he received opioids for kidney stone pain, he had  all of the typical withdrawal symptoms of shakiness and  gastrointestinal symptoms. Following subsequent courses of  opioids, he finds that he is just emotionally very labile for 10  to 14 days. He was constipated for a week postoperatively, but  now is moving his bowels regularly. He has had a good appetite  throughout. The anesthesia lasted 6hours, and he says that for 10  days, he was in a fog which is just now clearing. I asked him  whether, if he had to, he would be able to read a radiologic  study now, and he said that he thought that his ability to read a  musculoskeletal study, his field of interest, was about 30% right  now. He described the sensation as being like a hangover. He has  never slept particularly well. He reminded me that in the past,  we had discussed a sleep apnea evaluation, but he was not able to  tolerate the home  device. I encouraged him to pursue this through  me or Dr. [name]: I think he should have an evaluation by a  sleep specialist and a formal sleep study. He specifically cannot  sleep on his side because of the discomfort postoperatively, and  finds sleeping on his back very difficult. When I asked him about  sexual dysfunction, he said that amlodipine was not good for  sexual function. He has not had problems with lisinopril in the  past.  .  CURRENT MEDICATIONS  .  Taking Amlodipine Besylate 10 MG Tablet 1 tablet Orally Once a  day  Taking Aspirin 81 81 MG Tablet Delayed Release as directed Orally  Taking Crestor 40 MG Tablet 1 tablet Orally Once a day  Taking Lisinopril 40 mg Tablet 1 tablet Orally Once a day  .  PAST MEDICAL HISTORY  .  Hypertension  Hypercholesterolemia  Recurrent uric acid kidney stones  .  ALLERGIES  .  Penicillin: anaphylaxis - Allergy  .  SURGICAL HISTORY  .  Closed manipulation and pinning, right middle mallet finger.  06/15/2018  Ureteroscopy with Lithotripsy, Dr. Dolan Amen [facility],  Urology) 06/07/2018  Cystoscopy w/ureteral stent placement 12/27/2008  Partial left nephrectomy for papillary renal cell carcinoma, 4 cm  [facility], Urology) 09/27/2018  .  SOCIAL HISTORY  .  Marland Kitchen  Work/Occupation: employed full-time.  Marland Kitchen  Alcohol  Socially.  .  Illicit drugs: Denies.  .  Tobacco  history: Never smoked.  .  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  .  VITAL SIGNS  .  Pain scale 2, Ht-in 68, BP 124/77, HR 99, Ht-cm 172.72.  Marland Kitchen  PHYSICAL EXAMINATION  .  Blood pressures by SPRINT protocol: average 124/77, HR 99;  individual readingns 117/76, HR 98; 123/75, HR 102; 133/81, HR  96. His home monitor 129/79 mm Hg.He gives a clear and precise  history. Chest clear. Legs without edema. Mood is cheerful.  Laboratory DataHb 12.6 g/dL, WBC 12.1 K/microL, Platelets 505  K/microL, K 4.6 mEq/L, creatinine 1.43 mg/dL, eGFR(creatinine) 53  FX/JOI/3.25 m2, AST 43, ALT 13, LDH 265,  alkaline phosphatase 76  units/L, total bilirubin 0.5, uric acid 8.7 mg/dL.  .  ASSESSMENTS  .  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  Coronary artery disease involving native coronary artery of  native heart without angina pectoris - I25.10  .  Hypertension, unspecified type - I10  .  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  Hypercholesterolemia with hypertriglyceridemia - E78.2  .  Overweight - E66.3  .  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  Elevated alanine aminotransferase (ALT) level - R74.0  .  Chronic kidney disease, stage 2 (mild) - N18.2  .  TREATMENT  .  Papillary renal cell carcinoma  Notes: He reports that he will have CT followup first every 6  months and then annually for several years.  .  .  Coronary artery disease involving native coronary  artery of native heart without angina pectoris  Notes: Now off clopidogrel. On aspirin and rosuvastatin. No  symptoms.  .  .  Hypertension, unspecified type  Stop Amlodipine Besylate Tablet, 10 MG, 1 tablet, Orally, Once a  day  Start Amlodipine Besylate Tablet, 5 MG, 1 tablet, Orally, Once a  day, 30 day(s), 30, Refills 0  Notes:  .  Blood pressure is optimal, but amlodipine side-effect is not  acceptable. I asked him to reduce amlodipine to 5 mg for three  days and stop it if blood pressure is still below 140 systolic. I  sent Rx to [facility]. I think that goal blood pressure for him  should be in 120s, and I anticipate that we will need a second  agent. I anticipate that we will begin doxazosin 1 mg daily when  we need it. I think he should be evaluated for sleep apnea.  .  .  .  .  Uric acid urolithiasis  Notes: I anticipate that we'll begin allopurinol to reduce uric  acid production when blood pressure regime has stabilized.  .  .  Overweight  Notes:  .  He asked whether exercise would help with blood pressure control,  and I told him that his time recuperating from surgery would be a  good occasion to make a  new start at an exercise routine,  starting gently.  .  .  .  Marland Kitchen  Hyperuricemia without signs inflammatory  arthritis/tophaceous disease  Notes: Uric acid 8.7. No history of gout, but will treat when  antihypertensive regimen determined to reduce frequency of uric  acid stones.  .  .  Elevated alanine aminotransferase (ALT) level  Notes: I wonder whether this is related to anaesthesia. Will  repeat in a few weeks, with CK and AST (the last more mildly  elevated) make sure not related  to rosuvastatin.  .  .  Chronic kidney disease, stage 2 (mild)  Notes:  .  Before partial left nephrectomy, eGFR(creatinine and cystatin)  was 70. He had a history of recurrent uric acid nephrolithiasis  and of hypertension. It wasn't clear, in the absence of  albuminuria, that this GFR merited the diagnosis of chronic  kidney disease. He had partial left nephrectomy for renal cell  carcinoma September 27, 2018. Now, eGFR(creatinine) is 53. I  suspect that when the cystatin C measurement done today is back,  eGFR(creatinine and cystatin) will be in 60s or 70s, but I think  that taken together, with the history of partial nephrectomy, and  severe hypertension, this merits the diagnosis of chronic kidney  disease.  .  .  .  .  Others  Notes: This was a 60 minute visit. All but 15 minutes was  occupied by counseling.  .  Electronically signed by [name] on  10/10/2018 at 08:58 PM EST  .  Document electronically signed by Mar Daring

## 2018-10-10 NOTE — Progress Notes (Signed)
 * * *      **Brandon [name]**    ------    52 Y old Male, DOB: Apr 19, 1966, External MRN: [id number]    Account Number: [id number]    [address] [name], [place]    Home: [phone]    Guarantor: [name], [name] Insurance: [employer]    PCP: [name], MD Referring: [name], MD    Appointment Facility: Nephrology        * * *    10/10/2018 Progress Notes: [name], MD **CHN#:** [phone]    ------    ---       **Reason for Appointment**    ---      1\. FU/QL/[name]/AOC    ---      **History of Present Illness**    ---     _GENERAL_ :    He returns for follow-up of hypertension 13 days following partial left  nephrectomy for a 4 cm papillary renal cell cancer. He was hospitalized for 6  days. All antihypertensive treatment was initially stopped. When his blood  pressure rose to 195/100 in the context of some discomfort, although  controlled with patient-controlled analgesia, and a salty clear liquid diet,  amlodipine was begun and increased to 10 mg. He resumed lisinopril 40 mg when  he returned home. His home blood pressures have recently been in the 120s  systolic. He is off opioids, muscle relaxants and gabapentin. He notes that he  is very emotional, as he is when withdrawing from opioids. He says that the  first time he received opioids for kidney stone pain, he had all of the  typical withdrawal symptoms of shakiness and gastrointestinal symptoms.  Following subsequent courses of opioids, he finds that he is just emotionally  very labile for 10 to 14 days. He was constipated for a week postoperatively,  but now is moving his bowels regularly. He has had a good appetite throughout.  The anesthesia lasted 6hours, and he says that for 10 days, he was in a fog  which is just now clearing. I asked him whether, if he had to, he would be  able to read a radiologic study now, and he said that he thought that his  ability to read a musculoskeletal study, his field of  interest, was about 30%  right now. He described the sensation as being like a hangover. He has never  slept particularly well. He reminded me that in the past, we had discussed a  sleep apnea evaluation, but he was not able to tolerate the home device. I  encouraged him to pursue this through me or Dr. [name]: I think he should  have an evaluation by a sleep specialist and a formal sleep study. He  specifically cannot sleep on his side because of the discomfort  postoperatively, and finds sleeping on his back very difficult. When I asked  him about sexual dysfunction, he said that amlodipine was not good for sexual  function. He has not had problems with lisinopril in the past.      **Current Medications**    ---    Taking    * Amlodipine Besylate 10 MG Tablet 1 tablet Orally Once a day    ---    * Aspirin 81 81 MG Tablet Delayed Release as directed Orally     ---    * Crestor 40 MG Tablet 1 tablet Orally Once a day    ---    *  Lisinopril 40 mg Tablet 1 tablet Orally Once a day    ---      **Past Medical History**    ---      Hypertension.        ---    Hypercholesterolemia.        ---    Recurrent uric acid kidney stones.        ---      **Surgical History**    ---      Closed manipulation and pinning, right middle mallet finger. 06/15/2018    ---    Ureteroscopy with Lithotripsy, Dr. Dolan Amen [facility], Urology)  06/07/2018    ---    Cystoscopy w/ureteral stent placement 12/27/2008    ---    Partial left nephrectomy for papillary renal cell carcinoma, 4 cm [facility], Urology) 09/27/2018    ---      **Social History**    ---    Work/Occupation: employed full-time.    Alcohol Socially.    Illicit drugs: Denies.    Tobacco history: Never smoked.  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    ---      **Allergies**    ---      Penicillin: anaphylaxis - Allergy    ---      **Vital Signs**    ---    Pain scale 2, Ht-in 68, BP 124/77, HR 99, Ht-cm 172.72.      **Physical  Examination**    ---    Blood pressures by SPRINT protocol: average 124/77, HR 99; individual  readingns 117/76, HR 98; 123/75, HR 102; 133/81, HR 96. His home monitor  129/79 mm Hg.    He gives a clear and precise history. Chest clear. Legs without edema. Mood is  cheerful.    Laboratory Data    Hb 12.6 g/dL, WBC 91.4 K/microL, Platelets 505 K/microL, K 4.6 mEq/L,  creatinine 1.43 mg/dL, eGFR(creatinine) 53 NW/GNF/6.21 m2, AST 43, ALT 13, LDH  265, alkaline phosphatase 76 units/L, total bilirubin 0.5, uric acid 8.7  mg/dL.      **Assessments**    ---    1\. 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 Coronary artery disease involving native coronary artery of native heart  without angina pectoris - I25.10    ---    3\. Hypertension, unspecified type - I10    ---    4\. 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 Hypercholesterolemia with hypertriglyceridemia - E78.2    ---    6\. Overweight - E66.3    ---    7\. 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 Elevated alanine aminotransferase (ALT) level - R74.0    ---    9\. Chronic kidney disease, stage 2 (mild) - N18.2    ---      **Treatment**    ---      **1\. Papillary renal cell carcinoma**    Notes: He reports that he will have CT followup first every 6 months and then  annually for several years.    ---        **2\. Coronary artery disease involving native coronary artery of native heart  without angina pectoris**    Notes: Now off clopidogrel. On aspirin and rosuvastatin. No symptoms.        **3\. Hypertension, unspecified type**    Stop  Amlodipine Besylate Tablet, 10 MG, 1 tablet, Orally, Once a day    Start Amlodipine Besylate Tablet, 5 MG, 1 tablet, Orally, Once a day, 30  day(s), 30, Refills 0    Notes:    Blood pressure is optimal, but amlodipine side-effect is not acceptable. I  asked him to reduce amlodipine to 5 mg for three days and stop it if blood  pressure is still below 140 systolic. I  sent Rx to [facility]. I think that  goal blood pressure for him should be in 120s, and I anticipate that we will  need a second agent. I anticipate that we will begin doxazosin 1 mg daily when  we need it.        I think he should be evaluated for sleep apnea.      .        **4\. Uric acid urolithiasis**    Notes: I anticipate that we'll begin allopurinol to reduce uric acid  production when blood pressure regime has stabilized.        **5\. Overweight**    Notes:    He asked whether exercise would help with blood pressure control, and I told  him that his time recuperating from surgery would be a good occasion to make a  new start at an exercise routine, starting gently.    .        **6\. 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 No history of gout, but will treat when antihypertensive  regimen determined to reduce frequency of uric acid stones.        **7\. Elevated alanine aminotransferase (ALT) level**    Notes: I wonder whether this is related to anaesthesia. Will repeat in a few  weeks, with CK and AST (the last more mildly elevated) make sure not related  to rosuvastatin.        **8\. Chronic kidney disease, stage 2 (mild)**    Notes:    Before partial left nephrectomy, eGFR(creatinine and cystatin) was 70. He had  a history of recurrent uric acid nephrolithiasis and of hypertension. It  wasn't clear, in the absence of albuminuria, that this GFR merited the  diagnosis of chronic kidney disease. He had partial left nephrectomy for renal  cell carcinoma September 27, 2018. Now, eGFR(creatinine) is 53. I suspect that  when the cystatin C measurement done today is back, eGFR(creatinine and  cystatin) will be in 60s or 70s, but I think that taken together, with the  history of partial nephrectomy, and severe hypertension, this merits the  diagnosis of chronic kidney disease.    .        **9\. Others**    Notes: This was a 60 minute visit. All but 15 minutes  was occupied by  counseling.    Electronically signed by [name] on 10/10/2018 at 08:58 PM EST    Sign off status: Completed        * * *        Nephrology    [address]    [address] 4th floor    [place], [place] [address]    Tel: [phone]    Fax: [phone]              * * *         Patient: Brandon [name], Brandon [name] DOB: 20-Jan-1966 Progress Note: [name], MD  10/10/2018    ---    Note generated by eClinicalWorks EMR/PM Software (  www.[email or web address])

## 2018-10-13 LAB — HX CHEM-PANELS
HX CYSTATIN C: 1.02 mg/L
HX EGFR (CYSTATIN): 79 (ref >=60)
HX EGFRCR-CYS, AFRICAN AMERICAN: 70 mL/min/{1.73_m2}
HX EGFRCR-CYS, NON-AFRICAN AMERICAN: 65 mL/min/{1.73_m2}

## 2018-10-18 ENCOUNTER — Ambulatory Visit

## 2018-10-18 ENCOUNTER — Ambulatory Visit: Admitting: Internal Medicine

## 2018-10-18 LAB — HX CHEM-LFT
HX ALANINE AMINOTRANSFERASE (ALT/SGPT): 91 IU/L — ABNORMAL HIGH (ref 0–54)
HX ALKALINE PHOSPHATASE (ALK): 70 IU/L (ref 40–130)
HX ASPARTATE AMINOTRANFERASE (AST/SGOT): 21 IU/L (ref 10–42)
HX BILIRUBIN, TOTAL: 0.7 mg/dL (ref 0.2–1.1)

## 2018-10-18 LAB — HX HEM-ROUTINE
HX BASO #: 0 10*3/uL (ref 0.0–0.2)
HX BASO: 1 %
HX EOSIN #: 0.2 10*3/uL (ref 0.0–0.5)
HX EOSIN: 3 %
HX HCT: 39 % (ref 37.0–47.0)
HX HGB: 12.6 g/dL — ABNORMAL LOW (ref 13.5–16.0)
HX IMMATURE GRANULOCYTE#: 0 10*3/uL (ref 0.0–0.1)
HX IMMATURE GRANULOCYTE: 0 %
HX LYMPH #: 2.1 10*3/uL (ref 1.0–4.0)
HX LYMPH: 27 %
HX MCH: 29.2 pg (ref 26.0–34.0)
HX MCHC: 32.3 g/dL (ref 32.0–36.0)
HX MCV: 90.3 fL (ref 80.0–98.0)
HX MONO #: 0.6 10*3/uL (ref 0.2–0.8)
HX MONO: 8 %
HX MPV: 10.6 fL (ref 9.1–11.7)
HX NEUT #: 4.8 10*3/uL (ref 1.5–7.5)
HX NRBC #: 0 10*3/uL
HX NUCLEATED RBC: 0 %
HX PLT: 279 10*3/uL (ref 150–400)
HX RBC BLOOD COUNT: 4.32 M/uL (ref 4.20–5.50)
HX RDW: 12.4 % (ref 11.5–14.5)
HX SEG NEUT: 61 %
HX WBC: 7.9 10*3/uL (ref 4.0–11.0)

## 2018-10-18 LAB — HX CHEM-PANELS
HX ANION GAP: 10 (ref 3–14)
HX BLOOD UREA NITROGEN: 30 mg/dL — ABNORMAL HIGH (ref 6–24)
HX CHLORIDE (CL): 106 meq/L (ref 98–110)
HX CO2: 24 meq/L (ref 20–30)
HX CREATININE (CR): 1.45 mg/dL — ABNORMAL HIGH (ref 0.57–1.30)
HX GFR, AFRICAN AMERICAN: 64 mL/min/{1.73_m2}
HX GFR, NON-AFRICAN AMERICAN: 55 mL/min/{1.73_m2} — ABNORMAL LOW
HX GLUCOSE: 97 mg/dL (ref 70–139)
HX POTASSIUM (K): 4.3 meq/L (ref 3.6–5.1)
HX SODIUM (NA): 140 meq/L (ref 135–145)

## 2018-10-18 LAB — HX CHOLESTEROL
HX CHOLESTEROL: 190 mg/dL (ref 110–199)
HX HIGH DENSITY LIPOPROTEIN CHOL (HDL): 35 mg/dL (ref 35–75)
HX LDL: 117 mg/dL (ref 0–129)
HX TRIGLYCERIDES: 192 mg/dL (ref 40–250)

## 2018-10-18 LAB — HX CHEM-LIPIDS
HX CHOL-HDL RATIO: 5.4
HX CHOLESTEROL: 190 mg/dL (ref 110–199)
HX HIGH DENSITY LIPOPROTEIN CHOL (HDL): 35 mg/dL (ref 35–75)
HX HOURS FAST: 3 h
HX LDL: 117 mg/dL (ref 0–129)
HX TRIGLYCERIDES: 192 mg/dL (ref 40–250)

## 2018-10-18 LAB — HX DIABETES: HX GLUCOSE: 97 mg/dL (ref 70–139)

## 2018-10-18 NOTE — Progress Notes (Signed)
 Orders     [facility] venipuncture/phlebotomy - blood draw done in [facility] (00174) [[id number]]  .......................................Bernadene Person  October 18, 2018 3:21 PM        Created By Bernadene Person on 10/18/2018 at 03:19 PM    Electronically Signed By Sueanne Margarita RN on 10/18/2018 at 03:24 PM

## 2018-10-18 NOTE — Progress Notes (Signed)
 General Medicine Visit    Service Due by Standard Protocol Rules: COLONOSCOPY, LDL, PHQ2 SCORE, PATPORTALPIN.      Initial Screening   Ht: 68 in.  Wt: 176.6 lbs.   BMI: 26.95  Temp: 97.3 deg F.     BP (Initial [place] Screening): 131 / 81      BP (Rechecked, Actionable): 131 / 81 mmHg   HR: 119    O2 Sat: 99 %            CHIEF COMPLAINT:  Significant fatigue post-surgery.    HISTORY:  The patient underwent a left partial nephrectomy on 09/27/18 at [facility] for the removal of a 4 cm, type 1 papillary renal cell carcinoma.  He was in the hospital for 6 days.  No complications from the surgery.  He took opioids for a few days afterwards and his recovery has continued to go well except he remains very fatigued.  He does get a little dyspneic with exertion, but he denies any cough, fevers, chills, sweats, or chest pain.  He also denies any abdominal pain, nausea or vomiting.  His bowels are moving generally normally.  He is passing his urine without a problem.    He was seen by Nephrology on 10/10/18, Dr. Daphane Shepherd.  His creatinine at that time was 1.49, which is slightly higher than baseline.  His LFTs were elevated with an AST of 43 and an ALT of 113.  He tells me he was switched from rosuvastatin to atorvastatin in the hospital as he did not have rosuvastatin on formulary.  He had elevated liver enzymes in the past from use of atorvastatin.  He has since switched back to rosuvastatin beginning after his discharge around 2 weeks ago.    He is up and about every day.  He just tend to get tired in the afternoon.  His blood pressure was elevated in the hospital up to 195/100 and amlodipine was added at 10 mg per day.  His home blood pressures have been running a little bit low for the past few days, so we discontinued amlodipine around 2 days ago.  He still remains on 20 mg of lisinopril daily.    PHYSICAL EXAMINATION:  Vital signs as above.  The patient is well appearing.  His blood pressure lying down was 102/70 with a heart rate  of 100.  His blood pressure sitting up was 98/70 with heart rate of 108.  Conjunctivae are normal.  Sclerae are anicteric.  Neck is without adenopathy.  Lungs are clear bilaterally.  Heart is in a regular rhythm without murmurs.  Abdomen reveals normal active bowel sounds.  The abdomen is soft with some tenderness around the left abdomen where the surgery was.  His surgical scars are healing well.  Extremities reveal no edema.    EKG reveals a sinus tachycardia at 104.    ASSESSMENT AND PLAN:  1.  Postoperative fatigue -- His lungs are clear.  His oxygenation is excellent on room air.  I do not think this is pneumonia.  His abdominal exam is benign.  I think there is a low likelihood of him having an intraabdominal process going on.  He did have a mildly elevated white count when his blood was drawn in Nephrology Clinic on 10/10/18.  It was 13.5.  We will recheck CBC today.  We will also recheck LFTs.  Given his mild low blood pressure, I suggested he stay off the amlodipine.  He could even take the lisinopril 20  mg tablet and break it in half.  He will continue to check his blood pressure at home.  I have strongly urged him to take electrolyte fluid over the next few days and continue to try to increase his activity level.  He is reassured.  He should continue to feel better over the next few weeks.  He is not back to work yet, but plans to return in the coming weeks.  2.  Return to the office for a regular scheduled appointment or sooner if symptoms persist or worsen.      PATIENT NAME:  Brandon [name], Brandon [name]     MR #: [id number]  DATE: [id number]  DICATED BY: Wynonia Lawman M.D. SIGNED BY:   QA:[id number] DT: [id number]   [id number]    job #[id number]                          Past Medical History (prior to today's visit):  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-N20.0)  UPPER BACK PAIN (ICD-724.5) (ICD10-M54.9)  MALAISE (ICD-780.7) (ICD10-Z87.898)  PLEURITIC CHEST PAIN (ICD-786.52) (ICD10-R07.89)  HEMATURIA, GROSS (ICD-599.7)  (ICD10-R31.0)  RENAL MASS (ICD-593.9) (ICD10-N28.89)  ARTHRALGIA (ICD-719.48) (ICD10-M25.50)  CORONARY ARTERY DISEASE (ICD-414.00) (ICD10-I25.10)  HYPERTENSION (ICD-401.9) (ICD10-I10)  URINARY FREQUENCY (ICD-788.41) (ICD10-R35.0)  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-N20.0)  RENAL INSUFFICIENCY (ICD-593.9) (ICD10-N28.9)  HYPERLIPIDEMIA (ICD-272.4) (ICD10-E78.5)  HEMATURIA, MICROSCOPIC (ICD-599.72) (ICD10-R31.2)  ACROCHORDON (ICD-701.9) (ICD10-L91.8)  OBESITY (ICD-278.01) (ICD10-E66.01)  SNORING (ICD-786.09) (ICD10-R06.83)  IRRITABLE BOWEL SYNDROME (ICD-564.1) (VKF84-C37.5)    Past Medical History (changes today):  Added new problem of FATIGUE (ICD-780.79) (ICD10-R53.83)  Added new problem of 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)         Medications (prior to today's visit):  LISINOPRIL 20 MG ORAL TABLET (LISINOPRIL) Take one tablet by mouth once daily  ROSUVASTATIN CALCIUM 40 MG TAB (ROSUVASTATIN CALCIUM) TAKE 1 TABLET BY MOUTH EVERY DAY  PLAVIX 75 MG ORAL TABLET (CLOPIDOGREL BISULFATE) one by mouth daily; Route: ORAL  ASPIRIN 81 MG ORAL TABLET (ASPIRIN) Take one tablet by mouth once daily  TAMSULOSIN HCL 0.4 MG ORAL CAPSULE (TAMSULOSIN HCL) Take one capsule by mouth once daily; Route: ORAL  RA SAW PALMETTO 160 MG ORAL CAPSULE (SAW PALMETTO (SERENOA REPENS)) ; Route: ORAL  LORAZEPAM 0.5 MG ORAL TABLET (LORAZEPAM) one by mouth 30 min prior to MRI. May repeat x 1; Route: ORAL  TRIAMCINOLONE ACETONIDE 0.025 % EXTERNAL CREAM (TRIAMCINOLONE ACETONIDE) Apply to affected area bid; Route: EXTERNAL    No Changes to Medication List                    Vitals:   Ht: 68 in.  Wt: 176.6 lbs.  BMI (in-lb) 26.95  Temp: 97.3deg F.     BP (Initial [place] Screening): 131 / 81     BP (Rechecked, Actionable): 131 / 81 mmHg   Pulse Rate: 119 bpm O2 Sat: 99 %                Orders (this visit):  CBC/DIFF (with Plt) [WBC] [CPT-85025]  LYTES [SODIUM] [CPT-82495]  Blood Urea Nitrogen [BUN] [CPT-84520]  Creatinine (CR)  [CREATININE] [CPT-82565]  Glucose  [CPT-82947]  SGOT (AST/SGOT) [SGOT (AST)] [CPT-84450]  SGPT (ALT/SGPT) [SGPT (ALT)] [CPT-84460]  Alkaline Phosphatase (ALK) [ALK PHOS] [CPT-84075]  Bilirubin, Total [BILI TOTAL] [CPT-82247]  Other/Misc Orders [CPT-99999]  Lipid Profile-Chol, HDL, Trig, LDL(calc) [CHOLESTEROL] [CPT-80061]  Est Level 3 [CPT-99213]    Patient Care Plan      Immunization Worksheet 2019  Created By Bernadene Person on 10/18/2018 at 01:49 PM    Electronically Signed By Wynonia Lawman MD on 10/20/2018 at 11:22 AM

## 2018-11-03 ENCOUNTER — Ambulatory Visit

## 2018-12-04 ENCOUNTER — Ambulatory Visit

## 2018-12-04 NOTE — Progress Notes (Signed)
 Division of General Medicine - Future Orders      Requested tests to be done: __(date)____________________________        Orders     Other/Misc Orders [phone]        Special orders:        Created By Wynonia Lawman MD on 12/04/2018 at 08:23 PM    Electronically Signed By Wynonia Lawman MD on 12/04/2018 at 08:24 PM

## 2018-12-05 ENCOUNTER — Ambulatory Visit

## 2018-12-05 ENCOUNTER — Ambulatory Visit: Admitting: Internal Medicine

## 2018-12-05 NOTE — Progress Notes (Signed)
 Orders     [facility] venipuncture/phlebotomy - blood draw done in [facility] (05110) [[id number]]  . ......................................Marland KitchenSueanne Margarita, RN  December 05, 2018 4:02 PM  Elmore Guise that this is a send out lab and may take few days for results. ......................................Marland KitchenSueanne Margarita, RN  December 05, 2018 4:02 PM        Created By Sueanne Margarita RN on 12/05/2018 at 04:01 PM    Electronically Signed By Sueanne Margarita RN on 12/05/2018 at 04:03 PM

## 2018-12-06 LAB — HX TOXICOLOGY-SPECIAL: HX COPPER: 108 ug/dL

## 2019-01-03 ENCOUNTER — Ambulatory Visit

## 2019-01-03 NOTE — Telephone Encounter (Signed)
 Call Details:   Patient reports left neck and left arm pain for the past 3 and half weeks or so.  He was working on his refrigerator in a drawer started sliding out so he reflexively went catch the drawer and developed shooting pain in his left neck in the "usual spot".  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  He had been through physical therapy for this in the past and it had been helpful.  He is only taken a few Advil each day given his renal impairment.  He has oxycodone at home left over from previous surgery but he has not taken any as he tends to get a "awful withdrawal".  He does have a Medrol Dosepak at home but he was concerned about doing that given the COVID pandemic.  He also has exercises given to him by PT and he has been doing the ones that he can tolerate.  He would consider going back to PT if they are open.    He also reports a mild "dull chest pain" that comes and goes.  Sometimes on exertion he will get a "weird pain" in his jaw.  He does have known heart disease had a stent placed last year.    RESPONSE/ORDERS:    I think it is reasonable that he start the Medrol Dosepak.  He would like to get a COVID-19 test first.  He has had a mild sore throat I think this is enough to test him.  I will give him tramadol for pain.  He is never tried this before.  The pain is been getting in the way of his sleep so hopefully this will help.  I also let him know that the folks on [facility] [facility] are seeing patients for physical therapy if he would like to go.  I would also like him to have an EKG.  He is agreeable to come into the office tomorrow at around 1:00 PM and see Dr. Noel Gerold........................................Marland KitchenSherrilee Gilles, MD  January 03, 2019 7:13 PM    Jill Side -please add this patient Dr. Wells Guiles schedule tomorrow at 1:00 PM.  Dr. Noel Gerold is aware........................................Marland KitchenSherrilee Gilles, MD  January 03, 2019 7:14  PM  Noted. 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)  FATIGUE (ICD-780.79) (ICD10-R53.83)  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-N20.0)  UPPER BACK PAIN (ICD-724.5) (ICD10-M54.9)  MALAISE (ICD-780.7) (ICD10-Z87.898)  PLEURITIC CHEST PAIN (ICD-786.52) (ICD10-R07.89)  HEMATURIA, GROSS (ICD-599.7) (ICD10-R31.0)  RENAL MASS (ICD-593.9) (ICD10-N28.89)  ARTHRALGIA (ICD-719.48) (ICD10-M25.50)  CORONARY ARTERY DISEASE (ICD-414.00) (ICD10-I25.10)  HYPERTENSION (ICD-401.9) (ICD10-I10)  URINARY FREQUENCY (ICD-788.41) (ICD10-R35.0)  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-N20.0)  RENAL INSUFFICIENCY (ICD-593.9) (ICD10-N28.9)  HYPERLIPIDEMIA (ICD-272.4) (ICD10-E78.5)  HEMATURIA, MICROSCOPIC (ICD-599.72) (ICD10-R31.2)  ACROCHORDON (ICD-701.9) (ICD10-L91.8)  OBESITY (ICD-278.01) (ICD10-E66.01)  SNORING (ICD-786.09) (ICD10-R06.83)  IRRITABLE BOWEL SYNDROME (ICD-564.1) (ICD10-K58.9)    Meds (prior to this call):   LISINOPRIL 20 MG ORAL TABLET (LISINOPRIL) Take one tablet by mouth once daily  ROSUVASTATIN CALCIUM 40 MG TAB (ROSUVASTATIN CALCIUM) TAKE 1 TABLET BY MOUTH EVERY DAY  PLAVIX 75 MG ORAL TABLET (CLOPIDOGREL BISULFATE) one by mouth daily; Route: ORAL  ASPIRIN 81 MG ORAL TABLET (ASPIRIN) Take one tablet by mouth once daily  TAMSULOSIN HCL 0.4 MG ORAL CAPSULE (TAMSULOSIN HCL) Take one capsule by mouth once daily; Route: ORAL  RA SAW PALMETTO 160 MG ORAL CAPSULE (SAW PALMETTO (SERENOA REPENS)) ; Route: ORAL  LORAZEPAM 0.5 MG ORAL TABLET (LORAZEPAM) one by mouth 30 min prior to MRI. May repeat x 1; Route: ORAL  TRIAMCINOLONE ACETONIDE 0.025 % EXTERNAL CREAM (TRIAMCINOLONE  ACETONIDE) Apply to affected area bid; Route: EXTERNAL    Changes to Meds (this update):   Added new medication of TRAMADOL HCL 50 MG ORAL TABLET (TRAMADOL HCL) take 1-2 tabs twice daily as needed for pain; Route: ORAL - Signed  Rx of TRAMADOL HCL 50 MG ORAL TABLET (TRAMADOL HCL) take 1-2 tabs twice daily as needed for pain; Route: ORAL  #28[Tablet] x 0;  Signed;  Entered by: [name], MD;  Authorized by: [name], MD;  Method used: Electronically to [facility] - [address] [place], [place]  , Ph: [phone], Fax: [phone]; Note to Pharmacy: Route: ORAL;          Created By Wynonia Lawman MD on 01/03/2019 at 06:19 PM    Electronically Signed By Wynonia Lawman MD on 01/04/2019 at 11:45 AM

## 2019-01-04 ENCOUNTER — Ambulatory Visit

## 2019-01-04 ENCOUNTER — Ambulatory Visit: Admitting: Nephrology

## 2019-01-04 LAB — HX HEM-ROUTINE
HX BASO #: 0 10*3/uL (ref 0.0–0.2)
HX BASO: 1 %
HX EOSIN #: 0.4 10*3/uL (ref 0.0–0.5)
HX EOSIN: 5 %
HX HCT: 43 % (ref 37.0–47.0)
HX HGB: 14.1 g/dL (ref 13.5–16.0)
HX IMMATURE GRANULOCYTE#: 0 10*3/uL (ref 0.0–0.1)
HX IMMATURE GRANULOCYTE: 0 %
HX LYMPH #: 2.7 10*3/uL (ref 1.0–4.0)
HX LYMPH: 35 %
HX MCH: 29.1 pg (ref 26.0–34.0)
HX MCHC: 32.8 g/dL (ref 32.0–36.0)
HX MCV: 88.7 fL (ref 80.0–98.0)
HX MONO #: 0.7 10*3/uL (ref 0.2–0.8)
HX MONO: 9 %
HX MPV: 10.1 fL (ref 9.1–11.7)
HX NEUT #: 4 10*3/uL (ref 1.5–7.5)
HX NRBC #: 0 10*3/uL
HX NUCLEATED RBC: 0 %
HX PLT: 192 10*3/uL (ref 150–400)
HX RBC BLOOD COUNT: 4.85 M/uL (ref 4.20–5.50)
HX RDW: 12.7 % (ref 11.5–14.5)
HX SEG NEUT: 51 %
HX WBC: 7.8 10*3/uL (ref 4.0–11.0)

## 2019-01-04 LAB — HX CHEM-PANELS
HX ANION GAP: 8 (ref 3–14)
HX BLOOD UREA NITROGEN: 38 mg/dL — ABNORMAL HIGH (ref 6–24)
HX CHLORIDE (CL): 108 meq/L (ref 98–110)
HX CO2: 24 meq/L (ref 20–30)
HX CREATININE (CR): 1.52 mg/dL — ABNORMAL HIGH (ref 0.57–1.30)
HX GFR, AFRICAN AMERICAN: 60 mL/min/{1.73_m2}
HX GFR, NON-AFRICAN AMERICAN: 52 mL/min/{1.73_m2} — ABNORMAL LOW
HX GLUCOSE: 97 mg/dL (ref 70–139)
HX POTASSIUM (K): 4.5 meq/L (ref 3.6–5.1)
HX SODIUM (NA): 140 meq/L (ref 135–145)

## 2019-01-04 LAB — HX CHEM-OTHER: HX URIC ACID: 7.2 mg/dL (ref 3.5–8.0)

## 2019-01-04 LAB — HX BF-CHEM/URINE
HX ALBUMIN RANDOM URINE: 1.3 mg/dL
HX ALBUMIN/CREATININE RATIO, URINE: 14 mg/g (ref 0–30)
HX CREATININE, RANDOM URINE: 90.9 mg/dL
HX MICROALBUMIN CALC: 0.01 mg/mg

## 2019-01-04 LAB — HX CHEM-ENZ-FRAC
HX CREATINE KINASE (CPK/CK): 118 IU/L (ref 20–210)
HX TROPONIN I: 0.01 ng/mL (ref 0.00–0.03)

## 2019-01-04 LAB — HX DIABETES
HX ALBUMIN RANDOM URINE: 1.3 mg/dL
HX GLUCOSE: 97 mg/dL (ref 70–139)

## 2019-01-04 NOTE — Progress Notes (Signed)
 Orders     [facility] venipuncture/phlebotomy - blood draw done in PD[facility] (10175) [[id number]]  .......................................Bernadene Person  January 04, 2019 4:32 PM        Created By Bernadene Person on 01/04/2019 at 04:32 PM    Electronically Signed By Sueanne Margarita RN on 01/04/2019 at 04:58 PM

## 2019-01-05 ENCOUNTER — Ambulatory Visit

## 2019-01-05 LAB — HX MICRO-RESP VIRAL PANEL: HX COVID-19 (SARS-COV-2): NEGATIVE

## 2019-01-10 ENCOUNTER — Ambulatory Visit

## 2019-01-10 NOTE — Telephone Encounter (Signed)
 Call Details:   I spoke with patient.  He still has intermittent, random episodes of chest tightness.  He states it comes on slowly and come on at rest or with activity.  It lasts a very amount of time.  There is no associated shortness of breath or heart palpitations.  He is quite concerned that this is his heart as he feels overall "something's not right".  I asked if anything makes it better and he states he took a shower which helped the pain in his neck and also relieved his chest tightness.  He emailed Dr. Miles Costain, his cardiologist, today to ask if he should go back on Plavix for now.  He was told "it would not hurt" to plans to restart it.  He is quite anxious to have the stress test done.  We are waiting on a prior authorization.  It is currently scheduled for the day after tomorrow, 01/12/2019.  He has 2 more tablets left of his Medrol Dosepak.  He has been taking tramadol for his back pain and although things are getting better around 2 days ago he was "throwing a curtain over a door" and had exacerbation of his pain.  He met with a physical therapist once which he found helpful.  He had cervical traction.  He will be meeting with Orion Crook on Saturday to have another PT session.......................................Marland KitchenSherrilee Gilles, MD  Jan 11, 2019 11:59 AM        RESPONSE/ORDERS:    From: Guy Begin   Sent: Friday, Jan 12, 2019 12:50 PM  To: [name]  Subject: Dr. Fayette Pho Nuclear Scan    Hello Dr. [name],    This is Guy Begin, cardiology fellow.    We just read the nuclear stress for [name], Molly Maduro, which showed normal prefusion with no defects. Ischemia from prior scan was not seen in this one.    Please let me know if you have any question.    Thanks  [employer] shared with 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)  FATIGUE (ICD-780.79) (ICD10-R53.83)  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-N20.0)  UPPER BACK PAIN (ICD-724.5) (ICD10-M54.9)  MALAISE (ICD-780.7) (ICD10-Z87.898)  PLEURITIC CHEST PAIN (ICD-786.52) (ICD10-R07.89)  HEMATURIA, GROSS (ICD-599.7) (ICD10-R31.0)  RENAL MASS (ICD-593.9) (ICD10-N28.89)  ARTHRALGIA (ICD-719.48) (ICD10-M25.50)  CORONARY ARTERY DISEASE (ICD-414.00) (ICD10-I25.10)  HYPERTENSION (ICD-401.9) (ICD10-I10)  URINARY FREQUENCY (ICD-788.41) (ICD10-R35.0)  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-N20.0)  RENAL INSUFFICIENCY (ICD-593.9) (ICD10-N28.9)  HYPERLIPIDEMIA (ICD-272.4) (ICD10-E78.5)  HEMATURIA, MICROSCOPIC (ICD-599.72) (ICD10-R31.2)  ACROCHORDON (ICD-701.9) (ICD10-L91.8)  OBESITY (ICD-278.01) (ICD10-E66.01)  SNORING (ICD-786.09) (ICD10-R06.83)  IRRITABLE BOWEL SYNDROME (ICD-564.1) (ICD10-K58.9)    Meds (prior to this call):   LISINOPRIL 20 MG ORAL TABLET (LISINOPRIL) Take one tablet by mouth once daily  ROSUVASTATIN CALCIUM 40 MG TAB (ROSUVASTATIN CALCIUM) TAKE 1 TABLET BY MOUTH EVERY DAY  ASPIRIN 81 MG ORAL TABLET (ASPIRIN) Take one tablet by mouth once daily  TAMSULOSIN HCL 0.4 MG ORAL CAPSULE (TAMSULOSIN HCL) Take one capsule by mouth once daily; Route: ORAL  RA SAW PALMETTO 160 MG ORAL CAPSULE (SAW PALMETTO (SERENOA REPENS)) ; Route: ORAL  LORAZEPAM 0.5 MG ORAL TABLET (LORAZEPAM) one by mouth  30 min prior to MRI. May repeat x 1; Route: ORAL  TRIAMCINOLONE ACETONIDE 0.025 % EXTERNAL CREAM (TRIAMCINOLONE ACETONIDE) Apply to affected area bid; Route: EXTERNAL  TRAMADOL HCL 50 MG ORAL TABLET (TRAMADOL HCL) take 1-2 tabs twice daily as needed for pain; Route: ORAL  * PT FOR CERVICAL RADICULOPATHY Evaluate and treat as  indicated            Created By Wynonia Lawman MD on 01/10/2019 at 01:46 PM    Electronically Signed By Wynonia Lawman MD on 01/15/2019 at 02:23 PM

## 2019-01-12 ENCOUNTER — Ambulatory Visit

## 2019-01-12 ENCOUNTER — Ambulatory Visit: Admitting: Internal Medicine

## 2019-01-18 ENCOUNTER — Ambulatory Visit

## 2019-01-18 ENCOUNTER — Ambulatory Visit: Admitting: Nephrology

## 2019-01-18 NOTE — Progress Notes (Signed)
 * * *    [name], [name] **DOB:** Apr 16, 1966 (53 yo M) **Acc No.** [id number] **DOS:**  01/18/2019    ---       [name]**    ------    53 Y old Male, DOB: 03-16-1966, External MRN: [id number]    Account Number: [id number]    [name], [place]    Home: [phone]    Guarantor: [place], [name] Insurance: [facility] [employer]    PCP: [name], MD Referring: [name], MD    Appointment Facility: Nephrology        * * *    01/18/2019  **Appointment Provider:** Harlene Salts, MD **CHN#:** [id number]    ------     **Supervising Provider:** [name], MD    ---       **Reason for Appointment**    ---      1\. PHONE VISIT/Brandon [name] PRIMARY    ---    2\. Kidney stones, elevated creatinine    ---      **History of Present Illness**    ---     _GENERAL_ :    The patient encounter today occurred via telehealth (telemedicine) with the  patient's verbal consent.    The reason for the telehealth visit was due to the COVID-19 pandemic crisis /  federally declared state of public health emergency, and need for social  distancing. The patient denies having any known contacts with people diagnosed  with COVID-19 or any signs of active infection with COVID-19. The patient is  reasonably trying to maintain social distancing measures    Method of Telehealth used was a real-time, interactive, secure and private  audio only    Physical location of the patient was at home.    Physical location of the provider was in the hospital    Names of all persons participating in the Telehealth service (roles) other  than the patient and attending physician: Dr. Harlene Salts (nephrology fellow)    Brandon [name] is a 53yoM with PMH nephrolithiasis, HTN, CAD s/p DES (09/2017), RCC  s/p resection (09/2018), presenting for follow-up    Has generally been well. Notes occasional chest pain, as well as sharp pain in  his shoulder radiating down to his elbow. Recent stress testing was negative  for cardiac disease,  so there is higher suspicion that this is related to his  cevical radiculopathy. Recently started PT with some improvement in symptoms.    Also notes recent onset of midfoot pain; has not previously been diagnosed  with gout but he notes a history of elevated uric acid and wonders if it would  be related.    Otehrwise well, denies lightheadedness/dizziness, chest pain, dysypnea.     _Nephrology_ :    08/15/2018 stone analysis: 80% uric acid dihydrate, 20% calcium oxalate  monohydrate    Prior measurements of renal function    07/03/18: CrCl 123 and ureaCl 61 on 24hr urine collection, but creatinine was  higher than expected and may represent over-collection    07/03/18: eGFR-cr 60, eGFR-cys 108, eGFRcr-cys 82    10/10/18: eGFR-cr 53, eGFR-cys 79, eGFRcr-cys 65.      **Current Medications**    ---    Taking    * Amlodipine Besylate 5 MG Tablet 1 tablet Orally Once a day    ---    * Aspirin 81 81 MG Tablet Delayed Release as directed Orally     ---    * Crestor 40  MG Tablet 1 tablet Orally Once a day    ---    * Lisinopril 40 mg Tablet 1 tablet Orally Once a day    ---    * Medication List reviewed and reconciled with the patient    ---      **Past Medical History**    ---      Hypertension.        ---    Hypercholesterolemia.        ---    Recurrent kidney stones.        ---      **Surgical History**    ---      Closed manipulation and pinning, right middle mallet finger. 06/15/2018    ---    Ureteroscopy with Lithotripsy, Dr. 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    Work/Occupation: employed full-time.    Alcohol Socially.    Illicit drugs: Denies.  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    ---      **Allergies**    ---      Penicillin: anaphylaxis - Allergy    ---      **Review of Systems**    ---     _Nephrology_ :    CONSTITUTIONAL: Denies unintentional weight change, fatigue, fever, chills. Marland Kitchen  HEART: Denies chest pain, palpitations, edema. GASTROINTESTINAL: Denies  nausea, vomiting, diarrhea, constipation, abdominal pain, blood in stools.  RESPIRATORY: Denies shortness of breath, cough. GENITOURINARY: Denies  difficulty or pain with urination, blood in urine, rashes, ulcers.  NEUROLOGICAL SYSTEM: Denies dizziness, numbness, tingling, change in mental  status.         **Physical Examination**    ---    Speech and comprehension intact    Remainder of exam deferred for this telemedicine visit.      **Assessments**    ---    1\. 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 Coronary artery disease involving native coronary artery of native heart  without angina pectoris - I25.10    ---    3\. Hypertension, unspecified type - I10    ---    4\. 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 Hypercholesterolemia with hypertriglyceridemia - E78.2    ---    6\. Overweight - E66.3    ---    7\. 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 Elevated alanine aminotransferase (ALT) level - R74.0    ---    9\. Chronic kidney disease, stage 2 (mild) - N18.2    ---     Brandon [name] is a 53yoM with PMH nephrolithiasis, HTN, CAD s/p DES (09/2017), RCC  s/p resection (09/2018), presenting for follow-up.    ---      **Treatment**    ---      **  1\. Papillary renal cell carcinoma**    Notes: He reports that he will have CT followup first every 6 months and then  annually for several years.    ---        **2\. Coronary artery disease involving native coronary artery of native heart  without angina  pectoris**    Notes: Now off clopidogrel. On aspirin and rosuvastatin. No symptoms.        **3\. Hypertension, unspecified type**    Notes:    Had side effect with amlodipine. Goal BP <130, but he has not checked his BP  recently while having ongoing pain from cervical radiculopathy    - continue lisinopril 40mg  daily    - continue to monitor, may need benefit from doxazosin 1 mg daily in the  future        Should be evaluated for sleep apnea    .        **4\. Uric acid urolithiasis**    Notes:    Repeat 24hr urine collection, suspect uric acid will be elevated    .        **5\. Elevated alanine aminotransferase (ALT) level**    Notes: I wonder whether this is related to anaesthesia. Will repeat in a few  weeks, with CK and AST (the last more mildly elevated) make sure not related  to rosuvastatin. I wonder whether this is related to anaesthesia. Will repeat  in a few weeks, with CK and AST (the last more mildly elevated) make sure not  related to rosuvastatin.        **6\. Chronic kidney disease, stage 2 (mild)**    Notes:    He has had a moderate discordance between eGFR-cr and eGFR-cys, and a prior  urine collection in 06/2018 had unexpectedly high creatinine and may represent  over-collection. We have asked him to repeat this    - repeat 24hr urine collection and send for creatinine    .    **Appointment Provider:** Harlene Salts, MD    Electronically signed by [name] on 02/05/2019 at 10:27 AM EDT    Sign off status: Completed        * * *        Nephrology    [address]    [address]    [place], [place] [address]    Tel: [phone]    Fax: [phone]              * * *          Progress Note: Harlene Salts, MD 01/18/2019    ---    Note generated by eClinicalWorks EMR/PM Software ([email or web address])

## 2019-01-18 NOTE — Progress Notes (Signed)
 .  Progress Notes  .  Patient: Brandon [name]  Provider: Harlene Salts    .  DOB: 08-Jul-1966 Age: 53 Y Sex: Male  Supervising Provider:: [name], MD  Date: 01/18/2019  .  PCP: [name] MD  Date: 01/18/2019  .  --------------------------------------------------------------------------------  .  REASON FOR APPOINTMENT  .  1. PHONE VISIT/[facility] PRIMARY  .  2. Kidney stones, elevated creatinine  .  HISTORY OF PRESENT ILLNESS  .  GENERAL:   The patient encounter today occurred via telehealth  (telemedicine) with the patient's verbal consent.The reason for  the telehealth visit was due to the COVID-19 pandemic crisis /  federally declared state of public health emergency, and need for  social distancing. The patient denies having any known contacts  with people diagnosed with COVID-19 or any signs of active  infection with COVID-19. The patient is reasonably trying to  maintain social distancing measuresMethod of Telehealth used was  a real-time, interactive, secure and private audio onlyPhysical  location of the patient was at home.Physical location of the  provider was in the hospitalNames of all persons participating in  the Telehealth service (roles) other than the patient and  attending physician: Dr. Harlene Salts (nephrology fellow)Brandon [name]  is a 53yoM with PMH nephrolithiasis, HTN, CAD s/p DES (09/2017),  RCC s/p resection (09/2018), presenting for follow-upHas generally  been well. Notes occasional chest pain, as well as sharp pain in  his shoulder radiating down to his elbow. Recent stress testing  was negative for cardiac disease, so there is higher suspicion  that this is related to his cevical radiculopathy. Recently  started PT with some improvement in symptoms.Also notes recent  onset of midfoot pain; has not previously been diagnosed with  gout but he notes a history of elevated uric acid and wonders if  it would be related.Otehrwise well, denies  lightheadedness/dizziness, chest pain,  dysypnea.  .  Nephrology:   08/15/2018 stone analysis: 80% uric acid dihydrate, 20% calcium  oxalate monohydratePrior measurements of renal function10/28/19:  CrCl 123 and ureaCl 61 on 24hr urine collection, but creatinine  was higher than expected and may represent  over-collection10/28/19: eGFR-cr 60, eGFR-cys 108, eGFRcr-cys  8200/12/24: eGFR-cr 53, eGFR-cys 79, eGFRcr-cys 65.  Marland Kitchen  CURRENT MEDICATIONS  .  Taking Amlodipine Besylate 5 MG Tablet 1 tablet Orally Once a day  Taking Aspirin 81 81 MG Tablet Delayed Release as directed Orally  Taking Crestor 40 MG Tablet 1 tablet Orally Once a day  Taking Lisinopril 40 mg Tablet 1 tablet Orally Once a day  Medication List reviewed and reconciled with the patient  .  PAST MEDICAL HISTORY  .  Hypertension  Hypercholesterolemia  Recurrent kidney stones  .  ALLERGIES  .  Penicillin: anaphylaxis - Allergy  .  SURGICAL HISTORY  .  Closed manipulation and pinning, right middle mallet finger.  06/15/2018  Ureteroscopy with Lithotripsy, Dr. 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  FAMILY HISTORY  .  Mother: alive, hypertension, diagnosed with ADHD,  hyperactive-impulsive type  Father: cabg; cad, ADHD, hyperactive-impulsive type  Siblings: sister healthy  .  SOCIAL HISTORY  .  .  Tobacco  history: Never smoked.  .  .  Work/Occupation: employed full-time.  .  .  Alcohol  Socially.  .  .  Illicit drugs: Denies.  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  .  REVIEW OF SYSTEMS  .  Nephrology:  .  CONSTITUTIONAL:    Denies unintentional weight change, fatigue,  fever, chills.  Marland Kitchen HEART:    Denies chest pain, palpitations,  edema . GASTROINTESTINAL:    Denies nausea, vomiting, diarrhea,  constipation, abdominal pain, blood in stools . RESPIRATORY:     Denies shortness of breath, cough . GENITOURINARY:     Denies  difficulty or pain with urination, blood in urine, rashes, ulcers  . NEUROLOGICAL SYSTEM:    Denies dizziness, numbness, tingling,  change in mental status .  Marland Kitchen  PHYSICAL EXAMINATION  .  Speech and comprehension intactRemainder of exam deferred for  this telemedicine visit.  .  ASSESSMENTS  .  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  Coronary artery disease involving native coronary artery of  native heart without angina pectoris - I25.10  .  Hypertension, unspecified type - I10  .  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  Hypercholesterolemia with hypertriglyceridemia - E78.2  .  Overweight - E66.3  .  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  Elevated alanine aminotransferase (ALT) level - R74.0  .  Chronic kidney disease, stage 2 (mild) - N18.2  .  Brandon [name] is a 53yoM with PMH nephrolithiasis, HTN, CAD s/p DES  (09/2017), RCC s/p resection (09/2018), presenting for follow-up.  .  TREATMENT  .  Papillary renal cell carcinoma  Notes: He reports that he will have CT followup first every 6  months and then annually for several years.  .  .  Coronary artery disease involving native coronary  artery of native heart without angina pectoris  Notes: Now off clopidogrel. On aspirin and rosuvastatin. No  symptoms.  .  .  Hypertension, unspecified type  Notes:  .  Had side effect with amlodipine. Goal BP <130, but he has not  checked his BP recently while having ongoing pain from cervical  radiculopathy  - continue lisinopril 40mg  daily  - continue to monitor, may need benefit from doxazosin 1 mg daily  in the future  .  Should be evaluated for sleep apnea  .  .  .  .  Uric acid urolithiasis  Notes:  .  Repeat 24hr urine collection, suspect uric acid will be elevated  .  .  .  .  Elevated alanine aminotransferase (ALT) level  Notes: I wonder whether this is related to anaesthesia. Will  repeat in a few weeks, with CK and AST (the last more mildly  elevated) make sure not related to  rosuvastatin. I wonder whether  this is related to anaesthesia. Will repeat in a few weeks, with  CK and AST (the last more mildly elevated) make sure not related  to rosuvastatin.  .  .  Chronic kidney disease, stage 2 (mild)  Notes:  .  He has had a moderate discordance between eGFR-cr and eGFR-cys,  and a prior urine collection in 06/2018 had unexpectedly high  creatinine and may represent over-collection. We have asked him  to repeat this  - repeat 24hr urine collection and send for creatinine  .  .  .  Marland Kitchen  Appointment Provider: Harlene Salts, MD  .  Electronically signed by [name] on  02/05/2019 at 10:27 AM EDT  .  CONFIRMATORY SIGN OFF  MEYER,KLEMENS B 02/05/2019 10:27:38 AM > , I personally interviewed and examined the patient and both  the fellow and I contributed to this electronic note. I agree with the history, exam, assessment and plan as detailed in this note and edited it as necessary.   .  Document electronically signed by Harlene Salts    .

## 2019-01-19 ENCOUNTER — Ambulatory Visit

## 2019-01-22 ENCOUNTER — Ambulatory Visit

## 2019-01-22 ENCOUNTER — Ambulatory Visit: Admitting: Infectious Disease

## 2019-01-23 LAB — HX MICRO-RESP VIRAL PANEL: HX COVID-19 (SARS-COV-2): NEGATIVE

## 2019-02-21 ENCOUNTER — Ambulatory Visit

## 2019-02-21 NOTE — Progress Notes (Signed)
 [facility] February 21, 2019  [address]   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  He would like to change his parking lot to the [place] [address] to limit his distance to walk to the hospital for the duration of his treatment, approximately 6 weeks.    Sincerely,        [name], MD  [facility]  [phone]      Created By Brandon Lawman MD on 02/21/2019 at 12:38 PM    Electronically Signed By Brandon Lawman MD on 03/02/2019 at 09:52 AM

## 2019-02-26 ENCOUNTER — Ambulatory Visit

## 2019-02-26 ENCOUNTER — Ambulatory Visit: Admitting: Nephrology

## 2019-02-26 LAB — HX DIABETES: HX GLUCOSE: 87 mg/dL (ref 70–139)

## 2019-02-26 LAB — HX CHEM-PANELS
HX ANION GAP: 8 (ref 3–14)
HX BLOOD UREA NITROGEN: 29 mg/dL — ABNORMAL HIGH (ref 6–24)
HX CHLORIDE (CL): 109 meq/L (ref 98–110)
HX CO2: 23 meq/L (ref 20–30)
HX CREATININE (CR): 1.36 mg/dL — ABNORMAL HIGH (ref 0.57–1.30)
HX GFR, AFRICAN AMERICAN: 69 mL/min/{1.73_m2}
HX GFR, NON-AFRICAN AMERICAN: 59 mL/min/{1.73_m2} — ABNORMAL LOW
HX GLUCOSE: 87 mg/dL (ref 70–139)
HX POTASSIUM (K): 4.2 meq/L (ref 3.6–5.1)
HX SODIUM (NA): 140 meq/L (ref 135–145)

## 2019-02-26 LAB — HX BF-CHEM/URINE
HX CALCIUM TIMED CALC, URINE (MG/SPEC): 118 mg/{collection} (ref 100–300)
HX CALCIUM URINE TIMED CALC(G/SPEC): 0.12 g/colln (ref 0.10–0.30)
HX CREATININE TIMED CAL: 2.07 g/colln — ABNORMAL HIGH (ref 0.80–1.80)
HX CREATININE URINE TIMED CALC (MG/SPEC): 2072 mg/{collection} — ABNORMAL HIGH (ref 800–1800)
HX PHOSPHORUS TIMED CALC, URINE (G/SPEC): 1.48 g/colln — ABNORMAL HIGH (ref 0.34–1.00)
HX PHOSPHORUS TIMED CALC, URINE (MG/SPEC): 1485 mg/{collection} — ABNORMAL HIGH (ref 340–1000)
HX SODIUM TIMED CALC, URINE: 264 mEq/colln — ABNORMAL HIGH (ref 40–220)
HX UREA NITROGEN TIMED CALC, URINE (G/SPEC): 26.15 g/colln — ABNORMAL HIGH (ref 6.00–17.00)
HX UREA NITROGEN TIMED CALC, URINE (MG/SPEC): 26152 mg/{collection} — ABNORMAL HIGH (ref 6000–17000)
HX URIC ACID TIMED CALC, URINE (G/SPEC): 0.6 g/colln (ref 0.3–0.8)
HX URIC ACID URINE TIMED CALC(MG/SPEC): 550 mg/{collection} (ref 250–750)
HX URINE TIME SPAN: 24 h
HX VOLUME, URINE, TIMED: 2750 mL

## 2019-02-26 LAB — HX CHEM-OTHER
HX ALBUMIN: 4.7 g/dL (ref 3.4–4.8)
HX CALCIUM (CA): 9.5 mg/dL (ref 8.5–10.5)
HX MAGNESIUM: 2 mg/dL (ref 1.6–2.6)
HX PHOSPHORUS: 3.2 mg/dL (ref 2.7–4.5)

## 2019-03-08 ENCOUNTER — Ambulatory Visit: Admitting: Nephrology

## 2019-03-08 ENCOUNTER — Ambulatory Visit

## 2019-03-08 NOTE — Progress Notes (Signed)
 * * *    [name], [name] **DOB:** 1965-09-13 (53 yo M) **Acc No.** [id number] **DOS:**  03/08/2019    ---       Brandon [name]**    ------    14 Y old Male, DOB: [birth year], External MRN: [id number]    Account Number: [id number]    [address] [name], [place]    Home: [phone]    Guarantor: [place], [name] Insurance: [facility] P[employer]    PCP: Brandon Gilles, MD Referring: Brandon Gilles, MD    Appointment Facility: Nephrology        * * *    03/08/2019  **Appointment Provider:** Harlene Salts, MD **CHN#:** [id number]    ------     **Supervising Provider:** [name], MD    ---       **Reason for Appointment**    ---      1\. Kidney stones, elevated creatinine    ---      **History of Present Illness**    ---     _GENERAL_ :    Brandon [name] is a 53yoM with PMH nephrolithiasis, HTN, CAD s/p DES (09/2017), RCC  s/p resection (09/2018), presenting for follow-up    Reports being generally well, no concerns. No recent hospitalizations, ED  visits. Notes some increase in weight which he attributes to a change in diet    Thinks he may have a had a stone a couple days ago, had some L-sided  discomfort which resolved spontaneously    24hr urine collection notable for adequate volume ( ) and low citrate  (74).     _Nephrology_ :    08/15/2018 stone analysis: 80% uric acid dihydrate, 20% calcium oxalate  monohydrate    Prior measurements of renal function    07/03/18: CrCl 123 and ureaCl 61 on 24hr urine collection, but creatinine was  higher than expected and may represent over-collection    07/03/18: eGFR-cr 60, eGFR-cys 108, eGFRcr-cys 82    10/10/18: eGFR-cr 53, eGFR-cys 79, eGFRcr-cys 65    02/26/19: CrCl 106, ureaCl 63 on 24hr urine collection.      **Current Medications**    ---    Taking    * Amlodipine Besylate 5 MG Tablet 1 tablet Orally Once a day    ---    * Aspirin 81 81 MG Tablet Delayed Release as directed Orally     ---    * Crestor 40 MG Tablet 1 tablet Orally Once a day    ---     * Lisinopril 40 mg Tablet 1 tablet Orally Once a day    ---    * Medication List reviewed and reconciled with the patient    ---      **Past Medical History**    ---      Hypertension.        ---    Hypercholesterolemia.        ---    Recurrent kidney stones.        ---      **Surgical History**    ---      Closed manipulation and pinning, right middle mallet finger. 06/15/2018    ---    Ureteroscopy with Lithotripsy, Dr. 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    Work/Occupation: employed full-time.    Alcohol Socially.    Illicit drugs: Denies.  Patient is married. Two children. Works as a  Marine scientist.    ---      **Allergies**    ---      Penicillin: anaphylaxis - Allergy    ---      **Review of Systems**    ---     _Nephrology_ :    CONSTITUTIONAL: Denies unintentional weight change, fatigue, fever, chills. Marland Kitchen  HENT: Denies headaches, auditory changes, metallic taste, dysphagia. HEART:  Denies chest pain, palpitations, edema. GASTROINTESTINAL: Denies nausea,  vomiting, diarrhea, constipation, abdominal pain, blood in stools.  RESPIRATORY: Denies shortness of breath, cough. NEUROLOGICAL SYSTEM: Denies  dizziness, numbness, tingling, change in mental status.         **Vital Signs**    ---    Ht-in 68, Wt-lbs 191.6, BMI 29.13, BSA 2.04, Ht-cm 172.72, Wt-kg 86.91, Wt  Change 7 lb.      **Physical Examination**    ---     _NEPHROLOGY_ :    CONSTITUTIONAL / VITALS: Appears well and in no apparent distress. Weight is  unchanged from previous  visit.    HEENT: Eyes with no pallor or icterus. Oropharynx clear and oral mucous  membrane moist.    CVS: JVD not elevated. No carotid bruit. S1 and S2 normal, regular rate and  rhythm. No murmur, rub or gallop. No lower extremity or sacral edema.    LUNGS: Clear to auscultation bilaterally.    ABDOMEN: Not distended, soft and non-tender. No hepato-splenomegaly. Bowel  sounds present. No abdominal bruit.    EXTREMITIES: No joint swelling. No clubbing. Peripheral pulses palpable.    SKIN: No pallor, icterus, bruise or rash.         **Assessments**    ---    1\. Nephrolithiasis - N20.0 (Primary)    ---    2\. 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 Hypertension, unspecified type - I10    ---    4\. 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 Coronary artery disease involving native coronary artery of native heart  without angina pectoris - I25.10    ---    6\. 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    ---      **Treatment**    ---      **1\. Nephrolithiasis**    Notes: 24hr urine collection notable for low citrate    - start Urocit K 30 mEq BID    - repeat 24hr urine collection for stone analysis when patient is able    - continue to encourage adequate fluid intake (at least 2L a day)    - check a serum uric acid with next labs, consider allopurinol if it remains  elevated    .    ---        **2\. Chronic kidney disease, stage 2 (mild)**    Notes:    He has had a moderate discordance between eGFR-cr and eGFR-cys. Most recent  24hr urine collection yields CrCl 106, UreaCl 63    - repeat cystatin C with next lab    - I estimate his true GFR is in the 80s    .        **3\. Hypertension, unspecified type**    Notes:    Had side effect with amlodipine. Goal BP <130,  but he has not checked his BP  recently while having ongoing pain from cervical radiculopathy    - continue lisinopril 40mg  daily    - continue to monitor, may need benefit from doxazosin 1 mg daily in the  future        Should be evaluated for sleep apnea    .        **4\. Papillary renal cell carcinoma**    Notes: He reports that he will have CT followup first every 6 months and then  annually for several years.        **5\. Coronary artery disease involving native coronary artery of native heart  without angina pectoris**    Notes: Now off clopidogrel. On aspirin and rosuvastatin. No symptoms.        **6\. Uric acid urolithiasis**    Start Urocit-K 15 Tablet Extended Release, 15 MEQ (1620 MG), 2 tablet with  meals, Orally, Twice a day, 30 day(s), 120 Tablet, Refills 3       **Labs**    ------    _Lab: [facility] Urinalysis_    ---       Value Reference Range    ---------    SG 1.020     pH 5     ------------    LEU neg     ------------    NIT neg     ------------    PRO neg     ------------    GLU norm     ------------    KET neg     ------------    UBG norm     ------------    BIL neg     ------------    BLD neg     ------------    **Appointment Provider:** Harlene Salts, MD    Electronically signed by [name] on 07/16/2019 at 04:03 PM EST    Sign off status: Completed        * * *        Nephrology    [address]    [address] 4[address] floor    [place], [place] [address]    Tel: [phone]    Fax: [phone]              * * *          Progress Note: Harlene Salts, MD 03/08/2019    ---    Note generated by eClinicalWorks EMR/PM Software ([email or web address])

## 2019-03-08 NOTE — Progress Notes (Signed)
 .  Progress Notes  .  Patient: Brandon [name], Brandon [name]  Provider: Harlene Salts    .  DOB: [birth year] Age: 53 Y Sex: Male  Supervising Provider:: [name], MD  Date: 03/08/2019  .  PCP: [name] MD  Date: 03/08/2019  .  --------------------------------------------------------------------------------  .  REASON FOR APPOINTMENT  .  1. Kidney stones, elevated creatinine  .  HISTORY OF PRESENT ILLNESS  .  GENERAL:   Brandon [name] is a 53yoM with PMH nephrolithiasis, HTN, CAD s/p DES  (09/2017), RCC s/p resection (09/2018), presenting for  follow-upReports being generally well, no concerns. No recent  hospitalizations, ED visits. Notes some increase in weight which  he attributes to a change in dietThinks he may have a had a stone  a couple days ago, had some L-sided discomfort which resolved  spontaneously24hr urine collection notable for adequate volume  ( ) and low citrate (74).  .  Nephrology:   08/15/2018 stone analysis: 80% uric acid dihydrate, 20% calcium  oxalate monohydratePrior measurements of renal function10/28/19:  CrCl 123 and ureaCl 61 on 24hr urine collection, but creatinine  was higher than expected and may represent  over-collection10/28/19: eGFR-cr 60, eGFR-cys 108, eGFRcr-cys  8200/12/24: eGFR-cr 53, eGFR-cys 79, eGFRcr-cys 656/22/20: CrCl  106, ureaCl 63 on 24hr urine collection.  .  CURRENT MEDICATIONS  .  Taking Amlodipine Besylate 5 MG Tablet 1 tablet Orally Once a day  Taking Aspirin 81 81 MG Tablet Delayed Release as directed Orally  Taking Crestor 40 MG Tablet 1 tablet Orally Once a day  Taking Lisinopril 40 mg Tablet 1 tablet Orally Once a day  Medication List reviewed and reconciled with the patient  .  PAST MEDICAL HISTORY  .  Hypertension  Hypercholesterolemia  Recurrent kidney stones  .  ALLERGIES  .  Penicillin: anaphylaxis - Allergy  .  SURGICAL HISTORY  .  Closed manipulation and pinning, right middle mallet finger.  06/15/2018  Ureteroscopy with Lithotripsy, Dr. Dolan Amen  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  FAMILY HISTORY  .  Mother: alive, hypertension, diagnosed with ADHD,  hyperactive-impulsive type  Father: cabg; cad, ADHD, hyperactive-impulsive type  Siblings: sister healthy  .  SOCIAL HISTORY  .  .  Tobacco  history: Never smoked.  .  .  Work/Occupation: employed full-time.  .  .  Alcohol  Socially.  .  .  Illicit drugs: Denies.  .  Patient is married. Two children. Works as a Marine scientist.  .  REVIEW OF SYSTEMS  .  Nephrology:  .  CONSTITUTIONAL:    Denies unintentional weight change, fatigue,  fever, chills.  Marland Kitchen HENT:    Denies headaches, auditory changes,  metallic taste, dysphagia . HEART:    Denies chest pain,  palpitations, edema . GASTROINTESTINAL:    Denies nausea,  vomiting, diarrhea, constipation, abdominal pain, blood in stools  . RESPIRATORY:    Denies shortness of breath, cough .  NEUROLOGICAL SYSTEM:    Denies dizziness, numbness, tingling,  change in mental status .  Marland Kitchen  VITAL SIGNS  .  Ht-in 68, Wt-lbs 191.6, BMI 29.13, BSA 2.04, Ht-cm 172.72, Wt-kg  86.91, Wt Change 7 lb.  .  PHYSICAL EXAMINATION  .  NEPHROLOGY:  CONSTITUTIONAL / VITALS:  Appears well and in no apparent  distress. Weight is unchanged from previous visit.  HEENT:  Eyes with  no pallor or icterus. Oropharynx clear and oral  mucous membrane moist.  CVS:  JVD not elevated. No carotid bruit. S1 and S2 normal,  regular rate and rhythm. No murmur, rub or gallop. No lower  extremity or sacral edema.  LUNGS:  Clear to auscultation bilaterally.  ABDOMEN:  Not distended, soft and non-tender. No  hepato-splenomegaly. Bowel sounds present. No abdominal bruit.  EXTREMITIES:  No joint swelling. No clubbing. Peripheral pulses  palpable.  SKIN:  No pallor, icterus, bruise or rash.  .  ASSESSMENTS  .  Nephrolithiasis - N20.0 (Primary)  .  Chronic kidney disease,  stage 2 (mild) - N18.2  .  Hypertension, unspecified type - I10  .  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  Coronary artery disease involving native coronary artery of  native heart without angina pectoris - I25.10  .  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  Brandon [name] is a 53yoM with PMH nephrolithiasis, HTN, CAD s/p DES  (09/2017), RCC s/p resection (09/2018), presenting for follow-up.  .  TREATMENT  .  Nephrolithiasis  Notes: 24hr urine collection notable for low citrate  - start Urocit K 30 mEq BID  - repeat 24hr urine collection for stone analysis when patient is  able  - continue to encourage adequate fluid intake (at least 2L a day)  - check a serum uric acid with next labs, consider allopurinol if  it remains elevated  .  .  .  Chronic kidney disease, stage 2 (mild)  Notes:  .  He has had a moderate discordance between eGFR-cr and eGFR-cys.  Most recent 24hr urine collection yields CrCl 106, UreaCl 63  - repeat cystatin C with next lab  - I estimate his true GFR is in the 80s  .  .  .  .  Hypertension, unspecified type  Notes:  .  Had side effect with amlodipine. Goal BP <130, but he has not  checked his BP recently while having ongoing pain from cervical  radiculopathy  - continue lisinopril 40mg  daily  - continue to monitor, may need benefit from doxazosin 1 mg daily  in the future  .  Should be evaluated for sleep apnea  .  .  .  .  Papillary renal cell carcinoma  Notes: He reports that he will have CT followup first every 6  months and then annually for several years.  .  .  Coronary artery disease involving native coronary  artery of native heart without angina pectoris  Notes: Now off clopidogrel. On aspirin and rosuvastatin. No  symptoms.  .  .  Uric acid urolithiasis  Start Urocit-K 15 Tablet Extended Release, 15 MEQ (1620 MG), 2  tablet with meals, Orally, Twice a day, 30 day(s), 120 Tablet,  Refills 3  .  LABS  .   LAB:  [facility] Urinalysis  .     SG     1.020     ()     pH     5     ()     LEU     neg     ()     NIT     neg     ()     PRO     neg     ()     GLU     norm     ()     KET  neg     ()     UBG     norm     ()     BIL     neg     ()     BLD     neg     ()  .  .  .  Appointment Provider: Harlene Salts, MD  .  Electronically signed by [name] on  07/16/2019 at 04:03 PM EST  .  CONFIRMATORY SIGN OFF  MEYER,KLEMENS B 07/16/2019 4:03:19 PM > , I personally interviewed and examined the patient and both the fellow and I contributed to this electronic note. I agree with the history, exam, assessment and plan as detailed in this note and edited it as necessary.   .  Document electronically signed by Harlene Salts    .

## 2019-03-10 LAB — BMP (EXT)
Anion Gap (EXT): 6 mmol/L (ref 2–15)
Anion Gap (EXT): 9 mmol/L (ref 2–15)
BUN (EXT): 56 mg/dL — ABNORMAL HIGH (ref 7–24)
BUN (EXT): 52 mg/dL — ABNORMAL HIGH (ref 7–24)
CO2 (EXT): 17 mmol/L — ABNORMAL LOW (ref 24–32)
CO2 (EXT): 18 mmol/L — ABNORMAL LOW (ref 24–32)
CalciumCalcium (EXT): 9 mg/dL (ref 8.5–10.5)
CalciumCalcium (EXT): 8.4 mg/dL — ABNORMAL LOW (ref 8.5–10.5)
Chloride (EXT): 105 mmol/L (ref 98–110)
Chloride (EXT): 111 mmol/L — ABNORMAL HIGH (ref 98–110)
Creatinine (EXT): 1.8 mg/dL — ABNORMAL HIGH (ref 0.6–1.3)
Creatinine (EXT): 1.6 mg/dL — ABNORMAL HIGH (ref 0.6–1.3)
Glucose (EXT): 100 mg/dL (ref 70–118)
Glucose (EXT): 96 mg/dL (ref 70–118)
Potassium (EXT): 4.4 mmol/L (ref 3.4–5.2)
Sodium (EXT): 128 mmol/L — ABNORMAL LOW (ref 135–146)
Sodium (EXT): 138 mmol/L (ref 135–146)
eGFR - Creat MDRD (EXT): 40 mL/min/BSA — ABNORMAL LOW (ref >=60)
eGFR - Creat MDRD (EXT): 48 mL/min/BSA — ABNORMAL LOW (ref >=60)
eGFR - Creat MDRD (EXT): 46 mL/min/BSA — ABNORMAL LOW (ref >=60)
eGFR - Creat MDRD (EXT): 55 mL/min/BSA — ABNORMAL LOW (ref >=60)

## 2019-03-10 LAB — UNMAPPED LAB RESULTS

## 2019-03-13 LAB — HX BF-CHEM/URINE
HX CREATININE, URINE, TIMED FOR MG: 1.74 g/(24.h)
HX MAGNESIUM, URINE TIMED (MG/G CR): 99 mg/(24.h)
HX U OXALATE 24 HR: 33 mg/(24.h)
HX URINE VOL FOR U OXALATE 24 HR: 2750
HX URINE VOLUME 21: 2750
HX VOLUME, TIMED URINE: 2750
HX VOLUME, TIMED URINE: 2750 mL

## 2019-03-13 LAB — HX TOXICOLOGY-SPECIAL: HX U CITRATE, 24 HR: 74 mg/(24.h) — ABNORMAL LOW

## 2019-05-10 ENCOUNTER — Ambulatory Visit

## 2019-05-21 ENCOUNTER — Ambulatory Visit (HOSPITAL_BASED_OUTPATIENT_CLINIC_OR_DEPARTMENT_OTHER): Admitting: Optometrist

## 2019-05-21 ENCOUNTER — Ambulatory Visit

## 2019-05-21 ENCOUNTER — Ambulatory Visit: Admitting: Optometrist

## 2019-05-23 ENCOUNTER — Ambulatory Visit

## 2019-05-23 NOTE — Telephone Encounter (Signed)
 Call Details:   S,  Did you give me a script for tramadol the last time I had neck issues?  And didn?t I tell you it really helped?  [place] the driving [place]?  oops, that was a mistake.  R      RESPONSE/ORDERS:    Yes, and yes.  S.......................................Marland KitchenSherrilee Gilles, MD  May 23, 2019 11:46 AM    Never heard back from pt.......................................Marland KitchenSherrilee Gilles, MD  May 31, 2019 11:47 AM               ORDERS/PROBS/MEDS/ALL     Problems:   CHEST PAIN (ICD-786.50) (ICD10-R07.9)  MYALGIA (ICD-729.1) (ICD10-M79.10)  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)  CERVICAL RADICULOPATHY (ICD-723.4) (ICD10-M54.12)  PAIN, CHEST (ICD-786.50) (ICD10-R07.9)  EXPOSURE TO OTHER HAZARDOUS METALS (ICD-V87.09) (ICD10-Z77.018)  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)  FATIGUE (ICD-780.79) (ICD10-R53.83)  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-N20.0)  UPPER BACK PAIN (ICD-724.5) (ICD10-M54.9)  MALAISE (ICD-780.7) (ICD10-Z87.898)  PLEURITIC CHEST PAIN (ICD-786.52) (ICD10-R07.89)  HEMATURIA, GROSS (ICD-599.7) (ICD10-R31.0)  RENAL MASS (ICD-593.9) (ICD10-N28.89)  ARTHRALGIA (ICD-719.48) (ICD10-M25.50)  CORONARY ARTERY DISEASE (ICD-414.00) (ICD10-I25.10)  HYPERTENSION (ICD-401.9) (ICD10-I10)  URINARY FREQUENCY (ICD-788.41) (ICD10-R35.0)  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-N20.0)  RENAL INSUFFICIENCY (ICD-593.9) (ICD10-N28.9)  HYPERLIPIDEMIA (ICD-272.4) (ICD10-E78.5)  HEMATURIA, MICROSCOPIC (ICD-599.72) (ICD10-R31.2)  ACROCHORDON (ICD-701.9) (ICD10-L91.8)  OBESITY (ICD-278.01) (ICD10-E66.01)  SNORING (ICD-786.09) (ICD10-R06.83)  IRRITABLE BOWEL SYNDROME (ICD-564.1) (ICD10-K58.9)    Meds (prior to this call):   LISINOPRIL 20 MG ORAL TABLET (LISINOPRIL) Take one tablet by mouth once daily  ROSUVASTATIN CALCIUM 40 MG TAB (ROSUVASTATIN CALCIUM) TAKE 1 TABLET BY MOUTH EVERY DAY  ASPIRIN 81 MG ORAL TABLET (ASPIRIN) Take one tablet by mouth once daily  TAMSULOSIN HCL 0.4 MG ORAL  CAPSULE (TAMSULOSIN HCL) Take one capsule by mouth once daily; Route: ORAL  RA SAW PALMETTO 160 MG ORAL CAPSULE (SAW PALMETTO (SERENOA REPENS)) ; Route: ORAL  LORAZEPAM 0.5 MG ORAL TABLET (LORAZEPAM) one by mouth 30 min prior to MRI. May repeat x 1; Route: ORAL  TRIAMCINOLONE ACETONIDE 0.025 % EXTERNAL CREAM (TRIAMCINOLONE ACETONIDE) Apply to affected area bid; Route: EXTERNAL            Created By Wynonia Lawman MD on 05/23/2019 at 03:31 PM    Electronically Signed By Wynonia Lawman MD on 05/31/2019 at 11:47 AM

## 2019-06-04 ENCOUNTER — Ambulatory Visit: Admitting: Nephrology

## 2019-06-20 ENCOUNTER — Ambulatory Visit

## 2019-06-20 ENCOUNTER — Ambulatory Visit: Admitting: Infectious Disease

## 2019-06-20 LAB — HX MICRO-RESP VIRAL PANEL: HX COVID-19 (SARS-COV-2): NEGATIVE

## 2019-08-07 ENCOUNTER — Ambulatory Visit

## 2019-08-07 NOTE — Telephone Encounter (Signed)
 Call Details:   Brandon [name],  I?m freaked out over my swallowing issues.  I think I should get EGD?d ASAP.  I scheduled an endoscopy/colonoscopy at [facility] but the earliest they had was second week of February.    :(  Brandon [name]    Sent from my iPhone    RESPONSE/ORDERS:    Pt seen today in ofc.......................................Marland KitchenSherrilee Gilles, MD  August 08, 2019 3:08 PM               ORDERS/PROBS/MEDS/ALL     Problems:   CHEST PAIN (ICD-786.50) (ICD10-R07.9)  MYALGIA (ICD-729.1) (ICD10-M79.10)  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)  CERVICAL RADICULOPATHY (ICD-723.4) (ICD10-M54.12)  PAIN, CHEST (ICD-786.50) (ICD10-R07.9)  EXPOSURE TO OTHER HAZARDOUS METALS (ICD-V87.09) (ICD10-Z77.018)  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)  FATIGUE (ICD-780.79) (ICD10-R53.83)  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-N20.0)  UPPER BACK PAIN (ICD-724.5) (ICD10-M54.9)  MALAISE (ICD-780.7) (ICD10-Z87.898)  PLEURITIC CHEST PAIN (ICD-786.52) (ICD10-R07.89)  HEMATURIA, GROSS (ICD-599.7) (ICD10-R31.0)  RENAL MASS (ICD-593.9) (ICD10-N28.89)  ARTHRALGIA (ICD-719.48) (ICD10-M25.50)  CORONARY ARTERY DISEASE (ICD-414.00) (ICD10-I25.10)  HYPERTENSION (ICD-401.9) (ICD10-I10)  URINARY FREQUENCY (ICD-788.41) (ICD10-R35.0)  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-N20.0)  RENAL INSUFFICIENCY (ICD-593.9) (ICD10-N28.9)  HYPERLIPIDEMIA (ICD-272.4) (ICD10-E78.5)  HEMATURIA, MICROSCOPIC (ICD-599.72) (ICD10-R31.2)  ACROCHORDON (ICD-701.9) (ICD10-L91.8)  OBESITY (ICD-278.01) (ICD10-E66.01)  SNORING (ICD-786.09) (ICD10-R06.83)  IRRITABLE BOWEL SYNDROME (ICD-564.1) (ICD10-K58.9)    Meds (prior to this call):   LISINOPRIL 20 MG ORAL TABLET (LISINOPRIL) Take one tablet by mouth once daily  ROSUVASTATIN CALCIUM 40 MG TAB (ROSUVASTATIN CALCIUM) TAKE 1 TABLET BY MOUTH EVERY DAY  ASPIRIN 81 MG ORAL TABLET (ASPIRIN) Take one tablet by mouth once daily  TAMSULOSIN HCL 0.4 MG ORAL CAPSULE (TAMSULOSIN HCL) Take one capsule by mouth once daily;  Route: ORAL  RA SAW PALMETTO 160 MG ORAL CAPSULE (SAW PALMETTO (SERENOA REPENS)) ; Route: ORAL  LORAZEPAM 0.5 MG ORAL TABLET (LORAZEPAM) one by mouth 30 min prior to MRI. May repeat x 1; Route: ORAL  TRIAMCINOLONE ACETONIDE 0.025 % EXTERNAL CREAM (TRIAMCINOLONE ACETONIDE) Apply to affected area bid; Route: EXTERNAL            Created By Wynonia Lawman MD on 08/07/2019 at 11:33 AM    Electronically Signed By Wynonia Lawman MD on 08/08/2019 at 03:08 PM

## 2019-08-08 ENCOUNTER — Ambulatory Visit

## 2019-08-08 ENCOUNTER — Ambulatory Visit: Admitting: Internal Medicine

## 2019-08-08 NOTE — Progress Notes (Signed)
 General Medicine Visit      COLONOSCOPY, PHQ2 SCORE, PATPORTALPIN.      * [place]: OTHER    Patient Details and Vitals  Patient reviewed in/by:  Office    BMI: 28.54    BP: 124/ 85  Ht (inches): 68  Weight: 187  BMI: 28.54  Temp: 98.2  Resp Rate: 18  Pulse: 93  O2 Sat: 98        Patient Medical History           Patient Screening                       Data to be shared to Telemed/Office Visits        CHIEF COMPLAINT:  Swallowing issue, toe pain.    HISTORY:  The patient has had around a dozen episodes over the past 6-7 months where food gets stuck in his lower esophagus.  It happens most often if he is taking larger bites of food and chewing them completely.  He will feel initial discomfort in his lower esophagus and then he will continue to eat to see if he can get it to pass.  He will take a sip of water.  Sometimes he will get up from the table and walk around and it will resolve over the course of few minutes.  He has vomited twice related to this, the last time being within the past week.  He denies any daily epigastric or other abdominal pain.  He did have an upper endoscopy around 10 or 12 years ago due to some discomfort in his lower esophagus.  No findings then.    He has some pain in his left second and third toe.  He had an injury around 5-6 months ago where he hit his foot on the leg of his bed.  He saw orthopedic physician's assistant at [facility] around 3 months ago.  He wore a boot for a while and it did get somewhat better, but over the past few weeks, it has been worsening.  He does wear an insert in his shoes.    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  He is somewhat stressed by his long work hours.    PHYSICAL EXAMINATION:  Vital signs as above.  Conjunctivae are normal.  Sclerae are anicteric.  Oral mucosa is moist without lesions.  Neck is without adenopathy or masses.  Lungs are clear bilaterally.  Heart is in a regular rhythm.  Abdomen is soft, nontender.  No  organomegaly or masses are noted.  Extremities reveal no edema.    ASSESSMENT AND PLAN:  1.  Odynophagia -- we will set up an upper endoscopy to rule out an esophageal stricture or achalasia or other.  2.  Left foot pain -- he will set up a follow-up appointment with Orthopedics at [facility].  3.  Set up Dermatology for routine skin check.  4.  He recently started a Mediterranean diet to try to bring his weight down.  5.  Return to the office in 2021 for annual physical exam.      PATIENT NAME:  Brandon [name], Brandon [name]     MR #: [id number]  DATE: [id number]  DICATED BY: Wynonia Lawman M.D. 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)  UPPER BACK PAIN (ICD-724.5) (ICD10-M54.9)  MALAISE (ICD-780.7) (ICD10-Z87.898)  PLEURITIC CHEST PAIN (ICD-786.52) (ICD10-R07.89)  HEMATURIA, GROSS (ICD-599.7) (ICD10-R31.0)  RENAL MASS (ICD-593.9) (ICD10-N28.89)  ARTHRALGIA (ICD-719.48) (ICD10-M25.50)  CORONARY ARTERY DISEASE (ICD-414.00) (ICD10-I25.10)  HYPERTENSION (ICD-401.9) (ICD10-I10)  URINARY FREQUENCY (ICD-788.41) (ICD10-R35.0)  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-N20.0)  RENAL INSUFFICIENCY (ICD-593.9) (ICD10-N28.9)  HYPERLIPIDEMIA (ICD-272.4) (ICD10-E78.5)  HEMATURIA, MICROSCOPIC (ICD-599.72) (ICD10-R31.2)  ACROCHORDON (ICD-701.9) (ICD10-L91.8)  OBESITY (ICD-278.01) (ICD10-E66.01)  SNORING (ICD-786.09) (ICD10-R06.83)  IRRITABLE BOWEL SYNDROME (ICD-564.1) (SQS47-Z58.0)    Past Medical History  (changes today):  Added new problem of FOOD STICKS ON SWALLOWING (ICD-787.29) (ICD10-R13.10)  Added new problem of ODYNOPHAGIA (WBE-685.48) (ICD10-R13.19)  Added new problem of TOE PAIN (ICD-729.5) (SNG14-X59.733)         Medications (prior to today's visit):  LISINOPRIL 20 MG ORAL TABLET (LISINOPRIL) Take one tablet by mouth once daily  ROSUVASTATIN CALCIUM 40 MG TAB (ROSUVASTATIN CALCIUM) TAKE 1 TABLET BY MOUTH EVERY DAY  ASPIRIN 81 MG ORAL TABLET (ASPIRIN) Take one tablet by mouth once daily  TAMSULOSIN HCL 0.4 MG ORAL CAPSULE (TAMSULOSIN HCL) Take one capsule by mouth once daily; Route: ORAL  RA SAW PALMETTO 160 MG ORAL CAPSULE (SAW PALMETTO (SERENOA REPENS)) ; Route: ORAL  LORAZEPAM 0.5 MG ORAL TABLET (LORAZEPAM) one by mouth 30 min prior to MRI. May repeat x 1; Route: ORAL  TRIAMCINOLONE ACETONIDE 0.025 % EXTERNAL CREAM (TRIAMCINOLONE ACETONIDE) Apply to affected area bid; Route: EXTERNAL    No Changes to Medication List                    Vitals:   Ht: 68 in.  Wt: 187 lbs.  BMI (in-lb) 28.54  Temp: 98.2deg F.     BP (Initial [place] Screening): 124 / 85     BP (Rechecked, Actionable): 124 / 85 mmHg   Pulse Rate: 93 bpm Resp Rate: 18 bpm O2 Sat: 98 %                Orders (this visit):  EGD [CPT-43235]  [facility] [Ref-Derm]  RTC (Return to Clinic) [RTC-000]  Est Level 3 [CPT-99213]    Patient Care Plan      Immunization Worksheet 2019 (rev 10/19/2018)                             Follow-up With:           Created By Earnie Larsson on 08/08/2019 at 11:39 AM    Electronically Signed By Wynonia Lawman MD on 08/13/2019 at 02:06 PM

## 2019-08-09 ENCOUNTER — Ambulatory Visit

## 2019-08-09 NOTE — Progress Notes (Signed)
 EGD:    I have left two VMS trying to schedule is EGD within 3 weeks for the endo unit .......................................Brandon [name]  August 09, 2019 1:48 PM      DERM:    08/14/2019 11 AM  Dr. Gretta Arab   B-13    .......................................Brandon [name]  August 09, 2019 1:50 PM    [facility] for EDGD unt .......................................Brandon [name]  August 10, 2019 10:39 AM    Spoke to [facility] - transferring me to a scheduler to book. Renne assisted    booked for : 08/24/2019  10 - 10:30   .......................................Bernadene Person  August 10, 2019 12:59 PM  email sent with informaation .......................................Brandon [name]  August 10, 2019 1:25 PM    No repsonse from [name] .......................................Brandon [name]  August 16, 2019 10:19 AM                          Allergies not determined    Plan   Medication List:   LISINOPRIL 20 MG ORAL TABLET (LISINOPRIL) Take one tablet by mouth once daily  ROSUVASTATIN CALCIUM 40 MG TAB (ROSUVASTATIN CALCIUM) TAKE 1 TABLET BY MOUTH EVERY DAY  ASPIRIN 81 MG ORAL TABLET (ASPIRIN) Take one tablet by mouth once daily  TAMSULOSIN HCL 0.4 MG ORAL CAPSULE (TAMSULOSIN HCL) Take one capsule by mouth once daily; Route: ORAL  RA SAW PALMETTO 160 MG ORAL CAPSULE (SAW PALMETTO (SERENOA REPENS)) ; Route: ORAL  LORAZEPAM 0.5 MG ORAL TABLET (LORAZEPAM) one by mouth 30 min prior to MRI. May repeat x 1; Route: ORAL  TRIAMCINOLONE ACETONIDE 0.025 % EXTERNAL CREAM (TRIAMCINOLONE ACETONIDE) Apply to affected area bid; Route: EXTERNAL                  Created By Bernadene Person on 08/09/2019 at 01:47 PM    Electronically Signed By Bernadene Person on 08/16/2019 at 10:19 AM

## 2019-08-14 ENCOUNTER — Ambulatory Visit

## 2019-08-14 ENCOUNTER — Ambulatory Visit: Admitting: Dermatology

## 2019-08-14 ENCOUNTER — Ambulatory Visit (HOSPITAL_BASED_OUTPATIENT_CLINIC_OR_DEPARTMENT_OTHER): Admitting: Dermatology

## 2019-08-21 ENCOUNTER — Ambulatory Visit

## 2019-08-21 ENCOUNTER — Ambulatory Visit: Admitting: Internal Medicine

## 2019-08-21 LAB — HX MICRO-RESP VIRAL PANEL: HX COVID-19 (SARS-COV-2): NEGATIVE

## 2019-08-21 NOTE — Progress Notes (Signed)
 Nursing Visit.  Has had on and off runny nose.3-4 days ago developed sore throat that has since resolved.  Today wile eating lunch realized he has lost sence of smell.  Tested and advised to go home and quarantine .Will follow ......................................Marland KitchenSueanne Margarita, RN  August 21, 2019 1:16 PM  pending. ......................................Marland KitchenSueanne Margarita, RN  August 21, 2019 4:32 PM   pending. ......................................Marland KitchenSueanne Margarita, RN  August 21, 2019 6:30 PM  Negative.Emailed results to patient. ......................................Marland KitchenSueanne Margarita, RN  August 22, 2019 7:27 AM      Subjective/Objective          Patient Care Plan          Immunization Worksheet 2019 [phone] 10/19/2018)           Assessment and Plan  Assessment   New Problems:  COVID-19 EVALUATION ENCOUNTER, EXPOSURE RULED OUT (ICD10-Z03.818)    Allergies not determined    Plan   Medication List:   LISINOPRIL 20 MG ORAL TABLET (LISINOPRIL) Take one tablet by mouth once daily  ROSUVASTATIN CALCIUM 40 MG TAB (ROSUVASTATIN CALCIUM) TAKE 1 TABLET BY MOUTH EVERY DAY  ASPIRIN 81 MG ORAL TABLET (ASPIRIN) Take one tablet by mouth once daily  TAMSULOSIN HCL 0.4 MG ORAL CAPSULE (TAMSULOSIN HCL) Take one capsule by mouth once daily; Route: ORAL  RA SAW PALMETTO 160 MG ORAL CAPSULE (SAW PALMETTO (SERENOA REPENS)) ; Route: ORAL  LORAZEPAM 0.5 MG ORAL TABLET (LORAZEPAM) one by mouth 30 min prior to MRI. May repeat x 1; Route: ORAL  TRIAMCINOLONE ACETONIDE 0.025 % EXTERNAL CREAM (TRIAMCINOLONE ACETONIDE) Apply to affected area bid; Route: EXTERNAL            Orders     COVID-19 (SARS-CoV-2) RT-PCR([facility]) [JEH-63149]        Created By Sueanne Margarita RN on 08/21/2019 at 01:15 PM    Electronically Signed By Sueanne Margarita RN on 08/22/2019 at 08:18 AM

## 2019-08-24 ENCOUNTER — Ambulatory Visit

## 2019-08-24 ENCOUNTER — Ambulatory Visit: Admitting: Gastroenterology

## 2019-08-27 ENCOUNTER — Ambulatory Visit: Admitting: Internal Medicine

## 2019-08-29 LAB — HX SURGICAL

## 2019-08-29 LAB — HX COLONOSCOPY

## 2019-09-10 ENCOUNTER — Ambulatory Visit

## 2019-09-10 ENCOUNTER — Ambulatory Visit: Admitting: Internal Medicine

## 2019-09-10 LAB — HX HEM-ROUTINE
HX BASO #: 0 10*3/uL (ref 0.0–0.2)
HX BASO: 0 %
HX EOSIN #: 0.2 10*3/uL (ref 0.0–0.5)
HX EOSIN: 3 %
HX HCT: 45.8 % (ref 37.0–47.0)
HX HGB: 14.9 g/dL (ref 13.5–16.0)
HX IMMATURE GRANULOCYTE#: 0 10*3/uL (ref 0.0–0.1)
HX IMMATURE GRANULOCYTE: 0 %
HX LYMPH #: 2 10*3/uL (ref 1.0–4.0)
HX LYMPH: 28 %
HX MCH: 29.3 pg (ref 26.0–34.0)
HX MCHC: 32.5 g/dL (ref 32.0–36.0)
HX MCV: 90.2 fL (ref 80.0–98.0)
HX MONO #: 0.6 10*3/uL (ref 0.2–0.8)
HX MONO: 9 %
HX MPV: 10.8 fL (ref 9.1–11.7)
HX NEUT #: 4.3 10*3/uL (ref 1.5–7.5)
HX NRBC #: 0 10*3/uL
HX NUCLEATED RBC: 0 %
HX PLT: 198 10*3/uL (ref 150–400)
HX RBC BLOOD COUNT: 5.08 M/uL (ref 4.20–5.50)
HX RDW: 12.8 % (ref 11.5–14.5)
HX SEG NEUT: 60 %
HX WBC: 7.1 10*3/uL (ref 4.0–11.0)

## 2019-09-10 LAB — HX CHEM-PANELS
HX ANION GAP: 9 (ref 3–14)
HX BLOOD UREA NITROGEN: 25 mg/dL — ABNORMAL HIGH (ref 6–24)
HX CHLORIDE (CL): 107 meq/L (ref 98–110)
HX CO2: 24 meq/L (ref 20–30)
HX CREATININE (CR): 1.37 mg/dL — ABNORMAL HIGH (ref 0.57–1.30)
HX GFR, AFRICAN AMERICAN: 68 mL/min/{1.73_m2}
HX GFR, NON-AFRICAN AMERICAN: 59 mL/min/{1.73_m2} — ABNORMAL LOW
HX GLUCOSE: 89 mg/dL (ref 70–139)
HX POTASSIUM (K): 4.3 meq/L (ref 3.6–5.1)
HX SODIUM (NA): 140 meq/L (ref 135–145)

## 2019-09-10 LAB — HX CHEM-LFT
HX ALANINE AMINOTRANSFERASE (ALT/SGPT): 331 IU/L — ABNORMAL HIGH (ref 0–54)
HX ALKALINE PHOSPHATASE (ALK): 100 IU/L (ref 40–130)
HX ASPARTATE AMINOTRANFERASE (AST/SGOT): 50 IU/L — ABNORMAL HIGH (ref 10–42)
HX BILIRUBIN, TOTAL: 1.3 mg/dL — ABNORMAL HIGH (ref 0.2–1.1)

## 2019-09-10 LAB — HX BF-URINALYSIS
HX KETONES: NEGATIVE mg/dL
HX LEUKOCYTE ES: NEGATIVE
HX NITRITE LEVEL: NEGATIVE
HX SPECIFIC GRAVITY: 1.02
HX U BILIRUBIN: NEGATIVE
HX U BLOOD: NEGATIVE
HX U GLUCOSE: NEGATIVE mg/dL
HX U PH: 5
HX U PROTEIN: NEGATIVE mg/dL
HX U UROBILINIG: 0.2 EU

## 2019-09-10 LAB — HX DIABETES: HX GLUCOSE: 89 mg/dL (ref 70–139)

## 2019-09-10 NOTE — Progress Notes (Signed)
 General Medicine Visit      COLONOSCOPY, PHQ2 SCORE, PATPORTALPIN.      * [place]: OTHER    Patient Details and Vitals  Patient reviewed in/by:  Office    BMI: 28.41    BP: 145/ 86  Ht (inches): 68  with shoes  Weight: 186.2  BMI: 28.41  Temp: 97  Resp Rate: 18  Pulse: 83  O2 Sat: 98        Patient Medical History           Patient Screening                       Data to be shared to Telemed/Office Visits        CHIEF COMPLAINT:  Abdominal pain.    HISTORY:  The patient developed acute epigastric pain on 09/05/19 after eating a steak with hollandaise on it relatively quickly.  This lasted about 8-10 hours.  He states he "felt pretty miserable."  He called out of work the next day but felt better by the end of the day on 09/06/19.  He had another somewhat fatty meal that day.  He had a repeat episode of the same symptoms with a 6/10 pain intensity.  He vomited several times that day.  After vomiting, he felt better.  He did not eat much over the next 8 hours.  The following 2 days, he felt generally well.  He had some mild discomfort last night but otherwise had been well.    He did dipstick on his urine at home as it was rather dark looking and he found the dipstick positive for leukocytes and nitrites.  He denies any dysuria.  He does not have any significant lower abdominal pain or back pain.    He has been eating mostly dry toast, ate a few shrimp, some pork, ____ roll.  He has been on a keto diet recently, so his fats have been increased.    He has had similar episodes like this in the past, probably 5 or 6 of them.  He is not currently on a PPI.  Recent upper endoscopy for odynophagia revealed "mild erosive gastropathy" with stigmata of recent bleeding, erythematous duodenopathy.  Path reports were negative for H. pylori.    PHYSICAL EXAMINATION:  Vital signs as above.  Conjunctivae are normal.  Sclerae are anicteric.  Oral mucosa moist without lesions.  Lungs are clear bilaterally.  Heart is in a regular rhythm.   Abdomen is soft with some mild epigastric tenderness down to the umbilicus.  No organomegaly or masses are noted.  Extremities reveal no edema.    ASSESSMENT AND PLAN:  1.  Recent bout of severe epigastric pain - this occurred after a fatty meal and the patient is concerned that this is cholecystitis.  Abdominal MRI from last year revealed normal gallbladder with no evidence of gallstones.  I wonder if this is all gastric related.  I have asked him to start on omeprazole 20 mg once daily.  He is agreeable.  I will check labs today including CBC, LFTs, and alkaline phosphatase.  He is to keep some Mylanta at home if needed p.r.n.  I also gave him a prescription for Donnatal as his mother used to take this when she had similar symptoms.  I do not think he will be using it often.  2.  Dark urine - his urine dipstick today is negative.  I will send it off for analysis.  It is unclear why he tested positive for leukocytes and nitrites at home.  3.  Chronic renal insufficiency - he is status post partial colectomy for renal cell carcinoma.  We will check renal function today.  His dark urine at home a few days ago suggests he might have been dehydrated.  4.  Return to the office in the next few months for annual physical exam or sooner if his symptoms recur.      PATIENT NAME:  Brandon [name], Brandon [name]     MR #: [id number]  DATE: [id number]  DICATED BY: Wynonia Lawman M.D. 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)  CERVICAL RADICULOPATHY (ICD-723.4) (ICD10-M54.12)  PAIN, CHEST  (ICD-786.50) (ICD10-R07.9)  EXPOSURE TO OTHER HAZARDOUS METALS (ICD-V87.09) (ICD10-Z77.018)  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)  FATIGUE (ICD-780.79) (ICD10-R53.83)  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-N20.0)  UPPER BACK PAIN (ICD-724.5) (ICD10-M54.9)  MALAISE (ICD-780.7) (ICD10-Z87.898)  PLEURITIC CHEST PAIN (ICD-786.52) (ICD10-R07.89)  HEMATURIA, GROSS (ICD-599.7) (ICD10-R31.0)  RENAL MASS (ICD-593.9) (ICD10-N28.89)  ARTHRALGIA (ICD-719.48) (ICD10-M25.50)  CORONARY ARTERY DISEASE (ICD-414.00) (ICD10-I25.10)  HYPERTENSION (ICD-401.9) (ICD10-I10)  URINARY FREQUENCY (ICD-788.41) (ICD10-R35.0)  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-N20.0)  RENAL INSUFFICIENCY (ICD-593.9) (ICD10-N28.9)  HYPERLIPIDEMIA (ICD-272.4) (ICD10-E78.5)  HEMATURIA, MICROSCOPIC (ICD-599.72) (ICD10-R31.2)  ACROCHORDON (ICD-701.9) (ICD10-L91.8)  OBESITY (ICD-278.01) (ICD10-E66.01)  SNORING (ICD-786.09) (ICD10-R06.83)  IRRITABLE BOWEL SYNDROME (ICD-564.1) (MVE72-C94.7)    Past Medical History (changes today):  Added new problem of ABDOMINAL PAIN (ICD-789.00) (ICD10-R10.9)         Medications (prior to today's visit):  LISINOPRIL 20 MG ORAL TABLET (LISINOPRIL) Take one tablet by mouth once daily  ROSUVASTATIN CALCIUM 40 MG TAB (ROSUVASTATIN CALCIUM) TAKE 1 TABLET BY MOUTH EVERY DAY  ASPIRIN 81 MG ORAL TABLET (ASPIRIN) Take one tablet by mouth once daily  TAMSULOSIN HCL 0.4 MG ORAL CAPSULE (TAMSULOSIN HCL) Take one capsule by mouth once daily; Route: ORAL  RA SAW PALMETTO 160 MG ORAL CAPSULE (SAW PALMETTO (SERENOA REPENS)) ; Route: ORAL  LORAZEPAM 0.5 MG ORAL TABLET (LORAZEPAM) one by mouth 30 min prior to MRI. May repeat x 1; Route: ORAL  TRIAMCINOLONE ACETONIDE 0.025 % EXTERNAL CREAM (TRIAMCINOLONE ACETONIDE) Apply to affected area bid; Route: EXTERNAL    Medications (after today's visit):  LISINOPRIL 20 MG ORAL TABLET (LISINOPRIL) Take one tablet by mouth once daily  ROSUVASTATIN CALCIUM 40 MG TAB (ROSUVASTATIN CALCIUM) TAKE 1  TABLET BY MOUTH EVERY DAY  ASPIRIN 81 MG ORAL TABLET (ASPIRIN) Take one tablet by mouth once daily  TAMSULOSIN HCL 0.4 MG ORAL CAPSULE (TAMSULOSIN HCL) Take one capsule by mouth once daily; Route: ORAL  RA SAW PALMETTO 160 MG ORAL CAPSULE (SAW PALMETTO (SERENOA REPENS)) ; Route: ORAL  LORAZEPAM 0.5 MG ORAL TABLET (LORAZEPAM) one by mouth 30 min prior to MRI. May repeat x 1; Route: ORAL  TRIAMCINOLONE ACETONIDE 0.025 % EXTERNAL CREAM (TRIAMCINOLONE ACETONIDE) Apply to affected area bid; Route: EXTERNAL  DONNATAL 16.2 MG/5ML ORAL ELIXIR (PB-HYOSCY-ATROPINE-SCOPOLAMINE) 5-10 ML 2-3  times daiy as neede for abd pain; Route: ORAL  OMEPRAZOLE 20 MG ORAL CAPSULE DELAYED RELEASE (OMEPRAZOLE) Take one capsule by mouth once daily; Route: ORAL                        Vitals:   Ht: 68 in.  Wt: 186.2 lbs.  BMI (in-lb) 28.41  Temp: 97deg F.     BP (Initial [place] Screening): 145 / 86     BP (Rechecked, Actionable): 145 / 86 mmHg   Pulse Rate: 83 bpm Resp Rate: 18 bpm O2 Sat: 98 %              Orders (this visit):  CBC/DIFF (with Plt) [WBC] [CPT-85025]  LYTES [SODIUM] [CPT-82495]  Blood Urea Nitrogen [BUN] [CPT-84520]  Creatinine (CR) [CREATININE] [CPT-82565]  Glucose  [CPT-82947]  SGOT (AST/SGOT) [SGOT (AST)] [CPT-84450]  SGPT (ALT/SGPT) [SGPT (ALT)] [CPT-84460]  Alkaline Phosphatase (ALK) [ALK PHOS] [CPT-84075]  Bilirubin, Total [BILI TOTAL] [CPT-82247]  Urinalysis with Reflex to Culture [UA COLOR] [CPT-81001]  Est Level 4 [CPT-99214]  RTC (Return to Clinic) [RTC-000]    Patient Care Plan      Immunization Worksheet 2019 (rev 10/19/2018)                             Follow-up With:           Created By Earnie Larsson on 09/10/2019 at 11:36 AM    Electronically Signed By Wynonia Lawman MD on 09/14/2019 at 10:00 AM

## 2019-09-14 ENCOUNTER — Ambulatory Visit

## 2019-09-14 NOTE — Progress Notes (Signed)
 Division of General Medicine - Future Orders      Requested tests to be done: __(date)____________________________        Orders     U/S Abdomen Specific Area [CPT-76705]        Special orders:        Created By Wynonia Lawman MD on 09/14/2019 at 02:01 PM    Electronically Signed By Wynonia Lawman MD on 09/14/2019 at 02:03 PM

## 2019-09-17 ENCOUNTER — Ambulatory Visit: Admitting: Internal Medicine

## 2019-10-15 ENCOUNTER — Ambulatory Visit

## 2019-10-15 ENCOUNTER — Ambulatory Visit: Admitting: Internal Medicine

## 2019-10-15 LAB — HX CHEM-LFT
HX ALANINE AMINOTRANSFERASE (ALT/SGPT): 31 IU/L (ref 0–54)
HX ALKALINE PHOSPHATASE (ALK): 58 IU/L (ref 40–130)
HX ASPARTATE AMINOTRANFERASE (AST/SGOT): 24 IU/L (ref 10–42)
HX BILIRUBIN, TOTAL: 1.1 mg/dL (ref 0.2–1.1)

## 2019-10-15 LAB — HX CHEM-LIPIDS
HX CHOL-HDL RATIO: 5.6
HX CHOLESTEROL: 161 mg/dL (ref 110–199)
HX HIGH DENSITY LIPOPROTEIN CHOL (HDL): 29 mg/dL — ABNORMAL LOW (ref 35–75)
HX HOURS FAST: 8 h
HX LDL: 107 mg/dL (ref 0–129)
HX TRIGLYCERIDES: 126 mg/dL (ref 40–250)

## 2019-10-15 LAB — HX CHOLESTEROL
HX CHOLESTEROL: 161 mg/dL (ref 110–199)
HX HIGH DENSITY LIPOPROTEIN CHOL (HDL): 29 mg/dL — ABNORMAL LOW (ref 35–75)
HX LDL: 107 mg/dL (ref 0–129)
HX TRIGLYCERIDES: 126 mg/dL (ref 40–250)

## 2019-10-15 NOTE — Progress Notes (Signed)
 General Medicine Visit      COLONOSCOPY, LDL, PHQ2 SCORE, PATPORTALPIN.      * [place]: OTHER    Patient Details and Vitals  Patient reviewed in/by:  Office      BP: 135/ 82  with shoes  Weight: 187.8  Temp: 97.3  Pulse: 90  O2 Sat: 95        Patient Medical History           Patient Screening                       Data to be shared to Telemed/Office Visits        CHIEF COMPLAINT:  Annual physical.    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  He is feeling generally well today although he still has some mild epigastric discomfort and occasional mild nausea.  I had asked him to start omeprazole once daily after his last visit with me last month but he did not start the medication until a few days ago.  He is not sure if it has helped thus far.    I plan on repeating his liver enzymes today as they were elevated last month after a GI illness.  He has not been drinking any alcohol.  He has been cutting back on fats in his diet.    He is in generally good spirits today.  He had both of his COVID-19 vaccinations through the hospital.  He received Pfizer vaccines on 08/27/19 and 09/17/19.  He had some mild discomfort at the site and some "brain fog."  He was out of work for 1 day only.    PAST MEDICAL HISTORY:  Renal cell carcinoma, coronary artery disease, status post stent in the LAD, hypertension, and urinary obstructive symptoms.    PAST SURGICAL HISTORY:  Partial left nephrectomy 09/27/18.  He is followed at [facility].    MEDICATIONS:  Lisinopril, rosuvastatin, baby aspirin, omeprazole, saw palmetto.    ALLERGIES:  NKDA.    SOCIAL HISTORY:  He denies tobacco or alcohol use.  He denies marijuana use.    FAMILY HISTORY:  He is married with a healthy son aged 34 years old and healthy daughter aged 1.  His mother is alive at age 30 and generally healthy.  His father is alive at age 57 with a history of heart disease, status post MI and CABG.  He has type 2 diabetes as well.    REVIEW OF SYSTEMS:  Tdap given  06/10/17.  He is up-to-date on seasonal flu vaccine.  He has not yet had Shingrix.  He had both COVID-19 vaccinations completed on 09/17/19.  He saw Ophthalmology on 05/21/19 and all is stable.  He has not yet had colonoscopy.  He denies chest pain, shortness of breath, palpitations, cough, fevers, chills or sweats.  He does have some mild epigastric discomfort and occasional nausea.  His bowels are moving normally.  He passes urine normally.  He is not currently using tamsulosin.  No specific joint aches or pains.  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    PHYSICAL EXAMINATION:  Vital signs as above.  HEENT:  Pupils are equal, round, and reactive to light.  Extraocular movements are intact.  Conjunctivae are normal.  Sclerae are anicteric.  Oral mucosa moist without lesions.  Teeth in excellent repair.  TMs are normal bilaterally.  Neck is without adenopathy, thyromegaly or bruits.  Lungs are clear  to auscultation bilaterally.  Heart is in a regular rhythm without murmurs, rubs or gallops.  Abdomen is soft and nontender.  No organomegaly or masses are noted.  Genital exam reveals normal, circumcised phallus with 2 normally descended testes.  No hernias are noted.  Rectal exam reveals no external lesions.  Sphincter tone is normal.  The prostate feels normal in size and texture.  There are no rectal masses.  Extremities reveal 2+ pedal pulses.  There is no edema or cyanosis.  There is no axillary or inguinal adenopathy.  Neurologic exam is grossly intact.  Skin exam reveals numerous moles scattered about his back and chest, nothing worrisome.  He is seen by Dermatology once yearly.  Of note, he continues to snore loudly.  His wife needs to sleep in another room.  He has symptoms, which are compatible with sleep apnea.    ASSESSMENT AND PLAN:  1.  Coronary artery disease - asymptomatic on appropriate medication including rosuvastatin and baby aspirin.  He has a stent in the LAD and is not  currently following with Cardiology.  Check lipids and LFTs on rosuvastatin.  2.  Chronic epigastric discomfort - this continues on mildly.  He started on omeprazole 4-5 days ago.  I have asked him to stay on it for at least 4-6 weeks to see how he does.  3.  Sleep issues - he has an Epworth scale total of 11.  He reports excessive daytime sleepiness, disturbed and restless sleep, decreased concentration, morning headaches, irregular breathing with pauses during sleep, disruptive snoring, nonrestorative sleep, irritability, memory loss, nocturia, hypertension, decreased libido, and inability to fall remain asleep.  I will set him up for a sleep study.  4.  Health maintenance - he will have his colonoscopy done at [facility] on 10/24/19.  He will look for Shingrix at the local pharmacy.  All other routine immunizations including COVID-19 are up-to-date.  5.  Transaminitis - recheck LFTs today.  6.  Return to the office in 6 months for routine checkup.      PATIENT NAME:  Brandon [name], Brandon [name]     MR #: [id number]  DATE: [id number]  DICATED BY: Wynonia Lawman M.D. SIGNED BY:   [id number] DT: [id number]   [id number]    job #[id number]    Patient Prep Updates                                         Past Medical History (prior to today's visit):  ELEVATION OF LEVELS OF LIVER TRANSAMINASE LEVELS (ICD10-R74.01)  ABDOMINAL PAIN (ICD-789.00) (ICD10-R10.9)  COVID-19 EVALUATION ENCOUNTER, EXPOSURE RULED OUT (ICD10-Z03.818)  TOE PAIN (ICD-729.5) (AYT01-S01.093)  ODYNOPHAGIA (ICD-787.29) (ICD10-R13.19)  FOOD STICKS ON SWALLOWING (ICD-787.29) (ICD10-R13.10)  CHEST PAIN (ICD-786.50) (ICD10-R07.9)  MYALGIA (ICD-729.1) (ICD10-M79.10)  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)  CERVICAL RADICULOPATHY (ICD-723.4) (ICD10-M54.12)  PAIN, CHEST (ICD-786.50) (ICD10-R07.9)  EXPOSURE TO OTHER HAZARDOUS METALS (ICD-V87.09) (ICD10-Z77.018)  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)  FATIGUE (ICD-780.79) (ICD10-R53.83)  NEPHROLITHIASIS  (ICD-592.0) (ICD10-N20.0)  UPPER BACK PAIN (ICD-724.5) (ICD10-M54.9)  MALAISE (ICD-780.7) (ICD10-Z87.898)  PLEURITIC CHEST PAIN (ICD-786.52) (ICD10-R07.89)  HEMATURIA, GROSS (ICD-599.7) (ICD10-R31.0)  RENAL MASS (ICD-593.9) (ICD10-N28.89)  ARTHRALGIA (ICD-719.48) (ICD10-M25.50)  CORONARY ARTERY DISEASE (ICD-414.00) (ICD10-I25.10)  HYPERTENSION (ICD-401.9) (ICD10-I10)  URINARY FREQUENCY (ICD-788.41) (ICD10-R35.0)  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-N20.0)  RENAL INSUFFICIENCY (ICD-593.9) (ICD10-N28.9)  HYPERLIPIDEMIA (ICD-272.4) (ICD10-E78.5)  HEMATURIA, MICROSCOPIC (ICD-599.72) (ICD10-R31.2)  ACROCHORDON (ICD-701.9) (ICD10-L91.8)  OBESITY (ICD-278.01) (ICD10-E66.01)  SNORING (ICD-786.09) (ICD10-R06.83)  IRRITABLE BOWEL SYNDROME (ICD-564.1) (HYQ65-H84.6)    Past Medical History (changes today):  Added new problem of ANNUAL EXAM (ICD-V72.31) (ICD10-Z00.00)         Medications (prior to today's visit):  LISINOPRIL 20 MG ORAL TABLET (LISINOPRIL) Take one tablet by mouth once daily  ROSUVASTATIN CALCIUM 40 MG TAB (ROSUVASTATIN CALCIUM) TAKE 1 TABLET BY MOUTH EVERY DAY  ASPIRIN 81 MG ORAL TABLET (ASPIRIN) Take one tablet by mouth once daily  TAMSULOSIN HCL 0.4 MG ORAL CAPSULE (TAMSULOSIN HCL) Take one capsule by mouth once daily; Route: ORAL  RA SAW PALMETTO 160 MG ORAL CAPSULE (SAW PALMETTO (SERENOA REPENS)) ; Route: ORAL  LORAZEPAM 0.5 MG ORAL TABLET (LORAZEPAM) one by mouth 30 min prior to MRI. May repeat x 1; Route: ORAL  TRIAMCINOLONE ACETONIDE 0.025 % EXTERNAL CREAM (TRIAMCINOLONE ACETONIDE) Apply to affected area bid; Route: EXTERNAL  DONNATAL 16.2 MG/5ML ORAL ELIXIR (PB-HYOSCY-ATROPINE-SCOPOLAMINE) 5-10 ML 2-3 times daiy as neede for abd pain; Route: ORAL  OMEPRAZOLE 20 MG ORAL CAPSULE DELAYED RELEASE (OMEPRAZOLE) Take one capsule by mouth once daily; Route: ORAL    No Changes to Medication List                    Vitals:   Ht: 68 in.  Wt: 187.8 lbs.  Temp: 97.3deg F.     BP (Initial [place] Screening): 135 / 82     BP  (Rechecked, Actionable): 135 / 82 mmHg   Pulse Rate: 90 bpm O2 Sat: 95 %              Orders (this visit):  Est Preventive 40-64yo [CPT-99396]  RTC in 6 months [RTC-180]  Lipid Profile-Chol, HDL, Trig, LDL(calc) [CHOLESTEROL] [CPT-80061]  SGOT (AST/SGOT) [SGOT (AST)] [CPT-84450]  SGPT (ALT/SGPT) [SGPT (ALT)] [CPT-84460]  Alkaline Phosphatase (ALK) [ALK PHOS] [CPT-84075]  Bilirubin, Total [BILI TOTAL] [CPT-82247]  PSA [PSA] [CPT-84152]    Patient Care Plan      Immunization Worksheet 2019 (rev 10/19/2018)                             Follow-up With:           Created By Bernadene Person on 10/15/2019 at 10:18 AM    Electronically Signed By Wynonia Lawman MD on 10/19/2019 at 09:56 AM

## 2019-10-15 NOTE — Progress Notes (Signed)
 [facility] October 15, 2019  [address]   [place]  [place] [address]  Main: [phone]  Fax: [phone]  Patient Portal: [email or web address]            [facility]   Phone: [phone] . Fax: [phone]    Patient Name: Brandon BABICH, Brandon [name]  DOB: 1965-12-20 MR#: [id number]  Primary Care Physician: [name], Brandon [name]  Phone #: [phone]  Primary Language: English  Insurance: [id number]  PPO   ID#: [id number]  Pt Address: [address]  [place], [place]  [address]  Home Phone: [phone]   Work Phone: [phone]  **********************************************************************************************************************************************  NOTE: Recent supporting office notes will be required with this request.  **********************************************************************************************************************************************  Ht: 68 in     Wt: 187.8 lbs     BMI: 28.41     Epworth Scale Total (required, see below): ______  Epsworth Sleepiness Scale (score each of following 8 questions from 0 to 3, record total above):    0 = Would never doze/sleep; 1 = Slight chance of doze/sleep; 2 = Mod chance of doze/sleep; 3 = High chance of doze/sleep  ___ Sitting and reading        ___ Watching TV ___ Sitting inactive in public place ___ Lying down in afternoon       ___ Sitting quiet after lunch   ___ Sitting and talking to someone        ___ Stopped for few minutes in traffic while driving ___ As passenger in motor vehicle for > 1 hr    Special Needs? _______________________________________________________  Primary Suspected Diagnosis: ____________________________________________  Has pt had previous sleep testing? ___ Yes ___ No (If yes, please submit Sleep Reports if available)  Schedule follow-up with Sleep Specialist? ___ Yes ___  No  **********************************************************************************************************************************************  Clinical Info (Select at least 3 for Adult only, to qualify for Home Study Testing):   ___ Excessive daytime sleepiness ___ Disruptive snoring  ___ Disturbed or restless sleep ___ Non-restorative sleep  ___ Hypertension  ___ Witnessed Apnea  ___ Irritability   ___ Hallucinations  ___ Decrease concentration ___ Memory loss   ___ Wake up gasping/choking  ___ Morning headaches  ___ Night Terrors  ___ Sleep walking/talking  ___ Bruxism   ___ Leg/Arm jerks  ___ Decrease libido  ___ Injury during sleep  ___ Nocturia   ___ Inability to fall/remain asleep  ___ Irreg. breathing/pauses during sleep ___ Enlarged Tonsils/abnormalities compromising respiration  ___ Other: ________________________    Medical Hx (Check all that apply): ___ Stroke (date__________) ___ Transient Ischemic Attack  ___ Coronary Artery Disease ___ Pulmonary Hypertension ___ Mod/Sev COPD, Lung disease  ___ Mod/Sev CHF  ___ Suspected Nocturnal Seizures ___ Suspected Narcolepsy  ___ Polycythemia  ___ Documented Obesity Hypoventilation Syndrome  ___ Arrhythmias: ___________ ___ H/o Central OR Mixed Sleep Apnea (must be in prior sleep report)  ___ Neurodegenerative Disorders / Cognitive Impairment preventing HST  ___ Sleep behaviors not recalled by pt, but suspicious of REM behavior disorder  ___ Neuromuscular Weakness affecting respiratory function or impairing activities (specify): ___________     Other (Please check all that apply): ___ Oxygen dependent for any reason ___ Recent T/A, UPP ___ Change in BMI > 5  ___ Neuromuscular Impairment; needs assistance with ADLs  ___ New Symptoms/Other: ______________    Physician signature: ______________________________________________ Date: 10/15/2019    Coordinator Checklist: Initial to notate that each area is complete  ___ Epsworth Sleepiness  ___ Clinical Info (must  select 3  for home study)  ___ Medical History (Office Notes)    Problems:  ELEVATION OF LEVELS OF LIVER TRANSAMINASE LEVELS (ICD10-R74.01)  ABDOMINAL PAIN (ICD-789.00) (ICD10-R10.9)  COVID-19 EVALUATION ENCOUNTER, EXPOSURE RULED OUT (ICD10-Z03.818)  TOE PAIN (ICD-729.5) (ICD10-M79.676)  ODYNOPHAGIA (ICD-787.29) (ICD10-R13.19)  FOOD STICKS ON SWALLOWING (ICD-787.29) (ICD10-R13.10)  CHEST PAIN (ICD-786.50) (ICD10-R07.9)  MYALGIA (ICD-729.1) (ICD10-M79.10)  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)  CERVICAL RADICULOPATHY (ICD-723.4) (ICD10-M54.12)  PAIN, CHEST (ICD-786.50) (ICD10-R07.9)  EXPOSURE TO OTHER HAZARDOUS METALS (ICD-V87.09) (ICD10-Z77.018)  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)  FATIGUE (ICD-780.79) (ICD10-R53.83)  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-N20.0)  UPPER BACK PAIN (ICD-724.5) (ICD10-M54.9)  MALAISE (ICD-780.7) (ICD10-Z87.898)  PLEURITIC CHEST PAIN (ICD-786.52) (ICD10-R07.89)  HEMATURIA, GROSS (ICD-599.7) (ICD10-R31.0)  RENAL MASS (ICD-593.9) (ICD10-N28.89)  ARTHRALGIA (ICD-719.48) (ICD10-M25.50)  CORONARY ARTERY DISEASE (ICD-414.00) (ICD10-I25.10)  HYPERTENSION (ICD-401.9) (ICD10-I10)  URINARY FREQUENCY (ICD-788.41) (ICD10-R35.0)  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-N20.0)  RENAL INSUFFICIENCY (ICD-593.9) (ICD10-N28.9)  HYPERLIPIDEMIA (ICD-272.4) (ICD10-E78.5)  HEMATURIA, MICROSCOPIC (ICD-599.72) (ICD10-R31.2)  ACROCHORDON (ICD-701.9) (ICD10-L91.8)  OBESITY (ICD-278.01) (ICD10-E66.01)  SNORING (ICD-786.09) (ICD10-R06.83)  IRRITABLE BOWEL SYNDROME (ICD-564.1) (LLV74-X18.5)      Medications:   LISINOPRIL 20 MG ORAL TABLET Take one tablet by mouth once daily, ROSUVASTATIN CALCIUM 40 MG TAB TAKE 1 TABLET BY MOUTH EVERY DAY, ASPIRIN 81 MG ORAL TABLET Take one tablet by mouth once daily, TAMSULOSIN HCL 0.4 MG ORAL CAPSULE Take one capsule by mouth once daily; Route: ORAL, RA SAW PALMETTO 160 MG ORAL CAPSULE; Route: ORAL, LORAZEPAM 0.5 MG ORAL TABLET one by mouth 30 min prior to MRI.  May repeat x 1; Route: ORAL, TRIAMCINOLONE ACETONIDE 0.025 % EXTERNAL CREAM Apply to affected area bid; Route: EXTERNAL, DONNATAL 16.2 MG/5ML ORAL ELIXIR 5-10 ML 2-3 times daiy as neede for abd pain; Route: ORAL, OMEPRAZOLE 20 MG ORAL CAPSULE DELAYED RELEASE Take one capsule by mouth once daily; Route: ORAL.      Allergies:   NKA        Created By Wynonia Lawman Brandon [name] on 10/15/2019 at 10:50 AM    Electronically Signed By Wynonia Lawman Brandon [name] on 10/15/2019 at 10:50 AM

## 2019-10-23 ENCOUNTER — Ambulatory Visit

## 2019-10-23 ENCOUNTER — Ambulatory Visit: Admitting: Infectious Disease

## 2019-10-23 ENCOUNTER — Ambulatory Visit: Admitting: Internal Medicine

## 2019-10-23 LAB — HX CHEM-LFT
HX ALANINE AMINOTRANSFERASE (ALT/SGPT): 45 IU/L (ref 0–54)
HX ALKALINE PHOSPHATASE (ALK): 63 IU/L (ref 40–130)
HX ASPARTATE AMINOTRANFERASE (AST/SGOT): 32 IU/L (ref 10–42)
HX BILIRUBIN, TOTAL: 1.2 mg/dL — ABNORMAL HIGH (ref 0.2–1.1)
HX LACTATE DEHYDROGENASE (LDH): 402 IU/L — ABNORMAL HIGH (ref 120–220)

## 2019-10-23 LAB — HX MICRO-RESP VIRAL PANEL: HX COVID-19 (SARS-COV-2) ADMIT SCREEN: NEGATIVE

## 2019-10-23 LAB — HX CHEM-OTHER
HX AMYLASE: 75 IU/L (ref 25–125)
HX LIPASE: 33 IU/L (ref 8–60)

## 2019-10-23 NOTE — Telephone Encounter (Signed)
 Call Details:   Another bout of mild epigastric pain and feeling crappy after eating a fatty lunch.  Mild pain kicked in 6 hours later.  Should we check amylase and lipase?  R  ??     Sent from my iPhone    RESPONSE/ORDERS:    Pt had labs drawn yest. Amy/lip normal as are LFT's.  Tbili mildly elevated at 1.2.  I have spoken w. GI and they will reach out to pt to set up appt w. Dr. Barbera Setters in next 1-2 weeks. Pt made aware.......................................Marland KitchenSherrilee Gilles, MD  October 24, 2019 10:08 AM               ORDERS/PROBS/MEDS/ALL     Problems:   ANNUAL EXAM (ICD-V72.31) (ICD10-Z00.00)  ELEVATION OF LEVELS OF LIVER TRANSAMINASE LEVELS (ICD10-R74.01)  ABDOMINAL PAIN (ICD-789.00) (ICD10-R10.9)  COVID-19 EVALUATION ENCOUNTER, EXPOSURE RULED OUT (ICD10-Z03.818)  TOE PAIN (ICD-729.5) (ICD10-M79.676)  ODYNOPHAGIA (ICD-787.29) (ICD10-R13.19)  FOOD STICKS ON SWALLOWING (ICD-787.29) (ICD10-R13.10)  CHEST PAIN (ICD-786.50) (ICD10-R07.9)  MYALGIA (ICD-729.1) (ICD10-M79.10)  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)  CERVICAL RADICULOPATHY (ICD-723.4) (ICD10-M54.12)  PAIN, CHEST (ICD-786.50) (ICD10-R07.9)  EXPOSURE TO OTHER HAZARDOUS METALS (ICD-V87.09) (ICD10-Z77.018)  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)  FATIGUE (ICD-780.79) (ICD10-R53.83)  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-N20.0)  UPPER BACK PAIN (ICD-724.5) (ICD10-M54.9)  MALAISE (ICD-780.7) (ICD10-Z87.898)  PLEURITIC CHEST PAIN (ICD-786.52) (ICD10-R07.89)  HEMATURIA, GROSS (ICD-599.7) (ICD10-R31.0)  RENAL MASS (ICD-593.9) (ICD10-N28.89)  ARTHRALGIA (ICD-719.48) (ICD10-M25.50)  CORONARY ARTERY DISEASE (ICD-414.00) (ICD10-I25.10)  HYPERTENSION (ICD-401.9) (ICD10-I10)  URINARY FREQUENCY (ICD-788.41) (ICD10-R35.0)  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-N20.0)  RENAL INSUFFICIENCY (ICD-593.9) (ICD10-N28.9)  HYPERLIPIDEMIA (ICD-272.4) (ICD10-E78.5)  HEMATURIA, MICROSCOPIC (ICD-599.72) (ICD10-R31.2)  ACROCHORDON (ICD-701.9)  (ICD10-L91.8)  OBESITY (ICD-278.01) (ICD10-E66.01)  SNORING (ICD-786.09) (ICD10-R06.83)  IRRITABLE BOWEL SYNDROME (ICD-564.1) (ICD10-K58.9)    Meds (prior to this call):   LISINOPRIL 20 MG ORAL TABLET (LISINOPRIL) Take one tablet by mouth once daily  ROSUVASTATIN CALCIUM 40 MG TAB (ROSUVASTATIN CALCIUM) TAKE 1 TABLET BY MOUTH EVERY DAY  ASPIRIN 81 MG ORAL TABLET (ASPIRIN) Take one tablet by mouth once daily  TAMSULOSIN HCL 0.4 MG ORAL CAPSULE (TAMSULOSIN HCL) Take one capsule by mouth once daily; Route: ORAL  RA SAW PALMETTO 160 MG ORAL CAPSULE (SAW PALMETTO (SERENOA REPENS)) ; Route: ORAL  LORAZEPAM 0.5 MG ORAL TABLET (LORAZEPAM) one by mouth 30 min prior to MRI. May repeat x 1; Route: ORAL  TRIAMCINOLONE ACETONIDE 0.025 % EXTERNAL CREAM (TRIAMCINOLONE ACETONIDE) Apply to affected area bid; Route: EXTERNAL  DONNATAL 16.2 MG/5ML ORAL ELIXIR (PB-HYOSCY-ATROPINE-SCOPOLAMINE) 5-10 ML 2-3 times daiy as neede for abd pain; Route: ORAL  OMEPRAZOLE 20 MG ORAL CAPSULE DELAYED RELEASE (OMEPRAZOLE) Take one capsule by mouth once daily; Route: ORAL            Created By Wynonia Lawman MD on 10/23/2019 at 09:27 AM    Electronically Signed By Wynonia Lawman MD on 10/24/2019 at 10:08 AM

## 2019-10-23 NOTE — Progress Notes (Signed)
 Division of General Medicine - Future Orders      Requested tests to be done: __(date)____________________________        Orders     Amylase  [CPT-82150]  Lipase [LIPASE SERUM] [CPT-83690]  SGOT (AST/SGOT) [SGOT (AST)] [CPT-84450]  SGPT (ALT/SGPT) [SGPT (ALT)] [CPT-84460]  Alkaline Phosphatase (ALK) [ALK PHOS] [CPT-84075]  Bilirubin, Total [BILI TOTAL] [CPT-82247]  Lactate Dehydrogenase (LD) [CPT-83615]        Special orders:        Created By Wynonia Lawman MD on 10/23/2019 at 09:50 AM    Electronically Signed By Wynonia Lawman MD on 10/23/2019 at 09:53 AM

## 2019-10-30 ENCOUNTER — Ambulatory Visit: Admitting: Hospitalist

## 2019-11-09 ENCOUNTER — Ambulatory Visit: Admitting: Hand Surgery

## 2019-11-09 ENCOUNTER — Ambulatory Visit

## 2019-11-09 NOTE — Progress Notes (Signed)
 .  Progress Notes  .  Patient: Brandon [name], Brandon [name]  Provider: Yetta Numbers    .  DOB: August 30, 1966 Age: 54 Y Sex: Male  .  PCP: [name] MD  Date: 11/09/2019  .  --------------------------------------------------------------------------------  .  REASON FOR APPOINTMENT  .  1. R WRIST SPRAINED  .  HISTORY OF PRESENT ILLNESS  .  GENERAL:   He presnets with pain at the base of right thumb. he was  scratching his back with his right hand 2-3 weeks ago, and felt a  "pop." He has had pain at the base of his thumb, aggravated by  pinch or grip.  Marland Kitchen  CURRENT MEDICATIONS  .  Taking Amlodipine Besylate 5 MG Tablet 1 tablet Orally Once a day  Taking Aspirin 81 81 MG Tablet Delayed Release as directed Orally  Taking Crestor 40 MG Tablet 1 tablet Orally Once a day  Taking Lisinopril 40 mg Tablet 1 tablet Orally Once a day  Taking Urocit-K 15 15 MEQ (1620 MG) Tablet Extended Release 2  tablet with meals Orally Twice a day  Medication List reviewed and reconciled with the patient  .  PAST MEDICAL HISTORY  .  Hypertension  Hypercholesterolemia  Recurrent kidney stones  .  ALLERGIES  .  Penicillin: anaphylaxis - Allergy  .  SURGICAL HISTORY  .  Closed manipulation and pinning, right middle mallet finger.  06/15/2018  Ureteroscopy with Lithotripsy, Dr. 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  FAMILY HISTORY  .  Mother: alive, hypertension, diagnosed with ADHD,  hyperactive-impulsive type  Father: cabg; cad, ADHD, hyperactive-impulsive type  Siblings: sister healthy  .  SOCIAL HISTORY  .  .  Tobacco  history: Never smoked.  .  Work/Occupation: employed full-time.  .  Alcohol  Socially.  .  Illicit drugs: Denies.  .  Patient is married. Two children. Works as a Marine scientist.  .  HOSPITALIZATION/MAJOR DIAGNOSTIC PROCEDURE  .  No Hospitalization History.  Marland Kitchen  REVIEW OF  SYSTEMS  .  ORT:  .  Eyes    No . Ear, Nose Throat    No . Digestion, Stomach, Bowel     No . Bladder Problems    No . Bleeding Problems    No .  Numbness/Tingling    No . Anxiety/Depression    No .  Fever/Chills/Fatigue    No . Chest Pain/Tightness/Palpitations     No . Skin Rash    No . Dental Problems    No . Joint/Muscle  Pain/Cramps    Yes . Blackout/Fainting    No . Other    No .  .  VITAL SIGNS  .  Pain scale 1, Ht-in 68, Ht-cm 172.72.  Marland Kitchen  PHYSICAL EXAMINATION  .  he appears healthy. exam of right hand demonstrates no swelling.  tender at [facility] joint with pain on translation. no instability.  full motion sensation intact to light touch. fingers pink and  warm with good cp refill. Xrays of right wrist, taken previousl,  reviewed by me demnstrate early 1st [facility] OA.  .  ASSESSMENTS  .  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  TREATMENT  .  Arthrosis of first carpometacarpal joint  Notes: nature of condition and treatment options reviewed with  him, including splint and injection. He would like to proceed  with splint and was given Rx for hand based [facility] splint.  .  FOLLOW UP  .  4 Weeks  .  Electronically signed by Yetta Numbers , MD on  11/11/2019 at 07:07 AM EST  .  Document electronically signed by Yetta Numbers    .

## 2019-11-09 NOTE — Progress Notes (Signed)
 * * *    [name], [name] MD **DOB:** [birth year] (54 yo M) **Acc No.** [id number] **DOS:**  11/09/2019    ---       Brandon [name]**    ------    34 Y old Male, DOB: 09/22/1965, External MRN: [id number]    Account Number: [id number]    [address] [name], [place]    Home: [phone]    Guarantor: Brandon [name] Insurance: [employer] PPO    PCP: Brandon Gilles, MD Referring: Brandon Gilles, MD    Appointment Facility: [facility]        * * *    11/09/2019 Progress Notes: Brandon Numbers, MD **CHN#:** [id number]    ------    ---       **Reason for Appointment**    ---      1\. R WRIST SPRAINED    ---      **History of Present Illness**    ---     _GENERAL_ :    He presnets with pain at the base of right thumb. he was scratching his back  with his right hand 2-3 weeks ago, and felt a "pop." He has had pain at the  base of his thumb, aggravated by pinch or grip.      **Current Medications**    ---    Taking    * Amlodipine Besylate 5 MG Tablet 1 tablet Orally Once a day    ---    * Aspirin 81 81 MG Tablet Delayed Release as directed Orally     ---    * Crestor 40 MG Tablet 1 tablet Orally Once a day    ---    * Lisinopril 40 mg Tablet 1 tablet Orally Once a day    ---    * Urocit-K 15 15 MEQ (1620 MG) Tablet Extended Release 2 tablet with meals Orally Twice a day    ---    * Medication List reviewed and reconciled with the patient    ---      **Past Medical History**    ---      Hypertension.        ---    Hypercholesterolemia.        ---    Recurrent kidney stones.        ---      **Surgical History**    ---      Closed manipulation and pinning, right middle mallet finger. 06/15/2018    ---    Ureteroscopy with Lithotripsy, Dr. 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    Work/Occupation: employed full-time.    Alcohol Socially.    Illicit drugs: Denies.  Patient is married. Two children. Works as a  Marine scientist.    ---      **Allergies**    ---  Penicillin: anaphylaxis - Allergy    ---      **Hospitalization/Major Diagnostic Procedure**    ---      No Hospitalization History.    ---      **Review of Systems**    ---     _ORT_ :    Eyes No. Ear, Nose Throat No. Digestion, Stomach, Bowel No. Bladder Problems  No. Bleeding Problems No. Numbness/Tingling No. Anxiety/Depression No.  Fever/Chills/Fatigue No. Chest Pain/Tightness/Palpitations No. Skin Rash No.  Dental Problems No. Joint/Muscle Pain/Cramps Yes. Blackout/Fainting No. Other  No.         **Vital Signs**    ---    Pain scale 1, Ht-in 68, Ht-cm 172.72.      **Physical Examination**    ---    he appears healthy. exam of right hand demonstrates no swelling. tender at 1st  [facility] joint with pain on translation. no instability. full motion sensation  intact to light touch. fingers pink and warm with good cp refill. Xrays of  right wrist, taken previousl, reviewed by me demnstrate early 1st [facility] OA.      **Assessments**    ---    1\. 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 Arthrosis of first carpometacarpal joint**    Notes: nature of condition and treatment options reviewed with him, including  splint and injection. He would like to proceed with splint and was given Rx  for hand based [facility]MC splint.    ---     **Follow Up**    ---    4 Weeks    Electronically signed by Brandon [name] , MD on 11/11/2019 at 07:07 AM EST    Sign off status: Completed        * * *        [facility]    [address]    [place], [address]    [place], [place] [address]    Tel: [phone]    Fax: [phone]              * * *          Progress Note: Brandon Numbers, MD 11/09/2019    ---    Note generated by eClinicalWorks EMR/PM Software ([email or web address])

## 2019-11-22 ENCOUNTER — Ambulatory Visit

## 2019-12-03 ENCOUNTER — Ambulatory Visit: Admitting: Gastroenterology

## 2019-12-03 ENCOUNTER — Ambulatory Visit: Admitting: Internal Medicine

## 2019-12-03 ENCOUNTER — Ambulatory Visit

## 2019-12-03 NOTE — Progress Notes (Signed)
 .  Progress Notes  .  Patient: Brandon [name], Brandon [name]  Provider: Sandrea Matte  DOB: 09-18-1965 Age: 54 Y Sex: Male  Supervising Provider:: Esau Grew, MD  Date: 12/03/2019  .  PCP: [name] MD  Date: 12/03/2019  .  --------------------------------------------------------------------------------  .  REASON FOR APPOINTMENT  .  1. NP PER DR SPECTOR IN [facility]  .  HISTORY OF PRESENT ILLNESS  .  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  [name] in follow-up after recent EGD and for recurrent  epigastric abdominal pain.EGD done 08/24/2019 by Dr. Jennye Moccasin for  further evaluation of dysphagia. He had reported occasional solid  food getting stuck in chest every few months over last few years.  No odynophagia. EGD showed a mildly irregular Z-line at 42cm, but  otherwise normal esophageal mucosa (distal biopsies- consistent  with chronic reflux esophagitis, mid biopsies with just 2  intra-epithelial eosinophils per HPF, proximal-normal), 2 small  gastric erosions (biopsies- negative for HP), and patchy mild  duodenopathy. He had a colonoscopy a few years ago at [facility] that  was normal. [name] first noticed a severe bout of epigastric  pain 12 years ago. He was seen at [facility]   12 years ago, and had a  normal EGD at that time. Had a few years of doing okay. Then  again a few years ago, had an episode of severe epigastric pain  after eating 3-4 hot dogs, again resolved on its own. More  recently a few months ago, he had an episode of significant  epigastric pain- set off by eating high volume fatty food, this  was while being on keto diet. Of note in 2019 did keto diet for  about 1 year, lost about 15 lbs. Since this january 2021 episode,  he  has resumed eating carbs and now focusing on low-fat diet.  Started omeprazole 20mg  once daily (takes in morning, doesn't eat  breakfast) 6 weeks ago. More recently, his symptoms of  post-prandial epigastric pain have been significantly improved  (he's unsure if was more dietary modifications or PPI). Recent  LFTs notable for ALT 337 09/27/19, remainder of LFTs normal, then  10/23/19 LFTs showing normal transaminases, mildly elevated total  bilirubin to 1.2, lipase/amylase normal. 03/2018 abdominal MRI was  notable for normal appearing gallbladder. He has been  self-performing abdominal US, no cholelithiasis seen or biliary  ductal dilation. He feels like he may have IBS. He typically has  one regular BM/day. Denies heartburn or reflux symptoms. No  persistent dysphagia/odynophagia. Denies diarrhea or  constipation. Weight is stable, no fevers/chills or night sweats.  Denies overt GI bleeding symptoms.  .  Ambulatory Falls and Injury Prevention:  HPI  .  Have you experienced a fall in the past year?No , Is the patient  using assistive devices such as a cane or walker?No , Do you need  assistance with ambulation while at our facility?No ,  Interventionsnone, patient not a fall risk  .  Marland Kitchen  CURRENT MEDICATIONS  .  Taking Amlodipine Besylate 5 MG Tablet 1 tablet Orally Once a day  Taking Aspirin 81 81 MG Tablet Delayed Release as directed Orally  Taking Crestor 40 MG Tablet 1  tablet Orally Once a day  Taking Lisinopril 40 mg Tablet 1 tablet Orally Once a day  Taking Omeprazole 20 MG Capsule Delayed Release 1 capsule 30  minutes before morning meal Orally once daily  Taking Urocit-K 15 15 MEQ (1620 MG) Tablet Extended Release 2  tablet with meals Orally Twice a day  .  PAST MEDICAL HISTORY  .  Hypertension  Hypercholesterolemia  Recurrent kidney stones  .  ALLERGIES  .  Penicillin: anaphylaxis - Allergy  .  SURGICAL HISTORY  .  Closed manipulation and pinning, right middle mallet finger.  06/15/2018  Ureteroscopy with  Lithotripsy, Dr. 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  FAMILY HISTORY  .  Mother: alive, hypertension, diagnosed with ADHD,  hyperactive-impulsive type  Father: cabg; cad, ADHD, hyperactive-impulsive type  Siblings: sister healthy  .  SOCIAL HISTORY  .  .  Tobacco  history: Never smoked.  .  Work/Occupation: employed full-time.  .  Alcohol  Socially.  .  Abuse/NeglectDo you feel unsafe in your relationships?No , Have  you ever been hit, kicked, punched or otherwise hurt by someone  in the past year? No , PlanPatient states they feel safe to  return home.  .  Illicit drugs: Denies.  .  Patient is married. Two children. Works as a Marine scientist.  .  HOSPITALIZATION/MAJOR DIAGNOSTIC PROCEDURE  .  Denies Past Hospitalization  .  REVIEW OF SYSTEMS  .  GI [place]:  .  Constitutional:    Denies fever, Weight loss, Weight gain,  Fatigue . Gastrointestinal:    Endorses epigastric abdominal  pain, occasional reflux, Denies ongoing Difficulty swallowing,  Nausea, Vomiting, Constipation, Diarrhea, Stool incontinence,  Bloody stools, Black stools, Yellowing of Skin, Unexplained  itching . Endocrine:    Denies Always hot, Always cold .  Skin/Bones:    Denies Rashes, Change in skin color .  Cardiovascular:    Denies Chest pain, Palpitations, Ankle  swelling . Respiratory:    Denies Coughing, Wheezing, Shortness  of breath . HEENT:    Denies Seasonal allergies . Hematologic:     Denies Easy bleeding, Swollen lymph nodes . Genital/Urinary:     Denies Burning with urination . Neurologic:    Denies Headaches,  Lightheaded/Dizzy, Seizures . Psychiatric:    Denies Depression,  Anxiety .  Marland Kitchen  VITAL SIGNS  .  Pain scale 0, Ht-in 68, Wt-lbs 195, BMI 29.65, BP 130/87, BSA  2.06, O2 100%RA, Ht-cm 172.72, Wt-kg 88.45, Wt Change 3.4 lb.  .  PHYSICAL EXAMINATION  .  GI Dr.  Barbera Setters:  General:  Well appearing, well developed, well nourished, in no  acute distress. Head:  Normocephalic, atraumatic. Eyes:   Anicteric sclerae, no conjunctival pallor . Mouth:  Tongue and  buccal mucosa normal, Posterior pharynx clear. Nodes:  Negative  cervical and supraclavicular. Heart:  Regular rate and rhythm,  normal S1, S2, no murmurs/rubs/gallops. Lungs:  Clear to  auscultation bilaterally. Abdomen:  Soft, Normal bowel sounds,  non-tender, nondistended, without hepatosplenomegaly or masses  appreciated, No evident ascites, no hernias noted, No guarding or  rebound tenderness. Extremities:  No clubbing, cyanosis, or  edema. Joints:  No upper extremity or lower extremity synovitis  noted. Skin:  No jaundice or rashes. Neurologic:  Cranial nerves  grossly intact, Moves all extremities without difficulty, Grossly  nonfocal exam, Gait normal.  .  ASSESSMENTS  .  Gastroesophageal reflux disease with esophagitis, unspecified  whether hemorrhage - K21.00 (Primary)  .  Pain of upper abdomen - R10.10  .  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  [name] in follow-up after recent EGD and for recurrent  epigastric abdominal pain.Unclear etiology of his recurrent  abdominal pain to this point. Recent work-up notable for isolated  transient ALT elevation to 331 09/2019 (has since normalized),  normal lipase, and an EGD with esophageal biopsies suggestive of  GERD, relatively normal stomach (negative for H Pylori). Prior  imaging without cholelithiasis. Etiology of his symptoms is most  likely GERD (with associated atypical dyspepsia type symptoms),  could be biliary colic though, isolated ALT elevation in January  may have been due to transient  choledocholithiasis/passed stone.  At this time, we recommend optimizing PPI dosing timing to  Omeprazole 20mg  once daily 30 minutes before dinner, obtaining  abdominal MRI and MRCP (to further assess biliary tree and for  cholelithiasis), and we will arrange follow-up in 2-3 months to  follow-up MRI and for symptom re-assessment. ATTENDING  ATTESTATION: The patient was personally seen and examined by me  (Dr. Barbera Setters) in conjunction with Dr. Nolon Lennert, and I agree  with the Hx and PE, Assessment and Plan as outlined in Dr.  Julio Sicks note. I contributed to documentation of the note, and  edited the note where necessary. 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 In conjunction with the  data reviewed above, differential diagnostic considerations  include gastroesophageal reflux disease with esophageal spasm,  occult cholelithiasis undetected by his self-administered  ultrasound exams, chronic mesenteric ischemia. Our plan is to  have him continue omeprazole 20 mg by mouth once daily to be  taken 30 to 60 minutes before dinner. We are requesting MRI  abdomen without (MRCP) and with contrast to further assess for  gallstone disease or underlying pancreatic pathology, and we will  see him back for review.  .  TREATMENT  .  Pain of upper abdomen  MR ABDOMEN W + WO [id number]  Notes: 1) continue omperazole 20mg  daily- re-time to 30 minutes  before dinner  2) Schedule for Abdominal MRI with and without contrast (MRCP)  3) Schedule for follow-up visit (in person or telehealth visit)  with Dr. Barbera Setters in 2-3 months  4) Check LFTs every 3 months with PCP Dr. [name].  .  FOLLOW UP  .  3 Months  .  Marland Kitchen  Appointment Provider: Sandrea Matte, MD  .  Electronically signed by Esau Grew , MD  on  12/10/2019 at 01:36 PM EDT  .  CONFIRMATORY SIGN OFF  .  Marland Kitchen  Document electronically signed by Sandrea Matte

## 2019-12-03 NOTE — Progress Notes (Signed)
* * *      Brandon Harper MD **DOB:** 10-12-1965 [id number] yo M) **Acc No.** [id number] **DOS:**  12/03/2019    ---       Brandon [name]**    ------    21 Y old Male, DOB: January 01, 1966    [address] [name], [place] [address]    Home: [phone]    Provider: Esau Grew        * * *    Telephone Encounter    ---    Answered by  Felecia Jan Date: 12/03/2019       Time: 11:47 AM    Reason  FU    ------            Message                     PT needs a 2-3 mth fu please call and book                 Action Taken                     [facility]  12/03/2019 11:48:26 AM >      Nguyen,Michelle  12/03/2019 12:10:25 PM > created action reminder for 5/03 to call patient                    * * *                ---          * * *         Provider: Esau Grew 12/03/2019    ---    Note generated by eClinicalWorks EMR/PM Software ([email or web address])

## 2019-12-03 NOTE — Progress Notes (Signed)
* * *      [name] MD **DOB:** 1966-04-21 (54 yo M) **Acc No.** [id number] **DOS:**  12/03/2019    ---       [name]**    ------    62 Y old Male, DOB: [birth year]    [address] [name], [place] [address]    Home: [phone]    Provider: Esau Grew        * * *    Telephone Encounter    ---    Answered by  Felecia Jan Date: 12/03/2019       Time: 11:48 AM    Reason  checkout list    ------            Message                     1.send encounter to [facility] for fu      2.gave print out for outside labs and MRI                       Action Taken                     [facility]  12/03/2019 11:49:44 AM >                     * * *                ---          * * *         Provider: Esau Grew 12/03/2019    ---    Note generated by eClinicalWorks EMR/PM Software ([email or web address])

## 2019-12-03 NOTE — Progress Notes (Signed)
 * * *    Brandon [name], Brandon [name] **DOB:** Apr 06, 1966 (54 yo M) **Acc No.** [id number] **DOS:**  12/03/2019    ---       Brandon [name]**    ------    54 Y old Male, DOB: 1966/04/15, External MRN: [id number]    Account Number: [id number]    [address], [name], [place]    Home: [phone]    Guarantor: Brandon [name] Insurance: [facility] PPO    PCP: [name], [name] Referring: [name], [name]    Appointment Facility: [facility]        * * *    12/03/2019  **Appointment Provider:** Sandrea Matte, [name] **CHN#:** [id number]    ------     **Supervising Provider:** Esau Grew, [name]    ---       **Reason for Appointment**    ---      1\. 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 [name] in follow-up after recent EGD and for recurrent epigastric  abdominal pain.    EGD done 08/24/2019 by Dr. Jennye Moccasin for further evaluation of dysphagia. He  had reported occasional solid food getting stuck in chest every few months  over last few years. No odynophagia. EGD showed a mildly irregular Z-line at  42cm, but otherwise normal esophageal mucosa (distal biopsies- consistent with  chronic reflux esophagitis, mid biopsies with just 2 intra-epithelial  eosinophils per HPF, proximal-normal), 2 small gastric erosions (biopsies-  negative for HP), and patchy mild duodenopathy. He had a colonoscopy a few  years ago at [facility] that was normal.    Brandon [name] first noticed a severe bout of epigastric pain 12 years ago. He was  seen at [facility] ~12 years ago, and had a normal EGD at that time. Had a few years  of doing okay. Then again a few years ago, had an  episode of severe epigastric  pain after eating 3-4 hot dogs, again resolved on its own.    More recently a few months ago, he had an episode of significant epigastric  pain- set off by eating high volume fatty food, this was while being on keto  diet. Of note in 2019 did keto diet for about 1 year, lost about 15 lbs. Since  this january 2021 episode, he has resumed eating carbs and now focusing on  low-fat diet. Started omeprazole 20mg  once daily (takes in morning, doesn't  eat breakfast) 6 weeks ago. More recently, his symptoms of post-prandial  epigastric pain have been significantly improved (he's unsure if was more  dietary modifications or PPI). Recent LFTs notable for ALT 337 09/27/19,  remainder of LFTs normal, then 10/23/19 LFTs showing normal transaminases,  mildly elevated total bilirubin to 1.2, lipase/amylase normal. 03/2018  abdominal MRI was notable for normal appearing gallbladder. He has been self-  performing abdominal US, no cholelithiasis seen or biliary ductal dilation. He  feels like he may have IBS. He typically has one regular BM/day. Denies  heartburn or reflux symptoms. No persistent dysphagia/odynophagia. Denies  diarrhea or constipation. Weight is stable, no fevers/chills or night sweats.  Denies overt GI bleeding symptoms.     _Ambulatory Falls and Injury Prevention_ :    HPI Have you experienced a fall in the past year? No, Is the patient using  assistive devices such as a cane or walker? No, Do you need assistance with  ambulation while at our facility? No, Interventions none, patient not a fall  risk .     **Current Medications**    ---    Taking    * Amlodipine Besylate 5 MG Tablet 1 tablet Orally Once a day    ---    * Aspirin 81 81 MG Tablet Delayed Release as directed Orally     ---    * Crestor 40 MG Tablet 1 tablet Orally Once a day    ---    * Lisinopril 40 mg Tablet 1 tablet Orally Once a day    ---    * Omeprazole 20 MG Capsule Delayed Release 1 capsule 30 minutes before  morning meal Orally once daily    ---    * Urocit-K 15 15 MEQ (1620 MG) Tablet Extended Release 2 tablet with meals Orally Twice a day    ---      **Past Medical History**    ---      Hypertension.        ---    Hypercholesterolemia.        ---    Recurrent kidney stones.        ---      **Surgical History**    ---      Closed manipulation and pinning, right middle mallet finger. 06/15/2018    ---    Ureteroscopy with Lithotripsy, Dr. Dolan Amen [facility], Urology)  06/07/2018    ---    Cystoscopy w/ureteral stent placement 12/27/2008    ---    Partial left nephrectomy for papillary renal cell carcinoma, 4 cm [facility], Urology) 09/27/2018    ---      **Family History**    ---      Mother: alive, hypertension, diagnosed with ADHD, hyperactive-impulsive  type    ---    Father: cabg; cad, ADHD, hyperactive-impulsive type    ---    Siblings: sister healthy    ---      **Social History**    ---    Tobacco history: Never smoked.    Work/Occupation: employed full-time.    Alcohol Socially.    Abuse/Neglect  Do you feel unsafe in your relationships? No, Have you ever  been hit, kicked, punched or otherwise hurt by someone in the past year? No,  Plan Patient states they feel safe to return home.    Illicit drugs: Denies.  Patient is married. Two children. Works as a  Marine scientist.    ---      **Allergies**    ---      Penicillin: anaphylaxis - Allergy    ---      **Hospitalization/Major Diagnostic Procedure**    ---      Denies Past Hospitalization    ---      **Review of Systems**    ---     _GI [place]_ :    Constitutional: Denies fever, Weight loss, Weight gain, Fatigue.  Gastrointestinal: Endorses epigastric abdominal pain, occasional reflux,  Denies ongoing Difficulty swallowing, Nausea, Vomiting, Constipation,  Diarrhea, Stool incontinence, Bloody stools, Black stools, Yellowing of Skin,  Unexplained itching. Endocrine: Denies  Always hot, Always cold. Skin/Bones:  Denies Rashes, Change in skin  color. Cardiovascular: Denies Chest pain,  Palpitations, Ankle swelling. Respiratory: Denies Coughing, Wheezing,  Shortness of breath. HEENT: Denies Seasonal allergies. Hematologic: Denies  Easy bleeding, Swollen lymph nodes. Genital/Urinary: Denies Burning with  urination. Neurologic: Denies Headaches, Lightheaded/Dizzy, Seizures.  Psychiatric: Denies Depression, Anxiety.         **Vital Signs**    ---    Pain scale 0, Ht-in 68, Wt-lbs 195, BMI 29.65, BP 130/87, BSA 2.06, O2 100%RA,  Ht-cm 172.72, Wt-kg 88.45, Wt Change 3.4 lb.      **Physical Examination**    ---     _GI Dr. Barbera Setters :    General: Well appearing, well developed, well nourished, in no acute distress.  Head: Normocephalic, atraumatic. Eyes: Anicteric sclerae, no conjunctival  pallor . Mouth: Tongue and buccal mucosa normal, Posterior pharynx clear.  Nodes: Negative cervical and supraclavicular. Heart: Regular rate and rhythm,  normal S1, S2, no murmurs/rubs/gallops. Lungs: Clear to auscultation  bilaterally. Abdomen: Soft, Normal bowel sounds, non-tender, nondistended,  without hepatosplenomegaly or masses appreciated, No evident ascites, no  hernias noted, No guarding or rebound tenderness. Extremities: No clubbing,  cyanosis, or edema. Joints: No upper extremity or lower extremity synovitis  noted. Skin: No jaundice or rashes. Neurologic: Cranial nerves grossly intact,  Moves all extremities without difficulty, Grossly nonfocal exam, Gait normal.         **Assessments**    ---    1\. Gastroesophageal reflux disease with esophagitis, unspecified whether  hemorrhage - K21.00 (Primary)    ---    2\. Pain of upper abdomen - R10.10    ---     Brandon [name] is a 54 year-old man with PMH of hypertension,  hyperlipidemia, recurrent nephrolithiasis, CKD, left papillary renal cell  carcinoma s/p 2019 partial nephrectomy, CAD post 10/01/2017 mid LAD PCI,  presenting to [facility] clinic as new patient at  referral by his PCP  Dr. [name] in follow-up after recent EGD and for  recurrent epigastric abdominal pain.    Unclear etiology of his recurrent abdominal pain to this point. Recent work-up  notable for isolated transient ALT elevation to 331 09/2019 (has since  normalized), normal lipase, and an EGD with esophageal biopsies suggestive of  GERD, relatively normal stomach (negative for H Pylori). Prior imaging without  cholelithiasis. Etiology of his symptoms is most likely GERD (with associated  atypical dyspepsia type symptoms), could be biliary colic though, isolated ALT  elevation in January may have been due to transient choledocholithiasis/passed  stone. At this time, we recommend optimizing PPI dosing timing to Omeprazole  20mg  once daily 30 minutes before dinner, obtaining abdominal MRI and MRCP (to  further assess biliary tree and for cholelithiasis), and we will arrange  follow-up in 2-3 months to follow-up MRI and for symptom re-assessment.    ATTENDING ATTESTATION: The patient was personally seen and examined by me (Dr.  Barbera Setters) in conjunction with Dr. Nolon Lennert, and I agree with the Hx and  PE, Assessment and Plan as outlined in Dr. Julio Sicks note. I contributed to  documentation of the note, and edited the note where necessary. 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 In conjunction with the data reviewed above, differential  diagnostic considerations include  gastroesophageal reflux disease with  esophageal spasm, occult cholelithiasis undetected by his self-administered  ultrasound exams, chronic mesenteric ischemia. Our plan is to have him  continue omeprazole 20 mg by mouth once daily to be taken 30 to 60 minutes  before dinner. We are requesting MRI abdomen without (MRCP) and with contrast  to  further assess for gallstone disease or underlying pancreatic pathology,  and we will see him back for review.    ---      **Treatment**    ---      **1\. Pain of upper abdomen**    _IMAGING: MR ABDOMEN W + WO CONTRAST_    Notes: 1) continue omperazole 20mg  daily- re-time to 30 minutes before dinner    2) Schedule for Abdominal MRI with and without contrast (MRCP)    3) Schedule for follow-up visit (in person or telehealth visit) with Dr.  Barbera Setters in 2-3 months    4) Check LFTs every 3 months with PCP Dr. [name].    ---     **Follow Up**    ---    3 Months    **Appointment Provider:** Sandrea Matte, [name]    Electronically signed by Esau Grew , [name] on 12/10/2019 at 01:36 PM EDT    Sign off status: Completed        * * *        GI Clinic    [address] [place], [address]    [place], [place] [address]    Tel: [phone]    Fax: [phone]              * * *          Progress Note: Sandrea Matte, [name] 12/03/2019    ---    Note generated by eClinicalWorks EMR/PM Software ([email or web address])

## 2019-12-05 ENCOUNTER — Ambulatory Visit

## 2019-12-24 ENCOUNTER — Ambulatory Visit: Admitting: Internal Medicine

## 2019-12-24 ENCOUNTER — Ambulatory Visit

## 2019-12-24 LAB — HX CHEM-PANELS
HX BLOOD UREA NITROGEN: 26 mg/dL — ABNORMAL HIGH (ref 6–24)
HX CREATININE (CR): 1.41 mg/dL — ABNORMAL HIGH (ref 0.57–1.30)
HX GFR, AFRICAN AMERICAN: 66 mL/min/{1.73_m2}
HX GFR, NON-AFRICAN AMERICAN: 57 mL/min/{1.73_m2} — ABNORMAL LOW

## 2019-12-24 NOTE — Progress Notes (Signed)
 Division of General Medicine - Future Orders      Requested tests to be done: __(date)____________________________        Orders     DX Chest PA + Lateral - 2 views [CPT-99999]  Creatinine (CR) [CREATININE] [CPT-82565]  Blood Urea Nitrogen [BUN] [CPT-84520]        Special orders:        Created By Wynonia Lawman MD on 12/24/2019 at 02:09 PM    Electronically Signed By Wynonia Lawman MD on 12/24/2019 at 02:10 PM

## 2019-12-25 ENCOUNTER — Ambulatory Visit

## 2020-02-05 ENCOUNTER — Ambulatory Visit

## 2020-02-05 ENCOUNTER — Ambulatory Visit: Admitting: Internal Medicine

## 2020-02-05 NOTE — Progress Notes (Signed)
 Division of General Medicine - Additional Orders      Orders     C&S Urine (C URINE) [URINE CULT] [CPT-87086]        Created By Tomma Rakers MD on 02/05/2020 at 04:23 PM    Electronically Signed By Tomma Rakers MD on 02/05/2020 at 04:24 PM

## 2020-02-05 NOTE — Progress Notes (Signed)
 Miscellaneous Note      About two days of intermittent left groin pain not related to movement. Pain is 3/10 He wonders is he has another kidney stone. He dipsticked his urine in our office, said it was negative. On exam very tender left  epididymus, no hernia.  ? epididymitis, will send of urine culture. Doubt torsion. Consider ultrasound            Allergies not determined    Plan   Medication List:   LISINOPRIL 20 MG ORAL TABLET (LISINOPRIL) Take one tablet by mouth once daily  ROSUVASTATIN CALCIUM 40 MG TAB (ROSUVASTATIN CALCIUM) TAKE 1 TABLET BY MOUTH EVERY DAY  ASPIRIN 81 MG ORAL TABLET (ASPIRIN) Take one tablet by mouth once daily  TAMSULOSIN HCL 0.4 MG ORAL CAPSULE (TAMSULOSIN HCL) Take one capsule by mouth once daily; Route: ORAL  RA SAW PALMETTO 160 MG ORAL CAPSULE (SAW PALMETTO (SERENOA REPENS)) ; Route: ORAL  LORAZEPAM 0.5 MG ORAL TABLET (LORAZEPAM) one by mouth 30 min prior to MRI. May repeat x 1; Route: ORAL  TRIAMCINOLONE ACETONIDE 0.025 % EXTERNAL CREAM (TRIAMCINOLONE ACETONIDE) Apply to affected area bid; Route: EXTERNAL  DONNATAL 16.2 MG/5ML ORAL ELIXIR (PB-HYOSCY-ATROPINE-SCOPOLAMINE) 5-10 ML 2-3 times daiy as neede for abd pain; Route: ORAL  OMEPRAZOLE 20 MG ORAL CAPSULE DELAYED RELEASE (OMEPRAZOLE) Take one capsule by mouth once daily; Route: ORAL                  Created By Tomma Rakers MD on 02/05/2020 at 04:24 PM    Electronically Signed By Tomma Rakers MD on 02/05/2020 at 04:27 PM

## 2020-02-06 ENCOUNTER — Ambulatory Visit

## 2020-02-06 LAB — HX MICRO

## 2020-02-06 NOTE — Progress Notes (Signed)
 Miscellaneous Note        [name] emailed me results of informal ultrasound:    Yes    Looks like mild hydro, testes neg    I think it?s a little stone, gravel nonsense, less painful now...    ??  Sent from my iPhone      On Feb 05, 2020, at 4:36 PM, Tomma Rakers [email or web address]> wrote:     I know you only had partial left nephrectomy but can get testicular pain on same side as nephrectomy I think particularly on left since testicular vein drains into renal vein. Is this the first time you?ve had testicular pain/tenderness like this since your surgery      He'll drink lots of fluids, urine culture pending  ......................................Marland KitchenTomma Rakers, MD  February 06, 2020 10:10 AM            Allergies not determined    Plan   Medication List:   LISINOPRIL 20 MG ORAL TABLET (LISINOPRIL) Take one tablet by mouth once daily  ROSUVASTATIN CALCIUM 40 MG TAB (ROSUVASTATIN CALCIUM) TAKE 1 TABLET BY MOUTH EVERY DAY  ASPIRIN 81 MG ORAL TABLET (ASPIRIN) Take one tablet by mouth once daily  TAMSULOSIN HCL 0.4 MG ORAL CAPSULE (TAMSULOSIN HCL) Take one capsule by mouth once daily; Route: ORAL  RA SAW PALMETTO 160 MG ORAL CAPSULE (SAW PALMETTO (SERENOA REPENS)) ; Route: ORAL  LORAZEPAM 0.5 MG ORAL TABLET (LORAZEPAM) one by mouth 30 min prior to MRI. May repeat x 1; Route: ORAL  TRIAMCINOLONE ACETONIDE 0.025 % EXTERNAL CREAM (TRIAMCINOLONE ACETONIDE) Apply to affected area bid; Route: EXTERNAL  DONNATAL 16.2 MG/5ML ORAL ELIXIR (PB-HYOSCY-ATROPINE-SCOPOLAMINE) 5-10 ML 2-3 times daiy as neede for abd pain; Route: ORAL  OMEPRAZOLE 20 MG ORAL CAPSULE DELAYED RELEASE (OMEPRAZOLE) Take one capsule by mouth once daily; Route: ORAL                  Created By Tomma Rakers MD on 02/06/2020 at 10:09 AM    Electronically Signed By Tomma Rakers MD on 02/06/2020 at 10:10 AM

## 2020-02-27 ENCOUNTER — Ambulatory Visit

## 2020-02-27 NOTE — Progress Notes (Signed)
 Miscellaneous Note    Over a week ago was eating a bagel and could quite get things down properly- he has a hard time describing exactly what  happened. Hasn't had any problem eating or drinking since. Has noticed he's a bit hoarse (which concerned him given hx renal cancer). Has had some episodes of heartburn.    Was on omeprazole for a few months, stopped several weeks ago. Esophagitis noted on 12/20 endoscopy.    He's just slightly hoarse. Swallowing issues have not persisted. I told him this is most likely related to reflux, he'll go back on omeprazole. I told him best to take 20 min before evening meal. He said his life is so crazy he's not sure he can do that regularly. I told him could take in AM- whatever time he's likely to be compliant, but best to take close to meal. If not improving we'll see if we can get ENT to take a look before his last day here (July 30)  ......................................Marland KitchenTomma Rakers, MD  February 27, 2020 11:47 AM          Allergies not determined    Plan   Medication List:   LISINOPRIL 20 MG ORAL TABLET (LISINOPRIL) Take one tablet by mouth once daily  ROSUVASTATIN CALCIUM 40 MG TAB (ROSUVASTATIN CALCIUM) TAKE 1 TABLET BY MOUTH EVERY DAY  ASPIRIN 81 MG ORAL TABLET (ASPIRIN) Take one tablet by mouth once daily  TAMSULOSIN HCL 0.4 MG ORAL CAPSULE (TAMSULOSIN HCL) Take one capsule by mouth once daily; Route: ORAL  RA SAW PALMETTO 160 MG ORAL CAPSULE (SAW PALMETTO (SERENOA REPENS)) ; Route: ORAL  LORAZEPAM 0.5 MG ORAL TABLET (LORAZEPAM) one by mouth 30 min prior to MRI. May repeat x 1; Route: ORAL  TRIAMCINOLONE ACETONIDE 0.025 % EXTERNAL CREAM (TRIAMCINOLONE ACETONIDE) Apply to affected area bid; Route: EXTERNAL  DONNATAL 16.2 MG/5ML ORAL ELIXIR (PB-HYOSCY-ATROPINE-SCOPOLAMINE) 5-10 ML 2-3 times daiy as neede for abd pain; Route: ORAL  OMEPRAZOLE 20 MG ORAL CAPSULE DELAYED RELEASE (OMEPRAZOLE) Take one capsule by mouth once daily; Route: ORAL                  Created By Tomma Rakers  MD on 02/27/2020 at 10:13 AM    Electronically Signed By Tomma Rakers MD on 02/27/2020 at 11:47 AM

## 2020-03-20 ENCOUNTER — Ambulatory Visit

## 2020-03-20 NOTE — Telephone Encounter (Signed)
 RESPONSE/ORDERS:    Spoke with Brandon [name]. He requests we provide order for Abd MR with/without contrast  for RCCA surveillance .  MRI is scheduled for this Saturday July17,2021.Brandon [name]  Orders completed and given to [employer]. I confirmed with Brandon [name] that  this is an OPEN MRI.  PA was completed by Dorris Carnes and approved through Sept 2021. ......................................Marland KitchenSueanne Margarita, RN  March 20, 2020 11:45 AM      Claustrophobic-requests script for Brandon [name] for MRI .  Dr Noel Gerold will complete -Please send to [facility] [address] [place]. ......................................Marland KitchenSueanne Margarita, RN  March 20, 2020 11:47 AM           ORDERS/PROBS/MEDS/ALL     Problems:   TESTICULAR PAIN, LEFT (ICD-608.9) (ICD10-N50.812)  ANNUAL EXAM (ICD-V72.31) (ICD10-Z00.00)  ELEVATION OF LEVELS OF LIVER TRANSAMINASE LEVELS (ICD10-R74.01)  ABDOMINAL PAIN (ICD-789.00) (ICD10-R10.9)  COVID-19 EVALUATION ENCOUNTER, EXPOSURE RULED OUT (ICD10-Z03.818)  TOE PAIN (ICD-729.5) (ICD10-M79.676)  ODYNOPHAGIA (ICD-787.29) (ICD10-R13.19)  FOOD STICKS ON SWALLOWING (ICD-787.29) (ICD10-R13.10)  CHEST PAIN (ICD-786.50) (ICD10-R07.9)  MYALGIA (ICD-729.1) (ICD10-M79.10)  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)  CERVICAL RADICULOPATHY (ICD-723.4) (ICD10-M54.12)  PAIN, CHEST (ICD-786.50) (ICD10-R07.9)  EXPOSURE TO OTHER HAZARDOUS METALS (ICD-V87.09) (ICD10-Z77.018)  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)  FATIGUE (ICD-780.79) (ICD10-R53.83)  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-N20.0)  UPPER BACK PAIN (ICD-724.5) (ICD10-M54.9)  MALAISE (ICD-780.7) (ICD10-Z87.898)  PLEURITIC CHEST PAIN (ICD-786.52) (ICD10-R07.89)  HEMATURIA, GROSS (ICD-599.7) (ICD10-R31.0)  RENAL MASS (ICD-593.9) (ICD10-N28.89)  ARTHRALGIA (ICD-719.48) (ICD10-M25.50)  CORONARY ARTERY DISEASE (ICD-414.00) (ICD10-I25.10)  HYPERTENSION (ICD-401.9) (ICD10-I10)  URINARY FREQUENCY (ICD-788.41) (ICD10-R35.0)  NEPHROLITHIASIS (ICD-592.0)  (ICD10-N20.0)  RENAL INSUFFICIENCY (ICD-593.9) (ICD10-N28.9)  HYPERLIPIDEMIA (ICD-272.4) (ICD10-E78.5)  HEMATURIA, MICROSCOPIC (ICD-599.72) (ICD10-R31.2)  ACROCHORDON (ICD-701.9) (ICD10-L91.8)  OBESITY (ICD-278.01) (ICD10-E66.01)  SNORING (ICD-786.09) (ICD10-R06.83)  IRRITABLE BOWEL SYNDROME (ICD-564.1) (ICD10-K58.9)    Meds (prior to this call):   LISINOPRIL 20 MG ORAL TABLET (LISINOPRIL) Take one tablet by mouth once daily  ROSUVASTATIN CALCIUM 40 MG TAB (ROSUVASTATIN CALCIUM) TAKE 1 TABLET BY MOUTH EVERY DAY  ASPIRIN 81 MG ORAL TABLET (ASPIRIN) Take one tablet by mouth once daily  TAMSULOSIN HCL 0.4 MG ORAL CAPSULE (TAMSULOSIN HCL) Take one capsule by mouth once daily; Route: ORAL  RA SAW PALMETTO 160 MG ORAL CAPSULE (SAW PALMETTO (SERENOA REPENS)) ; Route: ORAL  LORAZEPAM 0.5 MG ORAL TABLET (LORAZEPAM) one by mouth 30 min prior to MRI. May repeat x 1; Route: ORAL  TRIAMCINOLONE ACETONIDE 0.025 % EXTERNAL CREAM (TRIAMCINOLONE ACETONIDE) Apply to affected area bid; Route: EXTERNAL  DONNATAL 16.2 MG/5ML ORAL ELIXIR (PB-HYOSCY-ATROPINE-SCOPOLAMINE) 5-10 ML 2-3 times daiy as neede for abd pain; Route: ORAL  OMEPRAZOLE 20 MG ORAL CAPSULE DELAYED RELEASE (OMEPRAZOLE) Take one capsule by mouth once daily; Route: ORAL            Created By Sueanne Margarita RN on 03/20/2020 at 10:45 AM    Electronically Signed By Sueanne Margarita RN on 03/20/2020 at 11:48 AM

## 2020-03-25 ENCOUNTER — Ambulatory Visit

## 2020-03-25 NOTE — Telephone Encounter (Signed)
 Call Details:   t reporting pain inleft mid-foot beginning acutely while wakling last week.  Believes that he has torn a tendon.  Has appt with ortho at [facility] this Thurs 7/22. Would like to have a MRI of his ankle/foor prior to seeing ortho. I will send Rx for MRI to [facility] in [place] at his request.  Brandon Sours, MD new cardiologist at [facility] in [facility] has taken him off rosuvastatin given that pt reports "4 tendon tears in past 4 months. Pt will be started on a "monoclonal antibodything" in it's place.  Of note, pt is moving to [place] this Fri 7/23.......................................Marland KitchenSherrilee Gilles, MD  March 25, 2020 1:21 PM        RESPONSE/ORDERS:                 ORDERS/PROBS/MEDS/ALL     Problems:   FOOT PAIN, LEFT (ICD-729.5) (ICD10-M79.672)  TESTICULAR PAIN, LEFT (ICD-608.9) (ICD10-N50.812)  ANNUAL EXAM (ICD-V72.31) (ICD10-Z00.00)  ELEVATION OF LEVELS OF LIVER TRANSAMINASE LEVELS (ICD10-R74.01)  ABDOMINAL PAIN (ICD-789.00) (ICD10-R10.9)  COVID-19 EVALUATION ENCOUNTER, EXPOSURE RULED OUT (ICD10-Z03.818)  TOE PAIN (ICD-729.5) (ICD10-M79.676)  ODYNOPHAGIA (ICD-787.29) (ICD10-R13.19)  FOOD STICKS ON SWALLOWING (ICD-787.29) (ICD10-R13.10)  CHEST PAIN (ICD-786.50) (ICD10-R07.9)  MYALGIA (ICD-729.1) (ICD10-M79.10)  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)  CERVICAL RADICULOPATHY (ICD-723.4) (ICD10-M54.12)  PAIN, CHEST (ICD-786.50) (ICD10-R07.9)  EXPOSURE TO OTHER HAZARDOUS METALS (ICD-V87.09) (ICD10-Z77.018)  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)  FATIGUE (ICD-780.79) (ICD10-R53.83)  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-N20.0)  UPPER BACK PAIN (ICD-724.5) (ICD10-M54.9)  MALAISE (ICD-780.7) (ICD10-Z87.898)  PLEURITIC CHEST PAIN (ICD-786.52) (ICD10-R07.89)  HEMATURIA, GROSS (ICD-599.7) (ICD10-R31.0)  RENAL MASS (ICD-593.9) (ICD10-N28.89)  ARTHRALGIA (ICD-719.48) (ICD10-M25.50)  CORONARY ARTERY DISEASE (ICD-414.00) (ICD10-I25.10)  HYPERTENSION (ICD-401.9) (ICD10-I10)  URINARY  FREQUENCY (ICD-788.41) (ICD10-R35.0)  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-N20.0)  RENAL INSUFFICIENCY (ICD-593.9) (ICD10-N28.9)  HYPERLIPIDEMIA (ICD-272.4) (ICD10-E78.5)  HEMATURIA, MICROSCOPIC (ICD-599.72) (ICD10-R31.2)  ACROCHORDON (ICD-701.9) (ICD10-L91.8)  OBESITY (ICD-278.01) (ICD10-E66.01)  SNORING (ICD-786.09) (ICD10-R06.83)  IRRITABLE BOWEL SYNDROME (ICD-564.1) (ICD10-K58.9)    Meds (prior to this call):   LISINOPRIL 20 MG ORAL TABLET (LISINOPRIL) Take one tablet by mouth once daily  ROSUVASTATIN CALCIUM 40 MG TAB (ROSUVASTATIN CALCIUM) TAKE 1 TABLET BY MOUTH EVERY DAY  ASPIRIN 81 MG ORAL TABLET (ASPIRIN) Take one tablet by mouth once daily  TAMSULOSIN HCL 0.4 MG ORAL CAPSULE (TAMSULOSIN HCL) Take one capsule by mouth once daily; Route: ORAL  RA SAW PALMETTO 160 MG ORAL CAPSULE (SAW PALMETTO (SERENOA REPENS)) ; Route: ORAL  LORAZEPAM 0.5 MG ORAL TABLET (LORAZEPAM) one by mouth 30 min prior to MRI. May repeat x 1; Route: ORAL  TRIAMCINOLONE ACETONIDE 0.025 % EXTERNAL CREAM (TRIAMCINOLONE ACETONIDE) Apply to affected area bid; Route: EXTERNAL  DONNATAL 16.2 MG/5ML ORAL ELIXIR (PB-HYOSCY-ATROPINE-SCOPOLAMINE) 5-10 ML 2-3 times daiy as neede for abd pain; Route: ORAL  OMEPRAZOLE 20 MG ORAL CAPSULE DELAYED RELEASE (OMEPRAZOLE) Take one capsule by mouth once daily; Route: ORAL    Changes to Meds (this update):   Added new medication of * MRI LEFT ANKLE Dx: S88.648 - Signed  Rx of MRI LEFT ANKLE Dx: E72.072;  #1 x 0;  Signed;  Entered by: [name] MD;  Authorized by: [name] MD;  Method used: Printed then faxed to [facility] - [address], [place], [place]  , Ph: [phone], Fax: [phone]          Created By Wynonia Lawman MD on 03/25/2020 at 01:13 PM    Electronically Signed By Wynonia Lawman MD on 04/17/2020 at 10:01 AM

## 2020-04-11 ENCOUNTER — Ambulatory Visit

## 2020-04-11 NOTE — Progress Notes (Signed)
 [email or web address] - email times that work. pt would also like to do a TL instead of in person as he moved to [facility] ......................................Marland KitchenChristene Slates  April 11, 2020 4:05 PM      checked with [place]- cannot do TL as he is out of state ......................................Marland KitchenChristene Slates  April 17, 2020 4:05 PM            Allergies not determined    Plan   Medication List:   LISINOPRIL 20 MG ORAL TABLET (LISINOPRIL) Take one tablet by mouth once daily  ROSUVASTATIN CALCIUM 40 MG TAB (ROSUVASTATIN CALCIUM) TAKE 1 TABLET BY MOUTH EVERY DAY  ASPIRIN 81 MG ORAL TABLET (ASPIRIN) Take one tablet by mouth once daily  TAMSULOSIN HCL 0.4 MG ORAL CAPSULE (TAMSULOSIN HCL) Take one capsule by mouth once daily; Route: ORAL  RA SAW PALMETTO 160 MG ORAL CAPSULE (SAW PALMETTO (SERENOA REPENS)) ; Route: ORAL  LORAZEPAM 0.5 MG ORAL TABLET (LORAZEPAM) one by mouth 30 min prior to MRI. May repeat x 1; Route: ORAL  TRIAMCINOLONE ACETONIDE 0.025 % EXTERNAL CREAM (TRIAMCINOLONE ACETONIDE) Apply to affected area bid; Route: EXTERNAL  DONNATAL 16.2 MG/5ML ORAL ELIXIR (PB-HYOSCY-ATROPINE-SCOPOLAMINE) 5-10 ML 2-3 times daiy as neede for abd pain; Route: ORAL  OMEPRAZOLE 20 MG ORAL CAPSULE DELAYED RELEASE (OMEPRAZOLE) Take one capsule by mouth once daily; Route: ORAL  * MRI LEFT ANKLE Dx: H37.169                  Created By Christene Slates on 04/11/2020 at 04:05 PM    Electronically Signed By Wynonia Lawman MD on 06/09/2020 at 04:18 PM

## 2020-04-25 ENCOUNTER — Ambulatory Visit

## 2020-04-25 NOTE — Progress Notes (Signed)
 [facility] April 25, 2020  [address]  [place]  [place] [address]  Main: [phone]  Fax: [phone]  Patient Portal: [email or web address]            [name], MD  [address]  [place], [place]  [address]    Dear  Mr. Brandon [name],    I am writing this letter to inform you that I will be leaving my Primary Care [facility] practice at [facility] on June 06, 2020; but I won?t be going far.  For more than a decade I have worked at [employer] as well as the [employer], our Quarry manager at [facility].  Starting in October, I will be consolidating my practice at the [facility].    I am reaching out to provide a couple of options to ensure continuity of your care here at [facility].      If you are interested in remaining at the [facility] practice, there are many excellent physicians taking patients.  I am in the process of reviewing all of my patients, and making specific suggestions on your new PCP.  I?ve known you personally as your care provider, so I feel I am best suited to make this referral.  If you would like to make plans on your own to move your care to another of our providers, please note that many are accepting new patients, including:    Dr. Darron Doom  Dr. Feliberto Harts  Dr. Elonda Husky  Dr. Lezlie Lye    If interested in remaining at [facility] with another of our excellent PCPs, please feel free to call [phone] to arrange an appointment with one of these physicians or other available physicians at [facility].  If you have any type of HMO insurance, please be sure to let them know which doctor you are choosing as your new primary care doctor.    If you would like to keep your care with me, I am accepting patients at the [facility].  A concierge practice is similar to a Primary Care practice, however, clinicians carry a smaller  panel and patients pay an annual membership fee.  Concierge practices have been around for some time, and the [facility] is well established.  If you are interested in joining my practice at the [facility] concierge practice, please feel free to call [phone].    Thank you for allowing me the opportunity to take care of you at [facility]. I wish you good health and happiness in the future.      Sincerely,  Wynonia Lawman, MD      Created By Ann Held on 04/25/2020 at 09:10 AM    Electronically Signed By Ann Held on 04/25/2020 at 09:10 AM

## 2020-04-30 ENCOUNTER — Ambulatory Visit

## 2020-05-09 ENCOUNTER — Ambulatory Visit

## 2020-05-09 NOTE — Progress Notes (Signed)
 [facility]  [address]  [place] [address]  Main: [phone]  Fax: [phone]  Patient Portal: [email or web address]      June 19, 2020            MRN: [id number]            DOB: [birth year]   [name], MD   [address]   [place] [address]      The results of your recent tests are as follows:      Cholesterol Tests:      Total Cholesterol   161  Normal 110-199    10/15/2019    HDL (good cholesterol)  29  Normal >40     10/15/2019    Triglyceride    126  Normal 40-250    10/15/2019    LDL (bad cholesterol)   107  Normal <160    10/15/2019       Sincerely,       [name] MD  [facility]  [phone]          Created By Wynonia Lawman MD on 05/09/2020 at 09:14 AM    Electronically Signed By Wynonia Lawman MD on 06/19/2020 at 04:29 PM

## 2020-05-30 ENCOUNTER — Ambulatory Visit

## 2020-05-30 ENCOUNTER — Ambulatory Visit: Admitting: Neurology

## 2020-05-30 ENCOUNTER — Ambulatory Visit: Admitting: Nephrology

## 2020-05-30 ENCOUNTER — Ambulatory Visit: Admitting: Student in an Organized Health Care Education/Training Program

## 2020-05-30 ENCOUNTER — Ambulatory Visit: Admitting: Internal Medicine

## 2020-05-30 ENCOUNTER — Ambulatory Visit: Admitting: Hand Surgery

## 2020-05-30 ENCOUNTER — Ambulatory Visit (HOSPITAL_BASED_OUTPATIENT_CLINIC_OR_DEPARTMENT_OTHER): Admitting: Psychiatry

## 2020-05-30 ENCOUNTER — Ambulatory Visit: Admitting: Rheumatology

## 2020-05-30 LAB — HX CHEM-PANELS
HX ANION GAP: 12 (ref 3–14)
HX BLOOD UREA NITROGEN: 23 mg/dL (ref 6–24)
HX CHLORIDE (CL): 105 meq/L (ref 98–110)
HX CO2: 21 meq/L (ref 20–30)
HX CREATININE (CR): 1.26 mg/dL (ref 0.57–1.30)
HX GFR, AFRICAN AMERICAN: 75 mL/min/{1.73_m2}
HX GFR, NON-AFRICAN AMERICAN: 65 mL/min/{1.73_m2}
HX GLUCOSE: 101 mg/dL (ref 70–139)
HX POTASSIUM (K): 4 meq/L (ref 3.6–5.1)
HX SODIUM (NA): 138 meq/L (ref 135–145)

## 2020-05-30 LAB — HX HEM-ROUTINE
HX BASO #: 0 10*3/uL (ref 0.0–0.2)
HX BASO: 0 %
HX EOSIN #: 0 10*3/uL (ref 0.0–0.5)
HX EOSIN: 1 %
HX HCT: 44.9 % (ref 37.0–47.0)
HX HGB: 15.1 g/dL (ref 13.5–16.0)
HX IMMATURE GRANULOCYTE#: 0 10*3/uL (ref 0.0–0.1)
HX IMMATURE GRANULOCYTE: 0 %
HX LYMPH #: 1.3 10*3/uL (ref 1.0–4.0)
HX LYMPH: 16 %
HX MCH: 29.5 pg (ref 26.0–34.0)
HX MCHC: 33.6 g/dL (ref 32.0–36.0)
HX MCV: 87.9 fL (ref 80.0–98.0)
HX MONO #: 0.6 10*3/uL (ref 0.2–0.8)
HX MONO: 7 %
HX MPV: 10.3 fL (ref 9.1–11.7)
HX NEUT #: 6.4 10*3/uL (ref 1.5–7.5)
HX NRBC #: 0 10*3/uL
HX NUCLEATED RBC: 0 %
HX PLT: 217 10*3/uL (ref 150–400)
HX RBC BLOOD COUNT: 5.11 M/uL (ref 4.20–5.50)
HX RDW: 12.9 % (ref 11.5–14.5)
HX SEG NEUT: 76 %
HX WBC: 8.4 10*3/uL (ref 4.0–11.0)

## 2020-05-30 LAB — HX DIABETES
HX ALBUMIN RANDOM URINE: 9.5 mg/dL
HX GLUCOSE: 101 mg/dL (ref 70–139)
HX HEMOGLOBIN A1C: 5.3 %

## 2020-05-30 LAB — HX CHOLESTEROL
HX CHOLESTEROL: 358 mg/dL — ABNORMAL HIGH (ref 110–199)
HX HIGH DENSITY LIPOPROTEIN CHOL (HDL): 35 mg/dL (ref 35–75)
HX LDL: 275 mg/dL — ABNORMAL HIGH (ref 0–129)
HX TRIGLYCERIDES: 241 mg/dL (ref 40–250)

## 2020-05-30 LAB — HX BF-CHEM/URINE
HX ALBUMIN RANDOM URINE: 9.5 mg/dL
HX ALBUMIN/CREATININE RATIO, URINE: 53 mg/g — ABNORMAL HIGH (ref 0–30)
HX CREATININE, RANDOM URINE: 180.82 mg/dL
HX MICROALBUMIN CALC: 0.05 mg/mg

## 2020-05-30 LAB — HX CHEM-LIPIDS
HX CHOL-HDL RATIO: 10.2
HX CHOLESTEROL: 358 mg/dL — ABNORMAL HIGH (ref 110–199)
HX HIGH DENSITY LIPOPROTEIN CHOL (HDL): 35 mg/dL (ref 35–75)
HX HOURS FAST: 2 h
HX LDL: 275 mg/dL — ABNORMAL HIGH (ref 0–129)
HX TRIGLYCERIDES: 241 mg/dL (ref 40–250)

## 2020-05-30 LAB — HX CHEM-LFT
HX ALANINE AMINOTRANSFERASE (ALT/SGPT): 37 IU/L (ref 0–54)
HX ALKALINE PHOSPHATASE (ALK): 57 IU/L (ref 40–130)
HX ASPARTATE AMINOTRANFERASE (AST/SGOT): 23 IU/L (ref 10–42)
HX BILIRUBIN, DIRECT: 0.4 mg/dL (ref 0.0–0.5)
HX BILIRUBIN, TOTAL: 1.9 mg/dL — ABNORMAL HIGH (ref 0.2–1.1)

## 2020-05-30 LAB — HX HEM-MISC: HX SED RATE: 29 mm — ABNORMAL HIGH (ref 0–20)

## 2020-05-30 LAB — HX CHEM-METABOLIC: HX HEMOGLOBIN A1C: 5.3 %

## 2020-05-30 NOTE — Progress Notes (Signed)
 [facility] May 30, 2020  [address]   [place]  [place] [address]  Main: [phone]  Fax: [phone]  Patient Portal: [email or web address], MD  [address]  [place], [place]  [address]          MR#: [id number]          DOB: [birth year]    To Whom It May Concern:    I am referring Amman Dargan for PHYSICAL THERAPY:    Medical problems include:  CAD, DJD  ?     Diagnosis:      Neck pain - Cervical strain     Frequency: 2-3 times per week    Precautions: none    Please evaluate and treat as indicated.      Sincerely,      [name] MD  [facility]  [phone]      Created By Wynonia Lawman MD on 05/30/2020 at 02:26 PM    Electronically Signed By Wynonia Lawman MD on 05/30/2020 at 02:26 PM

## 2020-05-30 NOTE — Progress Notes (Signed)
 * * *    Alfieri, [name] MD **DOB:** 1966/07/28 (54 yo M) **Acc No.** [id number] **DOS:**  05/30/2020    ---       Brandon [name]**    ------    62 Y old Male, DOB: 08-Dec-1965, External MRN: [id number]    Account Number: [id number]    [address], [id number]    Home: [phone]    Guarantor: Brandon [name] Insurance: [employer] P[employer]    PCP: [name], MD Referring: [name], MD    Appoint[facility] Facility[facility]        * * *    05/30/2020  **Appointment Provider:** Avis Epley    ------     **Supervising Provider:** Yetta Numbers, MD    ---       **Reason for Appointment**    ---      1\. Left palm and index finger pain 8 weeks    ---      **History of Present Illness**    ---     _GENERAL_ :    67Y with multiple concerns regarding upper extremity, last time here for right  CMC, although he reports pain in left CMC, right middle finger DIP pain, whose  biggest complaint today is pain in left palm/base of index finger. he was  opening a fortune cookie 8 weeks ago and had sudden pain, now has pain lifting  bags and objects like his laptop bag. pain is actually improving, patient was  worried he had a lumbrical tear. he isn't takin nsaids because of a renal  mass. also had minor injury to middle finger and feels that he may have torn a  ligament has modified his activity which helped. no numbness/tingling.  Concerned that his statins may have caused his ligamentous issues throughout  his body.      **Current Medications**    ---    Taking    * Aspirin 81 81 MG Tablet Delayed Release as directed Orally     ---    * Lisinopril 40 mg Tablet 1 tablet Orally Once a day    ---    Not-Taking/PRN    * amLODIPine Besylate 5 MG Tablet 1 tablet Orally Once a day    ---    * Crestor 40 MG Tablet 1 tablet Orally Once a day, Notes: stopped July 2021    ---    * Omeprazole 20 MG Capsule Delayed Release 1 capsule 30 minutes before morning meal Orally once daily     ---    * Urocit-K 15 15 MEQ (1620 MG) Tablet Extended Release 2 tablet with meals Orally Twice a day    ---    Medication List reviewed and reconciled with the patient    ---      **Past Medical History**    ---      Hypertension.        ---    Hypercholesterolemia.        ---    Recurrent kidney stones.        ---      **Surgical History**    ---      Closed manipulation and pinning, right middle mallet finger. 06/15/2018    ---    Ureteroscopy with Lithotripsy, Dr. 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    Work/Occupation: employed full-time.    Alcohol Socially.    Abuse/Neglect  Do you feel unsafe in your relationships? No, Have you ever  been hit, kicked, punched or otherwise hurt by someone in the past year? No,  Plan Patient states they feel safe to return home.    Illicit drugs: Denies.  Patient is married. Two children. Works as a  Marine scientist.    ---      **Allergies**    ---      Penicillin: anaphylaxis - Allergy    ---      **Hospitalization/Major Diagnostic Procedure**    ---      No Hospitalization History.    ---      **Review of Systems**    ---     _ORT_ :    Eyes No. Ear, Nose Throat No. Digestion, Stomach, Bowel No. Bladder Problems  No. Bleeding Problems No. Numbness/Tingling No. Anxiety/Depression No.  Fever/Chills/Fatigue No. Chest Pain/Tightness/Palpitations No. Skin Rash No.  Dental Problems No. Joint/Muscle Pain/Cramps Yes. Blackout/Fainting No. Other  No.         **Vital Signs**    ---    Pain  scale 0, Ht-in 68, Ht-cm 172.72.      **Physical Examination**    ---    he appears healthy. exam of right left demonstrates no swelling. skin intact,  no ttp at [facility]. CMC joint stable slight tenderness proximal to A1 of index with  mild thickening and pain with pressure during flexion/extension of IF. no pain  with intrinsic stretch testing. NO TTP over digits. no instability of  ligaments in middle finger. . full motion sensation intact to light touch.  fingers pink and warm with good cap refill.      **Assessments**    ---    1\. Trigger finger, left index finger - M65.322    ---      **Treatment**    ---      **1\. Others**    Notes: Seen and examined with dr. Rodman Pickle. 45M seen by [place] in the past for b/l  CMC pain, improved, now with left index palmar pain, possibly flexor  tenosynovitis, that is resolving. also with ligament sprain of finger, which  is stable and should hopefully continue to improve. Is undergoing reumatologic  workup, which I agree with. continue activity modification until resolved. can  splint for [facility] issues as needed.    ---     **Follow Up**    ---    prn    **Appointment Provider:** Avis Epley    Electronically signed by Yetta Numbers , MD on 05/31/2020 at 07:15 PM EDT    Sign off status: Completed        * * *        [facility]    [address]    [employer], [address]    [place], [place] [address]    Tel: [phone]    Fax:  [phone]              * * *          Progress Note: Brandon [name] 05/30/2020    ---    Note generated by eClinicalWorks EMR/PM Software ([email or web address])

## 2020-05-30 NOTE — Progress Notes (Signed)
 .  Progress Notes  .  Patient: Brandon [name], Brandon [name]  Provider: Ginger [name]    .  DOB: 08-17-1966 Age: 54 Y Sex: Male  .  PCP: Brandon Gilles MD  Date: 05/30/2020  .  --------------------------------------------------------------------------------  .  REASON FOR APPOINTMENT  .  1. ? ALS  .  HISTORY OF PRESENT ILLNESS  .  History of Present Illness:   Dear Dr Brandon [name] had the pleasure of seeing your patient Dr.  Molly Maduro [name] in my neuromuscular clinic today. 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 He  is in the middle of very stressful period in his life, and he  recently took another job at another state, and he has been under  not only professional but also personal stressors.In this  context, he has had a few bouts of choking over the last 2 to 3  months. These occur without any pattern, and in some of them he  has ended up vomiting, but otherwise his swallowing remains fully  normal. He has not noticed any trouble with his speech. After he  moved to [place], about two months ago, has started  noticing some fasciculations in his fingers and toes. He denies  any weakness. Denies any muscle cramps. No clumsiness or loss of  dexterity, or any additional motor symptoms. Denies any sensory  symptoms. Denies any significant pain, although intermittently he  gets clusters of pain in the neck and scapular girdle bilaterally  after exercising strenuously. His physical capabilities have not  decreased. He has noticed himself to be emotionally labile over  the last few months or even years. He endorses mild low grade  depressive symptoms and acknowledges an anxiety level "to the  roof". He endorses a sensation of impending doom, which is not  supported by any specific event. He acknowledges being terrified  about ALS.His past medical history is relevant for renal  malignancy removed a couple of years ago, and a history of spinal  issues including whiplash in the early 90s, which  resulted in  some chronic issues with relapsing neck and bilateral scapular  girdle pain. He also had right muscular TOS which was  successfully managed conservatively. At the time he had an EMG  which was apparently normal. He had some radicular symptoms in  his right arm secondary to C5-C6 disc disease, which were also  managed conservatively with success. 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 Imaging reports below. When he was having all  these symptoms he was strongly concerned about having a multiple  sclerosis.Has his SARS CoV2 vaccine last January and since then  he has had a number of orthopedic events including ligament  injuries in both arms, right ACL tear, left foot pain attributed  to tenderness injury for which he still wearing a boot.Autonomic  ROS reveals nocturia of 2-3 times, no other bladder symptoms. No  G.I. symptoms. No orthostasis.  Brandon Kitchen  PAST RESULTS:   .2-4/19/2021 Normal LFTs, LDH 402, bilirubin 1.2, BUN 26,  creatinine 1.41, amylase 75, lipase 33, normal CBC, CRP, RF  within normal limits.  Brandon Kitchen  PAST IMAGING:   .(09/13/2018) IMPRESSION: Multilevel degenerative changes of the  cervical spine as detailed above, with moderate to severe spinal  canal narrowing at C5-C6 as well as severe left greater than  right neural foraminal narrowing.  .  CURRENT MEDICATIONS  .  Taking Aspirin 81 81 MG Tablet Delayed Release as directed Orally  Taking  Lisinopril 40 mg Tablet 1 tablet Orally Once a day  Taking Saw Palmetto  Not-Taking/PRN amLODIPine Besylate 5 MG Tablet 1 tablet Orally  Once a day  Not-Taking/PRN Crestor 40 MG Tablet 1 tablet Orally Once a day,  Notes: stopped July 2021  Not-Taking/PRN Omeprazole 20 MG Capsule Delayed Release 1 capsule  30 minutes before morning meal Orally once daily  Not-Taking/PRN Urocit-K 15 15 MEQ (1620 MG) Tablet Extended  Release 2 tablet with meals Orally Twice a day  Medication List reviewed and reconciled with the patient  .  PAST MEDICAL  HISTORY  .  Hypertension  Hypercholesterolemia  Recurrent kidney stones  .  ALLERGIES  .  Penicillin: anaphylaxis - Allergy  .  SURGICAL HISTORY  .  Closed manipulation and pinning, right middle mallet finger.  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  FAMILY HISTORY  .  Mother: alive, hypertension, diagnosed with ADHD,  hyperactive-impulsive type  Father: cabg; cad, diagnosed with ADHD, hyperactive-impulsive  type  Siblings: sister healthy  .  SOCIAL HISTORY  .  .  Tobacco  history: Never smoked.  .  .  Work/Occupation: employed full-time.  .  .  Alcohol  Socially.  .  .  Abuse/Neglect  Do you feel unsafe in your relationships?No  Have you ever been hit, kicked, punched or otherwise hurt by  someone in the past year? No  PlanPatient states they feel safe to return home  .  Brandon Kitchen  Illicit drugs: Denies.  .  Patient is married. Two children. Works as a Marine scientist.  .  REVIEW OF SYSTEMS  .  Endorses difficulty swallowing, heartburn, poor sleep,  depression.Denies fever, weight gain, unexplained fatigue, double  vision, palpitations, shortness of breath, cough, nausea,  diarrhea, blood in stool, sweats, weight loss, blurry vision,  tinnitus, nosebleeds, chest pain or pressure, wheezing, loss of  appetite, vomiting, stomach ache, constipation, drooping in one  or both eyelids, difficulty urinating, lumps, weakness,  incontinence, rash, painful joints, numbness in extremities,  black stool.  Brandon Kitchen  VITAL SIGNS  .  Pain scale 0, Ht-in 68, Wt-lbs 179, BMI 27.21, BP 142/88, HR 81,  BSA 1.97, Ht-cm 172.72, Wt-kg 81.19, Wt Change -1 lb.  .  PHYSICAL EXAMINATION  .  GENERAL EXAMINATION:WDWN, no acute distress. Normocephalic,  atraumatic. Normal skin color. No carotid bruits, normal cardiac  auscultation, palpable temporal  artery pulses, without  ingurgitation or tenderness. Pulmonary auscultation without  abnormalities. Normal abdomen, without [name] enlargement. Normal  extremities, with palpable pulses and no trophic changes.  NEUROLOGICAL EXAMINATIONMS: intact. Non-dysarthric speech.  Negative glabellar reflex.-Cranial Nerves: [II]: deferred  funduscopic examination, PERRL. No VF defects to confrontation.  [II,IV,VI]: Normal EOM. No nystagmus. [V]: Normal facial  sensation. Normal jaw jerk.[VII]: no facial asymmetry or  weakness. [VIII]: normal hearing bilaterally. [IX, X]: Normal  pharyngeal movements. Uvula in midline.[XI]: Normal strength of  SCM and trapezius.[XII]: normal tongue movements w/o  atrophy.-Motor examination: normal power and muscle bulk  throughout. -Reflexes: 2+ throughout. Toes down going. No  Hoffman's. Normal abdominal reflexes-Sensory examination: normal  vibration/proprioception/pinprick and light touch  throughout.-Coordination: finger to nose and heel to shin normal.  No dysdiadochokinesia.-Station and gait: normal station, gait,  and tandem gait. No Romberg.  .  ASSESSMENTS  .  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 However, all the symptoms are easily  explained by a state of chronic anxiety. There is no pattern of  progression or exacerbation as would be expected in a  neurodegenerative disorder, and his neurological exam is  completely normal, suggesting that there is no underlying  neuromuscular explanation for these symptoms. In my opinion, the  whole presentation appears quite benign and I don't think further  neurophysiological testing with EMG is needed at this point.  However, if symptoms do not follow a satisfactory course, I would  request an EMG down the line to rule out any neuromuscular  explanation. He might need a C-spine imaging follow-up. In my  opinion a sedating antidepressant  such as citalopram, which he  has taken in the past, might be some benefit in this context. I  would be happy to follow in the future should he have any  additional questions, concerns, or new symptoms. He has my phone  and email and he will reach in case of need.  Brandon Kitchen  PREVENTIVE MEDICINE  .  I spent 65 minutes in both face-to-face interaction with the  patient in history, examination, and lengthy discussion, as well  as review of prior medical records and laboratory workup.  .  FOLLOW UP  .  prn  .  Electronically signed by Brandon [name] , MD on  06/02/2020 at 09:30 AM EDT  .  Document electronically signed by Brandon [name]    .
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
## 2020-05-30 NOTE — Progress Notes (Signed)
 .  Progress Notes  .  Patient: Brandon [name], Brandon [name]  Provider: Avis Epley    .  DOB: 1966/03/31 Age: 54 Y Sex: Male  Supervising Provider:: Yetta Numbers, MD  Date: 05/30/2020  .  PCP: [name] MD  Date: 05/30/2020  .  --------------------------------------------------------------------------------  .  REASON FOR APPOINTMENT  .  1. Left palm and index finger pain 8 weeks  .  HISTORY OF PRESENT ILLNESS  .  GENERAL:   45M with multiple concerns regarding upper extremity, last time  here for right [facility], although he reports pain in left CMC, right  middle finger DIP pain, whose biggest complaint today is pain in  left palm/base of index finger. he was opening a fortune cookie 8  weeks ago and had sudden pain, now has pain lifting bags and  objects like his laptop bag. pain is actually improving, patient  was worried he had a lumbrical tear. he isn't takin nsaids  because of a renal mass. also had minor injury to middle finger  and feels that he may have torn a ligament has modified his  activity which helped. no numbness/tingling. Concerned that his  statins may have caused his ligamentous issues throughout his  body.  .  CURRENT MEDICATIONS  .  Taking Aspirin 81 81 MG Tablet Delayed Release as directed Orally  Taking Lisinopril 40 mg Tablet 1 tablet Orally Once a day  Not-Taking/PRN amLODIPine Besylate 5 MG Tablet 1 tablet Orally  Once a day  Not-Taking/PRN Crestor 40 MG Tablet 1 tablet Orally Once a day,  Notes: stopped July 2021  Not-Taking/PRN Omeprazole 20 MG Capsule Delayed Release 1 capsule  30 minutes before morning meal Orally once daily  Not-Taking/PRN Urocit-K 15 15 MEQ (1620 MG) Tablet Extended  Release 2 tablet with meals Orally Twice a day  Medication List reviewed and reconciled with the patient  .  PAST MEDICAL HISTORY  .  Hypertension  Hypercholesterolemia  Recurrent kidney stones  .  ALLERGIES  .  Penicillin: anaphylaxis - Allergy  .  SURGICAL HISTORY  .  Closed manipulation and pinning, right  middle mallet finger.  06/15/2018  Ureteroscopy with Lithotripsy, Dr. 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  FAMILY HISTORY  .  Mother: alive, hypertension, diagnosed with ADHD,  hyperactive-impulsive type  Father: cabg; cad, diagnosed with ADHD, hyperactive-impulsive  type  Siblings: sister healthy  .  SOCIAL HISTORY  .  .  Tobacco  history: Never smoked.  .  Work/Occupation: employed full-time.  .  Alcohol  Socially.  .  Abuse/NeglectDo you feel unsafe in your relationships?No , Have  you ever been hit, kicked, punched or otherwise hurt by someone  in the past year? No , PlanPatient states they feel safe to  return home.  .  Illicit drugs: Denies.  .  Patient is married. Two children. Works as a Marine scientist.  .  HOSPITALIZATION/MAJOR DIAGNOSTIC PROCEDURE  .  No Hospitalization History.  Marland Kitchen  REVIEW OF SYSTEMS  .  ORT:  .  Eyes    No . Ear, Nose Throat    No . Digestion, Stomach, Bowel     No . Bladder Problems    No . Bleeding Problems    No .  Numbness/Tingling    No . Anxiety/Depression    No .  Fever/Chills/Fatigue    No .  Chest Pain/Tightness/Palpitations     No . Skin Rash    No . Dental Problems    No . Joint/Muscle  Pain/Cramps    Yes . Blackout/Fainting    No . Other    No .  .  VITAL SIGNS  .  Pain scale 0, Ht-in 68, Ht-cm 172.72.  Marland Kitchen  PHYSICAL EXAMINATION  .  he appears healthy. exam of right left demonstrates no swelling.  skin intact, no ttp at [facility]. CMC joint stable slight tenderness  proximal to A1 of index with mild thickening and pain with  pressure during flexion/extension of IF. no pain with intrinsic  stretch testing. NO TTP over digits. no instability of ligaments  in middle finger. . full motion sensation intact to light touch.  fingers pink and warm with good cap refill.  .  ASSESSMENTS  .  Trigger finger, left index finger -  M65.322  .  TREATMENT  .  Others  Notes: Seen and examined with dr. Rodman Pickle. 52M seen by [place] in the  past for b/l CMC pain, improved, now with left index palmar pain,  possibly flexor tenosynovitis, that is resolving. also with  ligament sprain of finger, which is stable and should hopefully  continue to improve. Is undergoing reumatologic workup, which I  agree with. continue activity modification until resolved. can  splint for [facility] issues as needed.  .  FOLLOW UP  .  prn  .  Marland Kitchen  Appointment Provider: Zandra Abts [name]  .  Electronically signed by Yetta Numbers , MD on  05/31/2020 at 07:15 PM EDT  .  CONFIRMATORY SIGN OFF  .  Marland Kitchen  Document electronically signed by Avis Epley    .
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
## 2020-05-30 NOTE — Progress Notes (Signed)
.    Progress Notes  .  Patient: Brandon [name], Brandon [name]  Provider: Mar Daring  DOB: [birth year] Age: 54 Y Sex: Male  .  PCP: [name] MD  Date: 05/30/2020  .  --------------------------------------------------------------------------------  .  REASON FOR APPOINTMENT  .  1. IN-PERSON/MEYER/AOC  .  CURRENT MEDICATIONS  .  Taking Aspirin 81 81 MG Tablet Delayed Release as directed Orally  Taking Lisinopril 40 mg Tablet 1 tablet Orally Once a day  Taking Saw Palmetto  Not-Taking/PRN amLODIPine Besylate 5 MG Tablet 1 tablet Orally  Once a day  Not-Taking/PRN Crestor 40 MG Tablet 1 tablet Orally Once a day,  Notes: stopped July 2021  Not-Taking/PRN Omeprazole 20 MG Capsule Delayed Release 1 capsule  30 minutes before morning meal Orally once daily  Not-Taking/PRN Urocit-K 15 15 MEQ (1620 MG) Tablet Extended  Release 2 tablet with meals Orally Twice a day  .  Electronically signed by [name] on  11/04/2020 at 07:13 PM EST  .  Document electronically signed by Mar Daring
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
## 2020-06-02 ENCOUNTER — Ambulatory Visit: Admitting: Neurology

## 2020-06-02 NOTE — Progress Notes (Signed)
 * * *    Brandon [name], [name] MD **DOB:** 03/28/1966 [id number] yo M) **Acc No.** [id number] **DOS:**  06/02/2020    ---       Brandon [name]**    ------    56 Y old Male, DOB: 30-Apr-1966    [address] [place], [place] [address]    Home: [phone]    Provider: Ginger Organ        * * *    Telephone Encounter    ---    Answered by  Ginger Organ Date: 06/02/2020       Time: 01:57 PM    Action Taken                     Sayge Salvato  06/02/2020 1:57:55 PM > emailed received 2 days ago from [name]: Jari Favre,      > Thanks so much for seeing me yesterday.  It was a great relief to hear that you don't think I have anything serious going on neurologically.  It made me feel better.      >       > However, waking up Saturday morning and getting to the airport, I found I was having a bit of difficulty carrying my bag in my right hand.  Additionally, I have developed what feels like weakness in my foot and leg only on the right.  It feels as if I had just done a set of weights with my left and its fatigued, that's how it feels.  It has persisted to today, both symptoms.  My hand feels a little dead like what I described as thoracic outlet but my usual stretching exercises don't seem to be doing the trick.  I'm perceiving a slight tremor in my fingers on the right, very subtle, also like a fatigued hand/forearm.  I'm not sure if I mentioned it but my son contracted COVID the first week of august and I started having mild brain fog symptoms three days later.  The fog was similar to the vaccine in Jan I had at [facility] so I knew that I had had it.  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  Should I be getting some kind of steroid treatment?  I'm kind of struck at the rapidity of the evolving symptomatology.  I'd imagine a much more gradual onset of symptoms for things like Parkinson, ALS, Lewy Body, etc am I wrong?  And one other thing, I'm struck by the depression I'm experiencing, its intensity feels out of proportion  to my current life situation.      >       > Is there something I should do for a next step?  EMG?  Something else?      >       > I'm sorry for seeming like a nut case but my hand and foot just aren't right.      > Brandon [name]      > The information in this e-mail is intended only for the person to whom it is addressed. If you believe this e-mail was sent to you in error and the e-mail contains patient information, please contact the [facility] HIPAA Hotline at [phone]. If the e-mail was sent to you in error but does not contain patient information, contact the sender and properly dispose of the e-mail.      >       > Please consider the environment and  the security of the information contained within or attached to this e-mail before printing or saving to an insecure location.            Brandon [name]  06/02/2020 1:58:21 PM > I called him back today and had a long discussion.  Reviewed imaging of the C-spine shows severe compression detectable 2 years ago.  He is now having symptoms that could be MS, including fatigue, unilateral somatosensory symptoms.  I will screen for MS with brain MRI with and without contrast and screen for worsening cord compression with C-spine MRI with and without contrast.  For now, I do not think an EMG is needed.  He will email the imaging center in [place] to which fax MRI requisition.      Salome Hautala  06/02/2020 1:59:47 PM > Lucrezia Starch; MRI order placed in this encounter. He'll email the fax #      Laqueta Due  06/02/2020 2:08:36 PM > Noted.      [facility]  06/04/2020 9:18:15 AM > Did he email you directly Dr. Iran Planas about the fax #. I can give him a call otherwise.      Nilo Fallin  06/04/2020 4:51:25 PM > Yes, I had forwarded it: EXTERNAL MESSAGE - TREAT LINKS/FILES WITH CARE            [phone]            Sent from my iPhone      [facility]  06/05/2020 9:44:21 AM > Faxed MRI Cervical and Brain Order to [facility] in [facility]. Fax # [phone] and ph # [phone].  PA approved until 08/04/20 Auth # brain Auth # cervical . Informed him this was all set.      Shayle Donahoo  06/09/2020 4:02:11 PM > NOted. Thxs        ------                * * *              * * *        ---      Assessments    ---    1\. Chronic fatigue - R53.82 (Primary)    ---    2\. Skin sensation disturbance - R20.9    ---     Treatment    ---      **1\. Chronic fatigue**    _IMAGING: MR HEAD W + WO CONTRAST_   Kemba Hoppes 06/02/2020 2:02:40 PM : New  symptoms. Rule out MS    ------    _IMAGING: MR SPINE CERVICAL W + WO CONT_  Shantale Holtmeyer 06/02/2020 2:02:24 PM :  Severe stenosis in 2020. Monitoring.    ------         **2\. Skin sensation disturbance**    _IMAGING: MR HEAD W + WO CONTRAST_   Ivo Moga 06/02/2020 2:02:40 PM : New  symptoms. Rule out MS    ------    _IMAGING: MR SPINE CERVICAL W + WO CONT_  Summit Borchardt 06/02/2020 2:02:24 PM :  Severe stenosis in 2020. Monitoring.    ------          * * *         Provider: Ginger Organ 06/02/2020    ---    Note generated by eClinicalWorks EMR/PM Software ([email or web address])

## 2020-06-04 LAB — HX IMMUNOLOGY
HX ANA TITER: 1:160 {titer} — AB
HX ANTI NUCLEAR ANTIBODY SCREEN: REACTIVE — AB
HX C-REACTIVE PROTEIN - CRP: 1.01 mg/L (ref 0.00–7.48)
HX LYME ANTIBODY INDEX: 0.07 {index} (ref 0.00–0.90)
HX LYME ANTIBODY INTERP: NEGATIVE
HX RHEUMATOID FACTOR: <15 [IU]/mL

## 2020-06-06 ENCOUNTER — Ambulatory Visit

## 2020-06-06 NOTE — Telephone Encounter (Signed)
 Call Details:   Warren Lacy the latest update.  After seeing Iran Planas, he told me that I don't have ALS, which was an enormous relief.  Thanks for setting me up with that.  My next appointment was with [facility] and we just talked about HTN strategies, etc, and had a look at my labs.  (OMG).       Elevated SED rate?  Elevated Bili?  Elevated LDL 275!!!    I'm working on getting that drug for the cholesterol.    In the meantime, I woke up Saturday and made my way to the airport.  While I was getting there I was struggling a little bit with my right hand carrying my heavy computer bag.  By the end of the night I was also having some kind of perceived weakness in right foot and calf.  I discussed with my wife and she asked why I hadn't mentioned it to [place], and I told her that I hadn't had the symptom yet until after I saw him.  She said what you have sounds a little bit more like MS vs. ALS.  So over the next few days I was convinced that I had MS.  Jari Favre called me and said that some of my symptoms could be explained by my cervical disc disease. with the exception of my exhaustion (comes on in waves).  He suggested a brain MR.  Seems reasonable.    In the meantime, I ran into this article:    [email or web address]    "In the fall, after Rudolpho Sevin came down with a mild case of Covid-19, she expected to recover and return to her previous energetic life in [place]. After all, she was just 26, and healthy.  But weeks later, she said, "this weird constellation of symptoms began to set in."  She had blurred vision encircled with strange halos. She had ringing in her ears, and everything began to smell like cigarettes or Lysol. One leg started to tingle, and her hands would tremble while putting on eyeliner.  She also developed "really intense brain fog," she said. 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 And during a debate about politics with her husband, Fanny Skates,    RESPONSE/ORDERS:  "I didn't remember what I was trying to say or what my stance was," she said."    I've had the tremors, weakness, one weird episode of smelling something like burning paper, and intense brain fog.  After reading this I really think this is what's going on!  The timing is good.  I've also had a few days of feeling like some kind of low level inflammatory thing was going on.  That feeling you get when you have some mild virus but not fever.   There's one other symptom that might be worth chatting about by phone.  Is there any treatment for these symptoms? Steroids in the subacute period?  Help  [name]    I spoke with [name].  He did not mention most of the multiple symptoms laid out above.  He did tell me that he developed an acute onset of "blurry vision" last week making it difficult for him to do his job, looking at the computer "straining to see images".  The following day he was out with his family and became "hot and sweaty" with a heart rate of 104 and a pulse ox  of 94-96%.  He went to the local emergency room.  Nothing worrisome found on exam or EKG.  They examined his eyes and all seem to be normal.  He had a brain MRI without contrast which did not reveal any abnormal masses.  He lets me know today that within the last 60 minutes or so his vision is beginning to clear.  He would like to make an appoint with ophthalmology will try to do that locally or perhaps here in [place] at [facility].  We discussed his high LDL of 275.  We are waiting for a PA on Praluent.  He is agreeable to try 5 mg of rosuvastatin in the meantime.  I spoke with Dr. Leonette Most about the patient's multiple tendon and joint issues, mildly elevated ESR in the 1:600 ANA.  Dr.Kalish does not believe the patient has a systemic inflammatory illness.  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  Patient has been  able to do his job despite the eye issues which are resolving now.  He will consider making an appointment at the [facility]-19 [facility] haul[facility]" clinic although he already has an appointment in December at the [place] "long haulers" clinic........................................Marland KitchenSherrilee Gilles MD  June 09, 2020 4:45 PM               ORDERS/PROBS/MEDS/ALL     Problems:   ANXIETY DISORDER (ICD-300.00) (ICD10-F41.9)  NECK PAIN (ICD-723.1) (ICD10-M54.2)  ARTHRALGIA (ICD-719.40) (ICD10-M25.50)  FOOT PAIN, LEFT (ICD-729.5) (ICD10-M79.672)  TESTICULAR PAIN, LEFT (ICD-608.9) (ICD10-N50.812)  ANNUAL EXAM (ICD-V72.31) (ICD10-Z00.00)  ELEVATION OF LEVELS OF LIVER TRANSAMINASE LEVELS (ICD10-R74.01)  ABDOMINAL PAIN (ICD-789.00) (ICD10-R10.9)  COVID-19 EVALUATION ENCOUNTER, EXPOSURE RULED OUT (ICD10-Z03.818)  TOE PAIN (ICD-729.5) (ICD10-M79.676)  ODYNOPHAGIA (ICD-787.29) (ICD10-R13.19)  FOOD STICKS ON SWALLOWING (ICD-787.29) (ICD10-R13.10)  CHEST PAIN (ICD-786.50) (ICD10-R07.9)  MYALGIA (ICD-729.1) (ICD10-M79.10)  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)  CERVICAL RADICULOPATHY (ICD-723.4) (ICD10-M54.12)  PAIN, CHEST (ICD-786.50) (ICD10-R07.9)  EXPOSURE TO OTHER HAZARDOUS METALS (ICD-V87.09) (ICD10-Z77.018)  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)  FATIGUE (ICD-780.79) (ICD10-R53.83)  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-N20.0)  UPPER BACK PAIN (ICD-724.5) (ICD10-M54.9)  MALAISE (ICD-780.7) (ICD10-Z87.898)  PLEURITIC CHEST PAIN (ICD-786.52) (ICD10-R07.89)  HEMATURIA, GROSS (ICD-599.7) (ICD10-R31.0)  RENAL MASS (ICD-593.9) (ICD10-N28.89)  ARTHRALGIA (ICD-719.48) (ICD10-M25.50)  CORONARY ARTERY DISEASE (ICD-414.00) (ICD10-I25.10)  HYPERTENSION (ICD-401.9) (ICD10-I10)  URINARY FREQUENCY (ICD-788.41) (ICD10-R35.0)  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-N20.0)  RENAL INSUFFICIENCY (ICD-593.9) (ICD10-N28.9)  HYPERLIPIDEMIA (ICD-272.4) (ICD10-E78.5)  HEMATURIA, MICROSCOPIC (ICD-599.72)  (ICD10-R31.2)  ACROCHORDON (ICD-701.9) (ICD10-L91.8)  OBESITY (ICD-278.01) (ICD10-E66.01)  SNORING (ICD-786.09) (ICD10-R06.83)  IRRITABLE BOWEL SYNDROME (ICD-564.1) (ICD10-K58.9)    Meds (prior to this call):   LISINOPRIL 20 MG ORAL TABLET (LISINOPRIL) Take one tablet by mouth once daily  ROSUVASTATIN CALCIUM 40 MG TAB (ROSUVASTATIN CALCIUM) TAKE 1 TABLET BY MOUTH EVERY DAY  ASPIRIN 81 MG ORAL TABLET (ASPIRIN) Take one tablet by mouth once daily  TAMSULOSIN HCL 0.4 MG ORAL CAPSULE (TAMSULOSIN HCL) Take one capsule by mouth once daily; Route: ORAL  RA SAW PALMETTO 160 MG ORAL CAPSULE (SAW PALMETTO (SERENOA REPENS)) ; Route: ORAL  LORAZEPAM 0.5 MG ORAL TABLET (LORAZEPAM) one by mouth 30 min prior to MRI. May repeat x 1; Route: ORAL  TRIAMCINOLONE ACETONIDE 0.025 % EXTERNAL CREAM (TRIAMCINOLONE ACETONIDE) Apply to affected area bid; Route: EXTERNAL  DONNATAL 16.2 MG/5ML ORAL ELIXIR (PB-HYOSCY-ATROPINE-SCOPOLAMINE) 5-10 ML 2-3 times daiy as neede for abd pain; Route: ORAL  OMEPRAZOLE 20 MG ORAL CAPSULE DELAYED RELEASE (OMEPRAZOLE) Take one capsule by mouth once daily; Route: ORAL  * MRI  LEFT ANKLE Dx: M79.672  LEXAPRO 10 MG ORAL TABLET (ESCITALOPRAM OXALATE) one by mouth daily; Route: ORAL    Changes to Meds (this update):   Removed medication of ROSUVASTATIN CALCIUM 40 MG TAB (ROSUVASTATIN CALCIUM) TAKE 1 TABLET BY MOUTH EVERY DAY - Signed  Added new medication of CRESTOR 5 MG ORAL TABLET (ROSUVASTATIN CALCIUM) one by mouth daily; Route: ORAL - Signed  Cancelled Cancelled Rx of ROSUVASTATIN CALCIUM 40 MG TAB (ROSUVASTATIN CALCIUM) TAKE 1 TABLET BY MOUTH EVERY DAY;  #90[Tablet] x 3;  Signed;  Entered by: [name] MD;  Authorized by: [name] MD;  Method used: Electronically to [facility] - [address]*, [address] [place], [place]  , Ph: [phone], Fax: [phone]  Rx of CRESTOR 5 MG ORAL TABLET (ROSUVASTATIN CALCIUM) one by mouth daily; Route: ORAL  #30 x 5;  Signed;  Entered by: [name] MD;   Authorized by: Wynonia Lawman, MD - [facility];  Method used: Printed then faxed to [facility] - [address]*, [address] [place], [place]  , Ph: [phone], Fax: [phone]; Note to Pharmacy: Route: ORAL;          Created By Wynonia Lawman MD on 06/09/2020 at 04:17 PM    Electronically Signed By Wynonia Lawman MD on 06/18/2020 at 01:38 PM

## 2020-06-07 LAB — BMP (EXT)
Anion Gap (EXT): 8 mmol/L (ref 2–11)
BUN (EXT): 28 mg/dL — ABNORMAL HIGH (ref 8–26)
CO2 (EXT): 21 mmol/L — ABNORMAL LOW (ref 22–29)
CalciumCalcium (EXT): 9.5 mg/dL (ref 8.4–10.3)
Chloride (EXT): 111 mmol/L — ABNORMAL HIGH (ref 98–108)
Creatinine (EXT): 1.5 mg/dL — ABNORMAL HIGH (ref 0.7–1.3)
Glucose (EXT): 103 mg/dL — ABNORMAL HIGH (ref 70.0–100.0)
Potassium (EXT): 4.2 mmol/L (ref 3.5–5.1)
Sodium (EXT): 140 mmol/L (ref 135.0–145.0)
eGFR - Creat MDRD (EXT): 52 mL/min/{1.73_m2}

## 2020-06-13 ENCOUNTER — Ambulatory Visit

## 2020-06-13 NOTE — Telephone Encounter (Signed)
 [facility] reminds you about Colon Cancer Screening [S001]    The following information was sent on 06/13/2020 6:31:16 PM and attached here.       - Secure message created from [facility] template.     - The secure message included no additional attachments.      _____________________________________________________________________    External Attachment:      Type:     Image      Comment:  Message Body    Created By  [employer]Logic on 06/13/2020 at 06:36 PM    Electronically Signed By Ronie Spies, MD on 06/13/2020 at 06:36 PM

## 2020-06-13 NOTE — Discharge Summary (Signed)
With: Address: When:   Arsenio Loader, Physician - Hospitalist [address], [address] [place], [place] [address]  [phone] Business (1) Within 1 week              ED PROVIDER DOCUMENTATION     Patient:   Brian Reese, Brian Reese             MRN: [id number]            FIN: [id number]               Age:   54 years     Sex:  Male     DOB:  02-15-1966   Associated Diagnoses:   Hypertension; History of renal cell carcinoma   Author:   Gordy Councilman S-MD      Basic Information   Time seen: Provider Seen (ST)   ED Provider/Time:    Gordy Councilman S-MD / 06/13/2020 20:35  .   Additional information: Chief Complaint from Nursing Triage Note   Chief Complaint  Chief Complaint: pt c/o bp running higher than normal. reports compliance with all htn medications and denies any cp (06/13/20 20:38:00).     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  Having some nonspecific visual changes with it, went and saw an ophthalmologist yesterday and this was negative work-up.  Also endorse some headache and nonspecific generalized weakness during this time as well.  He had an outpatient MRI that was normal.  States normally his blood pressure is in the 130s systolic.  Takes lisinopril.  Does not have any headache or  focal weakness, numbness, tingling at this time.  Compliant with all medications.  States he took his blood pressure earlier this evening was 170 systolic.  No other symptoms endorsed.  Does not her primary care doctor here in town.      Review of Systems             Additional review of systems information: All other systems reviewed and otherwise negative.      Health Status   Allergies:    Allergic Reactions (All)  Severe  Penicillins- Anaphylactic reaction..      Past Medical/ Family/ Social History   Medical history: Reviewed as documented in chart.   Surgical history: Reviewed as documented in chart.   Family history: Not significant.   Social history: Reviewed as documented in chart.   Problem list:    No qualifying data available  .      Physical Examination               Vital Signs             Time:  06/13/2020 21:07:00.   Vital Signs   06/13/2020 20:51 EDT Systolic Blood Pressure 171 mmHg  HI    Diastolic Blood Pressure 107 mmHg  >HHI    Heart Rate Monitored 93 bpm    Respiratory Rate 22 br/min  HI    SpO2 98 %   06/13/2020 20:38 EDT Systolic Blood Pressure 188 mmHg  >HHI    Diastolic Blood Pressure 117 mmHg  >HHI    Temperature Oral 36.8 degC    Heart Rate Monitored 101 bpm  HI    Respiratory Rate 16 br/min    SpO2 98 %   .   Oxygen  ED Clinical Summary                        [facility]  [address]  [place], [place], [address]  [phone]           PERSON INFORMATION  Name: Brian Reese, Brian Reese Age:  37 Years DOB: [birth year]   Sex: Male Language: English PCP: PCP,  NONE   Marital Status: Married Phone: [phone] Med Service: MED-Medicine   MRN: [id number] Acct# [id number] Arrival: 06/13/2020 20:32:00   Visit Reason: Hypertension; ACUTE HTN Acuity: 3 LOS: 000 01:13   Address:    [address] [place] [address]   Diagnosis:    History of renal cell carcinoma; Hypertension  Medications:          New Medications  Printed Prescriptions  amLODIPine (Norvasc 5 mg oral tablet) 1 Tabs Oral (given by mouth) every day. Refills: 0.  Last Dose:____________________      Medications Administered During Visit:                Medication Dose Route   amlodipine 5 mg Oral               Allergies      penicillins (Anaphylactic reaction)      Major Tests and Procedures:  The following procedures and tests were performed during your ED visit.  COMMON PROCEDURES%>  COMMON PROCEDURES COMMENTS%>                PROVIDER INFORMATION               Provider Role Assigned Corky Sing S-MD ED Provider 06/13/2020 20:35:00    Anastasia Fiedler, RN, [place] Burton ED Nurse 06/13/2020 20:35:42        Attending Physician:  Gordy Councilman S-MD      Admit Doc  Gordy Councilman S-MD     Consulting Doc       VITALS INFORMATION  Vital Sign Triage Latest   Temp Oral ORAL_1%> ORAL%>   Temp Temporal TEMPORAL_1%> TEMPORAL%>   Temp Intravascular INTRAVASCULAR_1%> INTRAVASCULAR%>   Temp Axillary AXILLARY_1%> AXILLARY%>   Temp Rectal RECTAL_1%> RECTAL%>   02 Sat 98 % 96 %   Respiratory Rate RATE_1%> RATE%>   Peripheral Pulse Rate PULSE RATE_1%>93 bpm PULSE RATE%>   Apical Heart Rate HEART RATE_1%> HEART RATE%>   Blood Pressure BLOOD PRESSURE_1%>/ BLOOD PRESSURE_1%>117 mmHg BLOOD PRESSURE%> / BLOOD PRESSURE%>98 mmHg                 Immunizations      No Immunizations  Documented This Visit          DISCHARGE INFORMATION   Discharge Disposition: H Outpt-Sent Home   Discharge Location:  Home   Discharge Date and Time:  06/13/2020 21:45:00   ED Checkout Date and Time:  06/13/2020 21:45:00     DEPART REASON INCOMPLETE INFORMATION               Depart Action Incomplete Reason   Interactive View/I&O Recently assessed               Problems      No Problems Documented              Smoking Status      No Smoking Status Documented         PATIENT EDUCATION INFORMATION  Instructions:     Hypertension     Follow up:  With: Address: When:   Arsenio Loader, Physician - Hospitalist [address], [address] [place], [place] [address]  [phone] Business (1) Within 1 week              ED PROVIDER DOCUMENTATION     Patient:   Brian Reese, Brian Reese             MRN: [id number]            FIN: [id number]               Age:   54 years     Sex:  Male     DOB:  02-15-1966   Associated Diagnoses:   Hypertension; History of renal cell carcinoma   Author:   Gordy Councilman S-MD      Basic Information   Time seen: Provider Seen (ST)   ED Provider/Time:    Gordy Councilman S-MD / 06/13/2020 20:35  .   Additional information: Chief Complaint from Nursing Triage Note   Chief Complaint  Chief Complaint: pt c/o bp running higher than normal. reports compliance with all htn medications and denies any cp (06/13/20 20:38:00).     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  Having some nonspecific visual changes with it, went and saw an ophthalmologist yesterday and this was negative work-up.  Also endorse some headache and nonspecific generalized weakness during this time as well.  He had an outpatient MRI that was normal.  States normally his blood pressure is in the 130s systolic.  Takes lisinopril.  Does not have any headache or  focal weakness, numbness, tingling at this time.  Compliant with all medications.  States he took his blood pressure earlier this evening was 170 systolic.  No other symptoms endorsed.  Does not her primary care doctor here in town.      Review of Systems             Additional review of systems information: All other systems reviewed and otherwise negative.      Health Status   Allergies:    Allergic Reactions (All)  Severe  Penicillins- Anaphylactic reaction..      Past Medical/ Family/ Social History   Medical history: Reviewed as documented in chart.   Surgical history: Reviewed as documented in chart.   Family history: Not significant.   Social history: Reviewed as documented in chart.   Problem list:    No qualifying data available  .      Physical Examination               Vital Signs             Time:  06/13/2020 21:07:00.   Vital Signs   06/13/2020 20:51 EDT Systolic Blood Pressure 171 mmHg  HI    Diastolic Blood Pressure 107 mmHg  >HHI    Heart Rate Monitored 93 bpm    Respiratory Rate 22 br/min  HI    SpO2 98 %   06/13/2020 20:38 EDT Systolic Blood Pressure 188 mmHg  >HHI    Diastolic Blood Pressure 117 mmHg  >HHI    Temperature Oral 36.8 degC    Heart Rate Monitored 101 bpm  HI    Respiratory Rate 16 br/min    SpO2 98 %   .   Oxygen  ED Clinical Summary                        [facility]  [address]  [place], [place], [address]  [phone]           PERSON INFORMATION  Name: Brian Reese, Brian Reese Age:  37 Years DOB: [birth year]   Sex: Male Language: English PCP: PCP,  NONE   Marital Status: Married Phone: [phone] Med Service: MED-Medicine   MRN: [id number] Acct# [id number] Arrival: 06/13/2020 20:32:00   Visit Reason: Hypertension; ACUTE HTN Acuity: 3 LOS: 000 01:13   Address:    [address] [place] [address]   Diagnosis:    History of renal cell carcinoma; Hypertension  Medications:          New Medications  Printed Prescriptions  amLODIPine (Norvasc 5 mg oral tablet) 1 Tabs Oral (given by mouth) every day. Refills: 0.  Last Dose:____________________      Medications Administered During Visit:                Medication Dose Route   amlodipine 5 mg Oral               Allergies      penicillins (Anaphylactic reaction)      Major Tests and Procedures:  The following procedures and tests were performed during your ED visit.  COMMON PROCEDURES%>  COMMON PROCEDURES COMMENTS%>                PROVIDER INFORMATION               Provider Role Assigned Corky Sing S-MD ED Provider 06/13/2020 20:35:00    Anastasia Fiedler, RN, [place] Burton ED Nurse 06/13/2020 20:35:42        Attending Physician:  Gordy Councilman S-MD      Admit Doc  Gordy Councilman S-MD     Consulting Doc       VITALS INFORMATION  Vital Sign Triage Latest   Temp Oral ORAL_1%> ORAL%>   Temp Temporal TEMPORAL_1%> TEMPORAL%>   Temp Intravascular INTRAVASCULAR_1%> INTRAVASCULAR%>   Temp Axillary AXILLARY_1%> AXILLARY%>   Temp Rectal RECTAL_1%> RECTAL%>   02 Sat 98 % 96 %   Respiratory Rate RATE_1%> RATE%>   Peripheral Pulse Rate PULSE RATE_1%>93 bpm PULSE RATE%>   Apical Heart Rate HEART RATE_1%> HEART RATE%>   Blood Pressure BLOOD PRESSURE_1%>/ BLOOD PRESSURE_1%>117 mmHg BLOOD PRESSURE%> / BLOOD PRESSURE%>98 mmHg                 Immunizations      No Immunizations  Documented This Visit          DISCHARGE INFORMATION   Discharge Disposition: H Outpt-Sent Home   Discharge Location:  Home   Discharge Date and Time:  06/13/2020 21:45:00   ED Checkout Date and Time:  06/13/2020 21:45:00     DEPART REASON INCOMPLETE INFORMATION               Depart Action Incomplete Reason   Interactive View/I&O Recently assessed               Problems      No Problems Documented              Smoking Status      No Smoking Status Documented         PATIENT EDUCATION INFORMATION  Instructions:     Hypertension     Follow up:

## 2020-06-13 NOTE — ED Notes (Signed)
ED Triage Note       ED Triage Adult Entered On:  06/13/2020 20:40 EDT    Performed On:  06/13/2020 20:38 EDT by Anastasia Fiedler, RN, Emily               Triage   Numeric Rating Pain Scale :   0 = No pain   Chief Complaint :   pt c/o bp running higher than normal. reports compliance with all htn medications and denies any cp   [place] Mode of Arrival :   Walking   Infectious Disease Documentation :   Document assessment   Temperature Oral :   36.8 degC(Converted to: 98.2 degF)    Heart Rate Monitored :   101 bpm (HI)    Respiratory Rate :   16 br/min   Systolic Blood Pressure :   188 mmHg (>HHI)    Diastolic Blood Pressure :   117 mmHg (>HHI)    Patient presentation :   None of the above   Chief Complaint or Presentation suggest infection :   No   Weight Dosing :   82.4 kg(Converted to: 181 lb 11 oz)    Height :   175 cm(Converted to: 5 ft 9 in)    Body Mass Index Dosing :   27 kg/m2   Delrae Rend - 06/13/2020 20:38 EDT   DCP GENERIC CODE   Tracking Acuity :   3   Tracking Group :   ED [employer]   [place], RNIrving Burton - 06/13/2020 20:38 EDT   ED General Section :   Document assessment   Pregnancy Status :   N/A   ED Allergies Section :   Document assessment   ED Reason for Visit Section :   Document assessment   Delrae Rend - 06/13/2020 20:38 EDT   ID Risk Screen Symptoms   Recent Travel History :   No recent travel   Close Contact with COVID-19 ID :   No   Last 14 days COVID-19 ID :   No   TB Symptom Screen :   No symptoms   C. diff Symptom/History ID :   Neither of the above   [place], RNIrving Burton - 06/13/2020 20:38 EDT   Allergies   (As Of: 06/13/2020 20:40:52 EDT)   Allergies (Active)   penicillins  Estimated Onset Date:   Unspecified ; Reactions:   Anaphylactic reaction ; Created By:   Delrae Rend; Reaction Status:   Active ; Category:   Drug ; Substance:   penicillins ; Type:   Allergy ; Severity:   Severe ; Updated By:   Delrae Rend; Reviewed Date:   06/13/2020 20:39 EDT        Psycho-Social   Last 3  mo, thoughts killing self/others :   Patient denies   Right click within box for Suspected Abuse policy link. :   None   Feels Safe Where Live :   Yes   Delrae Rend - 06/13/2020 20:38 EDT   ED Reason for Visit   (As Of: 06/13/2020 20:40:52 EDT)   Diagnoses(Active)    Hypertension  Date:   06/13/2020 ; Diagnosis Type:   Reason For Visit ; Confirmation:   Complaint of ; Clinical Dx:   Hypertension ; Classification:   Medical ; Clinical Service:   Non-Specified ; Code:   PNED ; Probability:   0 ; Diagnosis Code:   [id number]

## 2020-06-13 NOTE — ED Provider Notes (Signed)
General Medical Problem *ED        Patient:   Brian Reese, Brian Reese             MRN: [id number]            FIN: [id number]               Age:   54 years     Sex:  Male     DOB:  November 22, 1965   Associated Diagnoses:   Hypertension; History of renal cell carcinoma   Author:   Milon Dikes S-MD      Basic Information   Time seen: Provider Seen (ST)   ED Provider/Time:    Milon Dikes S-MD / 06/13/2020 20:35  .   Additional information: Chief Complaint from Nursing Triage Note   Chief Complaint  Chief Complaint: pt c/o bp running higher than normal. reports compliance with all htn medications and denies any cp (06/13/20 20:38:00).     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  Having some nonspecific visual changes with it, went and saw an ophthalmologist yesterday and this was negative work-up.  Also endorse some headache and nonspecific generalized weakness during this time as well.  He had an outpatient MRI that was normal.  States normally his blood pressure is in the 622W systolic.  Takes lisinopril.  Does not have any headache or focal weakness, numbness, tingling at this time.  Compliant with all medications.  States he took his blood pressure earlier this evening was 979 systolic.  No other symptoms endorsed.  Does not her primary care doctor here in town.      Review of Systems             Additional review of systems information: All other systems reviewed and otherwise negative.      Health Status   Allergies:    Allergic Reactions (All)  Severe  Penicillins- Anaphylactic reaction..      Past Medical/ Family/ Social History   Medical history: Reviewed as documented in chart.   Surgical history: Reviewed as documented in chart.   Family history: Not significant.   Social history: Reviewed as documented in chart.   Problem list:    No  qualifying data available  .      Physical Examination               Vital Signs             Time:  06/13/2020 21:07:00.   Vital Signs   89/10/1192 17:40 EDT Systolic Blood Pressure 814 mmHg  HI    Diastolic Blood Pressure 481 mmHg  >HHI    Heart Rate Monitored 93 bpm    Respiratory Rate 22 br/min  HI    SpO2 98 %   85/02/3148 70:26 EDT Systolic Blood Pressure 378 mmHg  >HHI    Diastolic Blood Pressure 588 mmHg  >HHI    Temperature Oral 36.8 degC    Heart Rate Monitored 101 bpm  HI    Respiratory Rate 16 br/min    SpO2 98 %   .   Oxygen saturation.   General:  Alert, no acute distress.    Skin:  Warm, dry, pink.    Head:  Normocephalic.   Neck:  Supple, trachea midline.    Eye:  Pupils are equal, round and reactive to light, extraocular movements are intact.    Ears, nose, mouth and throat:  Oral mucosa moist.   Cardiovascular:  Regular rate and rhythm, Normal peripheral perfusion.    Respiratory:  Lungs are clear to auscultation, respirations are non-labored.    Musculoskeletal:  Normal ROM, no tenderness.    Chest wall   Gastrointestinal:  Soft, Nontender, Non distended.    Neurological:  Alert and oriented to person, place, time, and situation, No focal neurological deficit observed, normal sensory observed, normal motor observed.    Lymphatics   Psychiatric:  Cooperative, appropriate mood & affect.       Medical Decision Making   Rationale:   54 year old male presents for concerns of elevated blood pressure, visual disturbances intermittently.  Has a history of renal cell carcinoma, he has had a negative MRI recently and is seen an ophthalmologist at the [place] and this is negative.  States he has been having some elevated blood pressure that has been trending in the 170s.  170/100 here.  Neurovascularly intact without any chest pain or shortness of breath.  EKG unremarkable.  Enzymes are normal.  His kidney function here is 1.3.  This is better than his baseline of 1.5 it sounds like.  I do think that  he would warrant some benefit for being placed on Norvasc.  We will get him to follow-up with a PCP. Marland Kitchen   Documents reviewed:  Emergency department nurses' notes, flowsheet, emergency department records, prior records, vital signs.    Electrocardiogram:  Emergency Provider interpretation performed by me, time 06/13/2020 21:37:00, rate 98, normal sinus rhythm, No ST-T changes, no ectopy, normal PR & QRS intervals.    Results review:  Lab results : Lab View   06/13/2020 21:21 EDT Estimated Creatinine Clearance 65.50 mL/min   06/13/2020 20:57 EDT WBC 10.4 x10e3/mcL    RBC 4.99 x10e6/mcL    Hgb 15.0 g/dL    HCT 44.2 %    MCV 88.6 fL    MCH 30.1 pg    MCHC 33.9 g/dL    RDW 12.6 %    Platelet 225 x10e3/mcL    MPV 10.6 fL    Neutro Auto 58.8 %    Neutro Absolute 6.1 x10e3/mcL    Immature Grans Percent 0.2 %    Immature Grans Absolute 0.02 x10e3/mcL    Lymph Auto 28.7 %    Lymph Absolute 3.0 x10e3/mcL    Mono Auto 10.1 %    Mono Absolute 1.1 x10e3/mcL  HI    Eosinophil Percent 1.8 %    Eos Absolute 0.2 x10e3/mcL    Basophil Auto 0.4 %    Baso Absolute 0.0 x10e3/mcL    NRBC Absolute Auto 0.000 x10e3/mcL    NRBC Percent Auto 0.0 %    Sodium Lvl 137 mmol/L    Potassium Lvl 4.4 mmol/L    Chloride 101 mmol/L    CO2 24 mmol/L    Glucose Random 94 mg/dL    BUN 26 mg/dL  HI    Creatinine Lvl 1.3 mg/dL    AGAP 12 mmol/L    Osmolality Calc 278 mOsm/kg    Calcium Lvl 9.7 mg/dL    eGFR AA 72 mL/min/1.57m???  LOW    eGFR Non-AA 62 mL/min/1.728m??  LOW    Trop T Quant <0.010 ng/mL   .      Impression and Plan   Diagnosis   Hypertension (ICD10-CM I10, Discharge, Medical)   History of renal cell  carcinoma (ICD10-CM Z85.528, Discharge, Medical)   Plan   Condition: Improved.    Disposition: Medically cleared, Discharged: Time  06/13/2020 21:35:00, to home.    Prescriptions: [facility] prescriptions   Pharmacy:  Norvasc 5 mg oral tablet (Prescribe): 5 mg, 1 tabs, Oral, Daily, 30 tabs, 0 Refill(s).    Patient was given the following educational  materials: Hypertension.    Follow up with: Fredric Dine, Physician - Hospitalist Within 1 week.    Counseled: I had a detailed discussion with the patient and/or guardian regarding the historical points/exam findings supporting the discharge diagnosis and need for outpatient followup. Discussed the need to return to the ER if symptoms persist/worsen, or for any questions/concerns that arise at home.    Signature Line     Electronically Signed on 06/13/2020 09:52 PM EDT   ________________________________________________   Milon Dikes S-MD               Modified by: Milon Dikes S-MD on 06/13/2020 09:08 PM EDT      Modified by: Milon Dikes S-MD on 06/13/2020 09:39 PM EDT

## 2020-06-13 NOTE — ED Notes (Signed)
ED Triage Note       ED Secondary Triage Entered On:  06/13/2020 20:41 EDT    Performed On:  06/13/2020 20:41 EDT by Anastasia Fiedler, RN, [place]               General Information   Barriers to Learning :   None evident   ED Home Meds Section :   Document assessment   [facility] ED Fall Risk Section :   Document assessment   ED Advance Directives Section :   Document assessment   ED Palliative Screen :   N/A (prefilled for <53yo)   Anastasia Fiedler RNIrving Burton - 06/13/2020 20:41 EDT   (As Of: 06/13/2020 20:41:26 EDT)   Diagnoses(Active)    Hypertension  Date:   06/13/2020 ; Diagnosis Type:   Reason For Visit ; Confirmation:   Complaint of ; Clinical Dx:   Hypertension ; Classification:   Medical ; Clinical Service:   Non-Specified ; Code:   PNED ; Probability:   0 ; Diagnosis Code:   [id number]             -    Procedure History   (As Of: 06/13/2020 20:41:26 EDT)     [facility]ch Fall Risk Assessment Tool   Hx of falling last 3 months ED Fall :   No   Patient confused or disoriented ED Fall :   No   Patient intoxicated or sedated ED Fall :   No   Patient impaired gait ED Fall :   No   Use a mobility assistance device ED Fall :   No   Patient altered elimination ED Fall :   No   [facility] ED Fall Score :   0    Delrae Rend - 06/13/2020 20:41 EDT   ED Advance Directive   Advance Directive :   No   Anastasia Fiedler RNIrving Burton - 06/13/2020 20:41 EDT   Med Hx   Medication List   (As Of: 06/13/2020 20:41:26 EDT)

## 2020-06-13 NOTE — ED Notes (Signed)
ED Patient Summary       ;       [facility]  [address] [place] [address]  [phone]  Discharge Instructions (Patient)  Name: Brian Reese, Brian Reese  DOB:  September 11, 1965                   MRN: [id number]                   FIN: [id number]  Reason For Visit: Hypertension; ACUTE HTN  Final Diagnosis: History of renal cell carcinoma; Hypertension     Visit Date: 06/13/2020 20:32:00  Address: [address] [facility] [address]  Phone: [phone]     Emergency Department Providers:         Primary Physician:      Gordy Council[facility] would like to thank you for allowing [place] to assist you with your healthcare needs. The following includes patient education materials and information regarding your injury/illness.     Follow-up Instructions:  You were seen today on an emergency basis. Please contact your primary care doctor for a follow up appointment. If you received a referral to a specialist doctor, it is important you follow-up as instructed.    It is important that you call your follow-up doctor to schedule and confirm the location of your next appointment. Your doctor may practice at multiple locations. The office location of your follow-up appointment may be different to the one written on your discharge instructions.    If you do not have a primary care doctor, please call [phone] for help in finding a [facility] Provider. For help in finding a specialist doctor, please call [phone].    If your condition gets worse before your follow-up with your primary care doctor or specialist, please return to the Emergency Department.      Coronavirus 2019 (COVID-19) Reminders:     Patients age 68 - 15, with parental consent, and patients over age 59 can make an appointment for a COVID-19 vaccine. Patients can contact their [facility] doctors' offices to schedule an appointment to receive the COVID-19 vaccine.  Patients who do not have a [facility] physician can call [phone] to schedule vaccination appointments.      Follow Up Appointments:  Primary Care Provider:     Name: PCP,  NONE     Phone:                  With: Address: When:   Arsenio Loader, Physician - Hospitalist [address] [place], [place] [address]  [phone] Business (1) Within 1 week                   New Medications  Printed Prescriptions  amLODIPine (Norvasc 5 mg oral tablet) 1 Tabs Oral (given by mouth) every day. Refills: 0.  Last Dose:____________________      Allergy Info: penicillins     Discharge Additional Information          Discharge Patient 06/13/20 21:37:00 EDT      Patient Education Materials:        Hypertension    Hypertension, commonly called high blood pressure, is when the force of blood pumping through your arteries is too strong. Your arteries are the blood vessels that carry blood from your heart throughout your body. A blood pressure reading consists  of a higher number over a lower number, such as 110/72. The higher number (systolic) is the pressure inside your arteries when your heart pumps. The lower number (diastolic) is the pressure inside your arteries when your heart relaxes. Ideally you want your blood pressure below 120/80.    Hypertension forces your heart to work harder to pump blood. Your arteries may become narrow or stiff. Having untreated or uncontrolled hypertension can cause heart attack, stroke, kidney disease, and other problems.      RISK FACTORS    Some risk factors for high blood pressure are controllable. Others are not.     Risk factors you cannot control include:      Race. You may be at higher risk if you are African American.     Age. Risk increases with age.      Gender. Men are at higher risk than women before age 62 years. After age 41, women are at higher risk than men.    Risk factors you can control include:     Not getting enough exercise or physical activity.     Being  overweight.     Getting too much fat, sugar, calories, or salt in your diet.     Drinking too much alcohol.    SIGNS AND SYMPTOMS    Hypertension does not usually cause signs or symptoms. Extremely high blood pressure (hypertensive crisis) may cause headache, anxiety, shortness of breath, and nosebleed.    DIAGNOSIS    To check if you have hypertension, your health care provider will measure your blood pressure while you are seated, with your arm held at the level of your heart. It should be measured at least twice using the same arm. Certain conditions can cause a difference in blood pressure between your right and left arms. A blood pressure reading that is higher than normal on one occasion does not mean that you need treatment. If it is not clear whether you have high blood pressure, you may be asked to return on a different day to have your blood pressure checked again. Or, you may be asked to monitor your blood pressure at home for 1 or more weeks.    TREATMENT    Treating high blood pressure includes making lifestyle changes and possibly taking medicine. Living a healthy lifestyle can help lower high blood pressure. You may need to change some of your habits.    Lifestyle changes may include:     Following the DASH diet. This diet is high in fruits, vegetables, and whole grains. It is low in salt, red meat, and added sugars.     Keep your sodium intake below 2,300 mg per day.     Getting at least 30?45 minutes of aerobic exercise at least 4 times per week.     Losing weight if necessary.     Not smoking.     Limiting alcoholic beverages.     Learning ways to reduce stress.    Your health care provider may prescribe medicine if lifestyle changes are not enough to get your blood pressure under control, and if one of the following is true:     You are 83?53 years of age and your systolic blood pressure is above 140.     You are 56 years of age or older, and your systolic blood pressure is above 150.     Your  diastolic blood pressure is above 90.     You have diabetes, and your systolic  blood pressure is over 140 or your diastolic blood pressure is over 90.     You have kidney disease and your blood pressure is above 140/90.     You have heart disease and your blood pressure is above 140/90.    Your personal target blood pressure may vary depending on your medical conditions, your age, and other factors.    HOME CARE INSTRUCTIONS     Have your blood pressure rechecked as directed by your health care provider. ?     Take medicines only as directed by your health care provider. Follow the directions carefully. Blood pressure medicines must be taken as prescribed. The medicine does not work as well when you skip doses. Skipping doses also puts you at risk for problems.      Do not smoke. ?     Monitor your blood pressure at home as directed by your health care provider.?    SEEK MEDICAL CARE IF:     You think you are having a reaction to medicines taken.     You have recurrent headaches or feel dizzy.     You have swelling in your ankles.     You have trouble with your vision.    SEEK IMMEDIATE MEDICAL CARE IF:     You develop a severe headache or confusion.     You have unusual weakness, numbness, or feel faint.     You have severe chest or abdominal pain.     You vomit repeatedly.     You have trouble breathing.    MAKE SURE YOU:     Understand these instructions.      Will watch your condition.     Will get help right away if you are not doing well or get worse.    This information is not intended to replace advice given to you by your health care provider. Make sure you discuss any questions you have with your health care provider.    Document Released: 08/23/2005 Document Revised: 01/07/2015 Document Reviewed: 06/15/2013  Elsevier Interactive Patient Education ?2016 Elsevier Inc.      ---------------------------------------------------------------------------------------------------------------------  [facility]) encourages you to self-enroll in the [facility] Patient Portal.  [facility] Patient Portal will allow you to manage your personal health information securely from your own electronic device now and in the future.  To begin your Patient Portal enrollment process, please visit [email or web address] Click on "Sign up now" under [facility].  If you find that you need additional assistance on the [facility] Patient Portal or need a copy of your medical records, please call the [facility] Medical Records Office at [phone].  Comment:

## 2020-06-13 NOTE — ED Notes (Signed)
ED Patient Education Note     Patient Education Materials Follows:  Preventive Health     Hypertension    Hypertension, commonly called high blood pressure, is when the force of blood pumping through your arteries is too strong. Your arteries are the blood vessels that carry blood from your heart throughout your body. A blood pressure reading consists of a higher number over a lower number, such as 110/72. The higher number (systolic) is the pressure inside your arteries when your heart pumps. The lower number (diastolic) is the pressure inside your arteries when your heart relaxes. Ideally you want your blood pressure below 120/80.    Hypertension forces your heart to work harder to pump blood. Your arteries may become narrow or stiff. Having untreated or uncontrolled hypertension can cause heart attack, stroke, kidney disease, and other problems.      RISK FACTORS    Some risk factors for high blood pressure are controllable. Others are not.     Risk factors you cannot control include:      Race. You may be at higher risk if you are African American.     Age. Risk increases with age.      Gender. Men are at higher risk than women before age 45 years. After age 65, women are at higher risk than men.    Risk factors you can control include:     Not getting enough exercise or physical activity.     Being overweight.     Getting too much fat, sugar, calories, or salt in your diet.     Drinking too much alcohol.    SIGNS AND SYMPTOMS    Hypertension does not usually cause signs or symptoms. Extremely high blood pressure (hypertensive crisis) may cause headache, anxiety, shortness of breath, and nosebleed.    DIAGNOSIS    To check if you have hypertension, your health care provider will measure your blood pressure while you are seated, with your arm held at the level of your heart. It should be measured at least twice using the same arm. Certain conditions can cause a difference in blood pressure between your right and  left arms. A blood pressure reading that is higher than normal on one occasion does not mean that you need treatment. If it is not clear whether you have high blood pressure, you may be asked to return on a different day to have your blood pressure checked again. Or, you may be asked to monitor your blood pressure at home for 1 or more weeks.    TREATMENT    Treating high blood pressure includes making lifestyle changes and possibly taking medicine. Living a healthy lifestyle can help lower high blood pressure. You may need to change some of your habits.    Lifestyle changes may include:     Following the DASH diet. This diet is high in fruits, vegetables, and whole grains. It is low in salt, red meat, and added sugars.     Keep your sodium intake below 2,300 mg per day.     Getting at least 30?45 minutes of aerobic exercise at least 4 times per week.     Losing weight if necessary.     Not smoking.     Limiting alcoholic beverages.     Learning ways to reduce stress.    Your health care provider may prescribe medicine if lifestyle changes are not enough to get your blood pressure under control, and if one of the following   is true:     You are 18?53 years of age and your systolic blood pressure is above 140.     You are 60 years of age or older, and your systolic blood pressure is above 150.     Your diastolic blood pressure is above 90.     You have diabetes, and your systolic blood pressure is over 140 or your diastolic blood pressure is over 90.     You have kidney disease and your blood pressure is above 140/90.     You have heart disease and your blood pressure is above 140/90.    Your personal target blood pressure may vary depending on your medical conditions, your age, and other factors.    HOME CARE INSTRUCTIONS     Have your blood pressure rechecked as directed by your health care provider. ?     Take medicines only as directed by your health care provider. Follow the directions carefully. Blood pressure  medicines must be taken as prescribed. The medicine does not work as well when you skip doses. Skipping doses also puts you at risk for problems.      Do not smoke. ?     Monitor your blood pressure at home as directed by your health care provider.?    SEEK MEDICAL CARE IF:     You think you are having a reaction to medicines taken.     You have recurrent headaches or feel dizzy.     You have swelling in your ankles.     You have trouble with your vision.    SEEK IMMEDIATE MEDICAL CARE IF:     You develop a severe headache or confusion.     You have unusual weakness, numbness, or feel faint.     You have severe chest or abdominal pain.     You vomit repeatedly.     You have trouble breathing.    MAKE SURE YOU:     Understand these instructions.      Will watch your condition.     Will get help right away if you are not doing well or get worse.    This information is not intended to replace advice given to you by your health care provider. Make sure you discuss any questions you have with your health care provider.    Document Released: 08/23/2005 Document Revised: 01/07/2015 Document Reviewed: 06/15/2013  Elsevier Interactive Patient Education ?2016 Elsevier Inc.

## 2020-06-14 LAB — BASIC METABOLIC PANEL
Anion Gap: 12 mmol/L (ref 2–17)
BUN: 26 mg/dL — ABNORMAL HIGH (ref 6–20)
CO2: 24 mmol/L (ref 22–29)
Calcium: 9.7 mg/dL (ref 8.6–10.0)
Chloride: 101 mmol/L (ref 98–107)
Creatinine: 1.3 mg/dL (ref 0.7–1.3)
GFR African American: 72 mL/min/{1.73_m2} — ABNORMAL LOW (ref >=90)
GFR Non-African American: 62 mL/min/{1.73_m2} — ABNORMAL LOW (ref >=90)
Glucose: 94 mg/dL (ref 70–99)
OSMOLALITY CALCULATED: 278 mOsm/kg (ref 270–287)
Potassium: 4.4 mmol/L (ref 3.5–5.3)
Sodium: 137 mmol/L (ref 135–145)

## 2020-06-14 LAB — TROPONIN T: Troponin T: <0.01 ng/mL (ref 0.000–0.010)

## 2020-06-14 LAB — CBC WITH AUTO DIFFERENTIAL
Absolute Baso #: 0 10*3/uL (ref 0.0–0.2)
Absolute Eos #: 0.2 10*3/uL (ref 0.0–0.5)
Absolute Lymph #: 3 10*3/uL (ref 1.0–3.2)
Absolute Mono #: 1.1 10*3/uL — ABNORMAL HIGH (ref 0.3–1.0)
Basophils %: 0.4 % (ref 0.0–2.0)
Eosinophils %: 1.8 % (ref 0.0–7.0)
Hematocrit: 44.2 % (ref 38.0–52.0)
Hemoglobin: 15 g/dL (ref 13.0–17.3)
Immature Grans (Abs): 0.02 10*3/uL (ref 0.00–0.06)
Immature Granulocytes: 0.2 % (ref 0.1–0.6)
Lymphocytes: 28.7 % (ref 15.0–45.0)
MCH: 30.1 pg (ref 27.0–34.5)
MCHC: 33.9 g/dL (ref 32.0–36.0)
MCV: 88.6 fL (ref 84.0–100.0)
MPV: 10.6 fL (ref 7.2–13.2)
Monocytes: 10.1 % (ref 4.0–12.0)
NRBC Absolute: 0 10*3/uL (ref 0.000–0.012)
NRBC Automated: 0 % (ref 0.0–0.2)
Neutrophils %: 58.8 % (ref 42.0–74.0)
Neutrophils Absolute: 6.1 10*3/uL (ref 1.6–7.3)
Platelets: 225 10*3/uL (ref 140–440)
RBC: 4.99 x10e6/mcL (ref 4.00–5.60)
RDW: 12.6 % (ref 11.0–16.0)
WBC: 10.4 10*3/uL (ref 3.8–10.6)

## 2020-06-16 NOTE — ED Notes (Signed)
ED Pre-Arrival Note        Pre-Arrival Summary    Name:  CC 23,    Current Date:  06/16/2020 21:50:09 EDT  Gender:  Male  Date of Birth:  Mar 11, 1966  Age:  54 years  Pre-Arrival Type:  EMS  ETA:  06/16/2020 22:08:00 EDT  Primary Care Physician:    Presenting Problem:  CP  Pre-Arrival User:    Referring Source:    Location:  [facility]  BP:  182/108  HR:  120  EKG:  SR/ST            PreArrival Communication Form  Emergency Department        Additional Patient Information:        Orders:  [    ] CBC                                            [     ] CT Head no contrast  [    ] BMP                                           [     ] CT Abdomen/Pelvis no contrast  [    ] PT/INR                                       [     ] CT Abdomen/Pelvis IV contrast, w/ oral contrast  [    ] Troponin                                   [     ] CT Abdomen/Pelvis IV contrast, no oral contrast  [    ] BNP                                            [     ] See ordersheet  [    ] CXR                                             [     ] Other:__________________________  [    ] EKG

## 2020-06-16 NOTE — ED Notes (Signed)
ED Triage Note       ED Triage Adult Entered On:  06/16/2020 21:55 EDT    Performed On:  06/16/2020 21:50 EDT by Dawson Bills P-RN               Triage   Numeric Rating Pain Scale :   0 = No pain   Chief Complaint :   pt arrives via EMS, c/o of jaw numbness/nausea, pt has cardiac hx, denies any Chest pain, weakness began 40 min ago, pt was recently seen here 4 days ago for anxiety, notes it feels different, takes crestor 5mg    Chief Complaint Onset :   06/16/2020 21:13 EDT   [place] Mode of Arrival :   Ambulance   Infectious Disease Documentation :   Document assessment   Temperature Oral :   36.6 degC(Converted to: 97.9 degF)    Heart Rate Monitored :   106 bpm (HI)    Respiratory Rate :   26 br/min (HI)    Systolic Blood Pressure :   168 mmHg (HI)    Diastolic Blood Pressure :   98 mmHg (HI)    SpO2 :   99 %   Oxygen Therapy :   Room air   Patient presentation :   RR > 22, None of the above   Chief Complaint or Presentation suggest infection :   No   Dosing Weight Obtained By :   Measured   Weight Dosing :   81.8 kg(Converted to: 180 lb 5 oz)    Height :   176 cm(Converted to: 5 ft 9 in)    Body Mass Index Dosing :   26 kg/m2   Dawson Bills P-RN - 06/16/2020 21:50 EDT   DCP GENERIC CODE   Tracking Group :   ED [facility]acking [facility]   Tracking Acuity :   3   Dawson Bills P-RN - 06/16/2020 21:50 EDT   ED General Section :   Document assessment   Pregnancy Status :   N/A   ED Allergies Section :   Document assessment   ED Reason for Visit Section :   Document assessment   ED Home Meds Section :   Document assessment   ED Quick Assessment :   Patient appears awake, alert, oriented to baseline. Skin warm and dry. Moves all extremities. Respiration even and unlabored. Appears in no apparent distress.   Dawson Bills P-RN - 06/16/2020 21:50 EDT   PTA/Triage Treatments   ED PTA Pre-Arrival Service :   [facility]   Dawson Bills P-RN - 06/16/2020 21:50 EDT   ID Risk Screen Symptoms   Recent  Travel History :   No recent travel   Close Contact with COVID-19 ID :   No   Last 14 days COVID-19 ID :   No   Dawson Bills P-RN - 06/16/2020 21:50 EDT   Allergies   (As Of: 06/16/2020 21:56:00 EDT)   Allergies (Active)   penicillins  Estimated Onset Date:   Unspecified ; Reactions:   Anaphylactic reaction ; Created By:   Delrae Rend; Reaction Status:   Active ; Category:   Drug ; Substance:   penicillins ; Type:   Allergy ; Severity:   Severe ; Updated By:   Delrae Rend; Reviewed Date:   06/13/2020 21:06 EDT        Psycho-Social   Last 3 mo, thoughts killing self/others :   Patient  denies   Right click within box for Suspected Abuse policy link. :   None   Dawson Bills P-RN - 06/16/2020 21:50 EDT   Feels Safe Where Live :   Yes   Dawson Bills P-RN - 06/16/2020 21:57 EDT     ED Home Med List   Medication List   (As Of: 06/16/2020 21:56:00 EDT)   Prescription/Discharge Order    amLODIPine  :   amLODIPine ; Status:   Prescribed ; Ordered As Mnemonic:   Norvasc 5 mg oral tablet ; Simple Display Line:   5 mg, 1 tabs, Oral, Daily, 30 tabs, 0 Refill(s) ; Ordering Provider:   Gordy Councilman S-MD; Catalog Code:   amLODIPine ; Order Dt/Tm:   06/13/2020 21:36:14 EDT            ED Reason for Visit   (As Of: 06/16/2020 21:56:00 EDT)   Diagnoses(Active)    Chest pain  Date:   06/16/2020 ; Diagnosis Type:   Reason For Visit ; Confirmation:   Complaint of ; Clinical Dx:   Chest pain ; Classification:   Medical ; Clinical Service:   Emergency medicine ; Code:   PNED ; Probability:   0 ; Diagnosis Code:   [id number]

## 2020-06-16 NOTE — ED Notes (Signed)
ED Triage Note       ED Secondary Triage Entered On:  06/16/2020 21:56 EDT    Performed On:  06/16/2020 21:56 EDT by Dawson Bills P-RN               General Information   Barriers to Learning :   None evident   COVID-19 Vaccine Status :   2 Doses received   ED Home Meds Section :   Document assessment   [facility] ED Fall Risk Section :   Document assessment   ED Advance Directives Section :   Document assessment   ED Palliative Screen :   N/A (prefilled for <65yo)   Dawson Bills P-RN - 06/16/2020 21:56 EDT   (As Of: 06/16/2020 21:56:16 EDT)   Diagnoses(Active)    Chest pain  Date:   06/16/2020 ; Diagnosis Type:   Reason For Visit ; Confirmation:   Complaint of ; Clinical Dx:   Chest pain ; Classification:   Medical ; Clinical Service:   Emergency medicine ; Code:   PNED ; Probability:   0 ; Diagnosis Code:   [id number]             -    Procedure History   (As Of: 06/16/2020 21:56:16 EDT)     [facility] Fall Risk Assessment Tool   Hx of falling last 3 months ED Fall :   No   Dawson Bills P-RN - 06/16/2020 21:56 EDT   ED Advance Directive   Advance Directive :   No   Dawson Bills P-RN - 06/16/2020 21:56 EDT

## 2020-06-16 NOTE — ED Provider Notes (Signed)
General Medical Problem *ED        Patient:   Brian Reese, Brian Reese             MRN: [id number]            FIN: [id number]               Age:   54 years     Sex:  Male     DOB:  Jul 16, 1966   Associated Diagnoses:   Paresthesias; Nausea   Author:   Benay Pillow A-MD      Basic Information   Time seen: Provider Seen (ST)   ED Provider/Time:    Benay Pillow A-MD / 06/16/2020 22:05  .   Additional information: Chief Complaint from Nursing Triage Note   Chief Complaint  Chief Complaint: pt arrives via EMS, c/o of jaw numbness/nausea, pt has cardiac hx, denies any Chest pain, weakness began 40 min ago, pt was recently seen here 4 days ago for anxiety, notes it feels different, takes crestor '5mg'$  (06/16/20 21:50:00).      History of Present Illness   Patient presents from home via EMS for evaluation of nausea and paresthesias that started prior to arrival.  He finishing reading a few radiograph studies when symptoms began.  He describes them as "waves" of nausea.  No abdominal.  He noted some paresthesias in the bilateral hands as well as the left jaw.  There is no radiation.  He denies any associated chest pain no acute shortness of breath or other respiratory complaints.  His symptoms are mild and actually states they are starting to resolve now.  He keeps asking me if we gave him Valium because he seems so much more relaxed.  He is a Stage manager.  He notes that he has had several extensive work-ups in the last few months for vague musculoskeletal complaints with no definitive diagnosis.  Recently underwent brain MRI due to some peripheral symptoms that was negative.  He has an ophthalmology appointment tomorrow.  He admits feeling quite anxious and was recently seen at [facility] ER and states he was diagnosed with a panic attack.  He was started on some low-dose Lexapro recently, about a week ago.  Remote cardiac history with LAD stent.  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        Review of Systems             Additional review of systems information: All other systems reviewed and otherwise negative.      Health Status   Allergies:    Allergic Reactions (All)  Severe  Penicillins- Anaphylactic reaction..      Past Medical/ Family/ Social History   Medical history: Reviewed as documented in chart.   Surgical history: Reviewed as documented in chart.   Family history: Not significant.   Social history: Reviewed as documented in chart.   Problem list:    No qualifying data available  .      Physical Examination               Vital Signs   Vital Signs   09/73/5329 92:42 EDT Systolic Blood Pressure 683 mmHg  HI    Diastolic Blood Pressure 84 mmHg    Peripheral Pulse Rate 94 bpm    Heart Rate Monitored 86 bpm    Respiratory Rate 10 br/min  LOW    Mean  Arterial Pressure, Cuff 99 mmHg    SpO2 97 %   35/00/9381 82:99 EDT Systolic Blood Pressure 371 mmHg    Diastolic Blood Pressure 83 mmHg    Peripheral Pulse Rate 87 bpm    Heart Rate Monitored 87 bpm    Respiratory Rate 14 br/min    SpO2 94 %   69/67/8938 10:17 EDT Systolic Blood Pressure 510 mmHg  HI    Diastolic Blood Pressure 88 mmHg    Peripheral Pulse Rate 89 bpm    Heart Rate Monitored 87 bpm    Respiratory Rate 18 br/min    Mean Arterial Pressure, Cuff 115 mmHg  HI    SpO2 96 %   25/85/2778 24:23 EDT Systolic Blood Pressure 536 mmHg  HI    Diastolic Blood Pressure 93 mmHg  HI    Heart Rate Monitored 92 bpm    Respiratory Rate 20 br/min    SpO2 97 %   14/43/1540 08:67 EDT Systolic Blood Pressure 619 mmHg  HI    Diastolic Blood Pressure 98 mmHg  HI    Temperature Oral 36.6 degC    Heart Rate Monitored 106 bpm  HI    Respiratory Rate 26 br/min  HI    SpO2 99 %   .   Measurements   06/16/2020 21:56 EDT Body Mass Index est meas 26.41 kg/m2    Body Mass Index Measured 26.41 kg/m2   06/16/2020 21:50 EDT Height/Length Measured 176 cm    Weight Dosing 81.8 kg   .   Basic Oxygen Information   06/16/2020 22:49 EDT  Oxygen Therapy Room air    SpO2 97 %   06/16/2020 22:30 EDT Oxygen Therapy Room air    SpO2 94 %   06/16/2020 22:19 EDT Oxygen Therapy Room air    SpO2 96 %   06/16/2020 22:06 EDT Oxygen Therapy Room air    SpO2 97 %   06/16/2020 21:50 EDT Oxygen Therapy Room air    SpO2 99 %   .   General:  Alert, anxious.    Skin:  Warm, dry.    Head:  Normocephalic, atraumatic.    Neck:  No JVD.   Eye:  Extraocular movements are intact, normal conjunctiva.    Cardiovascular:  Regular rate and rhythm, No murmur, Normal peripheral perfusion, 2+ right radial and left DP pulse.    Respiratory:  Lungs are clear to auscultation, respirations are non-labored, breath sounds are equal.    Gastrointestinal:  Soft, Nontender, Non distended, Normal bowel sounds.    Musculoskeletal:  No swelling.   Neurological:  Alert and oriented to person, place, time, and situation, No focal neurological deficit observed.    Psychiatric:  Cooperative.      Medical Decision Making   Documents reviewed:  Emergency department nurses' notes, flowsheet, vital signs.    Electrocardiogram:  Emergency Provider interpretation performed by me, See ECG ED Review.    Results review:  Lab results : Lab View   06/17/2020 0:06 EDT Trop T Quant <0.010 ng/mL   06/16/2020 23:09 EDT Estimated Creatinine Clearance 61.60 mL/min   06/16/2020 22:33 EDT WBC 13.1 x10e3/mcL  HI    RBC 4.71 x10e6/mcL    Hgb 14.1 g/dL    HCT 41.4 %    MCV 87.9 fL    MCH 29.9 pg    MCHC 34.1 g/dL    RDW 12.6 %    Platelet 228 x10e3/mcL    MPV 11.5 fL    Neutro Auto 48.2 %  Neutro Absolute 6.3 x10e3/mcL    Immature Grans Percent 0.2 %    Immature Grans Absolute 0.02 x10e3/mcL    Lymph Auto 40.8 %    Lymph Absolute 5.3 x10e3/mcL  HI    Mono Auto 9.0 %    Mono Absolute 1.2 x10e3/mcL  HI    Eosinophil Percent 1.5 %    Eos Absolute 0.2 x10e3/mcL    Basophil Auto 0.3 %    Baso Absolute 0.0 x10e3/mcL    NRBC Absolute Auto 0.000 x10e3/mcL    NRBC Percent Auto 0.0 %    Sodium Lvl 137 mmol/L    Potassium Lvl  3.6 mmol/L    Chloride 102 mmol/L    CO2 17 mmol/L  LOW    Glucose Random 130 mg/dL  HI    BUN 31 mg/dL  HI    Creatinine Lvl 1.4 mg/dL  HI    AGAP 18 mmol/L  HI    Osmolality Calc 282 mOsm/kg    Calcium Lvl 9.3 mg/dL    Protein Total 7.5 g/dL    Albumin Lvl 4.5 g/dL    Alk Phos 57 unit/L    AST 21 unit/L    ALT 43 unit/L  HI    eGFR AA 66 mL/min/1.62m???  LOW    eGFR Non-AA 57 mL/min/1.730m??  LOW    Magnesium Lvl 2.0 mg/dL    Bili Total 0.80 mg/dL    Bili Direct <0.20 mg/dL    Lipase Lvl 41 unit/L    Trop T Quant <0.010 ng/mL    Total CK 80 unit/L   06/16/2020 21:56 EDT Estimated Creatinine Clearance 66.34 mL/min   .   Chest X-Ray:  No acute disease process, interpretation by Emergency Physician.       Reexamination/ Reevaluation   Vital signs   Basic Oxygen Information   06/16/2020 22:49 EDT Oxygen Therapy Room air    SpO2 97 %   06/16/2020 22:30 EDT Oxygen Therapy Room air    SpO2 94 %   06/16/2020 22:19 EDT Oxygen Therapy Room air    SpO2 96 %   06/16/2020 22:06 EDT Oxygen Therapy Room air    SpO2 97 %   06/16/2020 21:50 EDT Oxygen Therapy Room air    SpO2 99 %         Impression and Plan   Diagnosis   Paresthesias (ICD10-CM R20.2, Discharge, Medical)   Nausea (ICD10-CM R11.0, Discharge, Medical)   Plan   Condition: Improved.    Disposition: Discharged: to home.    Patient was given the following educational materials: Paresthesia, Nausea, Adult.    Follow up with: WIFredric DinePhysician - Hospitalist Within 1 week This is a primary care physician.  Please follow-up this doctor or one of his partners for reevaluation and ongoing care locally.  Return to ER any new or worsening symptoms such as chest pain, shortness of breath, abdominal pain, any other concerns.    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  Serial troponins are negative.  Serial EKGs unremarkable.  With the vague nausea and  other symptoms we obtained abdominal labs which are unremarkable.  Nonspecific leukocytosis.  He has a chronically elevated creatinine with baseline 1.3-1.4.  Labs suggest some degree of dehydration treated with IV fluids.  He endorses hyperventilating which could have caused some of his paresthesias.  Symptoms resolved although he noted that he repeatedly  felt  anxious over the last several days has felt really anxious with even loud sounds.  He has been treated for such with his regular doctor.  Most of his medical care has been in [place] where he previously practice.  I encouraged local PCP follow-up.  No clinical concern at this time for unstable angina or PE or dissection.  Plan for discharge home with symptomatic treatment with return precautions.    Signature Line     Electronically Signed on 06/17/2020 01:02 AM EDT   ________________________________________________   Benay Pillow A-MD               Modified by: Benay Pillow A-MD on 06/16/2020 11:37 PM EDT      Modified by: Benay Pillow A-MD on 06/17/2020 01:02 AM EDT

## 2020-06-17 LAB — BASIC METABOLIC PANEL
Anion Gap: 18 mmol/L — ABNORMAL HIGH (ref 2–17)
BUN: 31 mg/dL — ABNORMAL HIGH (ref 6–20)
CO2: 17 mmol/L — ABNORMAL LOW (ref 22–29)
Calcium: 9.3 mg/dL (ref 8.6–10.0)
Chloride: 102 mmol/L (ref 98–107)
Creatinine: 1.4 mg/dL — ABNORMAL HIGH (ref 0.7–1.3)
GFR African American: 66 mL/min/{1.73_m2} — ABNORMAL LOW (ref >=90)
GFR Non-African American: 57 mL/min/{1.73_m2} — ABNORMAL LOW (ref >=90)
Glucose: 130 mg/dL — ABNORMAL HIGH (ref 70–99)
Osmolaliy Calculated: 282 mosm/kg (ref 270–287)
Potassium: 3.6 mmol/L (ref 3.5–5.3)
Sodium: 137 mmol/L (ref 135–145)

## 2020-06-17 LAB — CBC WITH AUTO DIFFERENTIAL
Basophils %: 0.3 % (ref 0.0–2.0)
Basophils Absolute: 0 10*3/uL (ref 0.0–0.2)
Eosinophils %: 1.5 % (ref 0.0–7.0)
Eosinophils Absolute: 0.2 10*3/uL (ref 0.0–0.5)
Hematocrit: 41.4 % (ref 38.0–52.0)
Hemoglobin: 14.1 g/dL (ref 13.0–17.3)
Immature Grans (Abs): 0.02 10*3/uL (ref 0.00–0.06)
Immature Granulocytes %: 0.2 % (ref 0.1–0.6)
Lymphocytes Absolute: 5.3 10*3/uL — ABNORMAL HIGH (ref 1.0–3.2)
Lymphocytes: 40.8 % (ref 15.0–45.0)
MCH: 29.9 pg (ref 27.0–34.5)
MCHC: 34.1 g/dL (ref 32.0–36.0)
MCV: 87.9 fL (ref 84.0–100.0)
MPV: 11.5 fL (ref 7.2–13.2)
Monocytes %: 9 % (ref 4.0–12.0)
Monocytes Absolute: 1.2 10*3/uL — ABNORMAL HIGH (ref 0.3–1.0)
NRBC Absolute: 0 10*3/uL (ref 0.000–0.012)
NRBC Automated: 0 % (ref 0.0–0.2)
Neutrophils %: 48.2 % (ref 42.0–74.0)
Neutrophils Absolute: 6.3 10*3/uL (ref 1.6–7.3)
Platelets: 228 10*3/uL (ref 140–440)
RBC: 4.71 x10e6/mcL (ref 4.00–5.60)
RDW: 12.6 % (ref 11.0–16.0)
WBC: 13.1 10*3/uL — ABNORMAL HIGH (ref 3.8–10.6)

## 2020-06-17 LAB — HEPATIC FUNCTION PANEL
ALT: 43 U/L — ABNORMAL HIGH (ref 0–41)
AST: 21 U/L (ref 0–40)
Albumin: 4.5 g/dL (ref 3.5–5.2)
Alk Phosphatase: 57 U/L (ref 40–130)
Bilirubin, Direct: <0.2 mg/dL (ref 0.00–0.30)
Total Bilirubin: 0.8 mg/dL (ref 0.00–1.20)
Total Protein: 7.5 g/dL (ref 6.4–8.3)

## 2020-06-17 LAB — ADD ON LAB TEST

## 2020-06-17 LAB — TROPONIN T
Troponin T: <0.01 ng/mL (ref 0.000–0.010)
Troponin T: <0.01 ng/mL (ref 0.000–0.010)

## 2020-06-17 LAB — CK: Total CK: 80 U/L (ref 20–200)

## 2020-06-17 LAB — MAGNESIUM: Magnesium: 2 mg/dL (ref 1.6–2.6)

## 2020-06-17 LAB — LIPASE: Lipase: 41 U/L (ref 13–60)

## 2020-06-17 NOTE — Discharge Summary (Signed)
ED Clinical Summary                        [facility]  [address]  [place], [place], [address]  [phone]           PERSON INFORMATION  Name: BREYLON, MERKLINGER Age:  54 Years DOB: Mar 17, 1966   Sex: Male Language: English PCP: PCP,  NONE   Marital Status: Married Phone: [phone] Med Service: MED-Medicine   MRN: [id number] Acct# [id number] Arrival: 06/16/2020 21:48:00   Visit Reason: Chest pain; CP Acuity: 3 LOS: 000 03:14   Address:    [address] [place] [address]   Diagnosis:    Nausea; Paresthesias  Medications:          Medications that have not changed  Other Medications  amLODIPine (Norvasc 5 mg oral tablet) 1 Tabs Oral (given by mouth) every day. Refills: 0.  Last Dose:____________________      Medications Administered During Visit:                Medication Dose Route   Lactated Ringers Injection 1000 mL IV Piggyback               Allergies      penicillins (Anaphylactic reaction)      Major Tests and Procedures:  The following procedures and tests were performed during your ED visit.  COMMON PROCEDURES%>  COMMON PROCEDURES COMMENTS%>                PROVIDER INFORMATION               Provider Role Assigned Margot Ables P-RN ED Nurse 06/16/2020 21:50:16    Lucien Mons A-MD ED Provider 06/16/2020 22:05:38        Attending Physician:  Lucien Mons A-MD      Admit Doc  Lucien Mons A-MD     Consulting Doc       VITALS INFORMATION  Vital Sign Triage Latest   Temp Oral ORAL_1%> ORAL%>   Temp Temporal TEMPORAL_1%> TEMPORAL%>   Temp Intravascular INTRAVASCULAR_1%> INTRAVASCULAR%>   Temp Axillary AXILLARY_1%> AXILLARY%>   Temp Rectal RECTAL_1%> RECTAL%>   02 Sat 99 % 97 %   Respiratory Rate RATE_1%> RATE%>   Peripheral Pulse Rate PULSE RATE_1%>89 bpm PULSE RATE%>   Apical Heart Rate HEART RATE_1%> HEART RATE%>   Blood Pressure BLOOD PRESSURE_1%>/ BLOOD PRESSURE_1%>98 mmHg BLOOD PRESSURE%> / BLOOD PRESSURE%>99 mmHg                 Immunizations      No Immunizations Documented  This Visit          DISCHARGE INFORMATION   Discharge Disposition: H Outpt-Sent Home   Discharge Location:  Home   Discharge Date and Time:  06/17/2020 01:02:00   ED Checkout Date and Time:  06/17/2020 01:02:00     DEPART REASON INCOMPLETE INFORMATION               Depart Action Incomplete Reason   Interactive View/I&O Recently assessed               Problems      No Problems Documented              Smoking Status      No Smoking Status Documented         PATIENT EDUCATION INFORMATION  Instructions:     Nausea, Adult; Paresthesia  Follow up:                   With: Address: When:   Arsenio Loader, Physician - Hospitalist [address] [place], [place] [address]  [phone] Business (1) Within 1 week   Comments:   This is a primary care physician. Please follow-up this doctor or one of his partners for reevaluation and ongoing care locally. Return to ER any new or worsening symptoms such as chest pain, shortness of breath, abdominal pain, any other concerns              ED PROVIDER DOCUMENTATION     Patient:   HOSSAM, DEFLORIO             MRN: [id number]            FIN: [id number]               Age:   45 years     Sex:  Male     DOB:  09/20/1965   Associated Diagnoses:   Paresthesias; Nausea   Author:   Lucien Mons A-MD      Basic Information   Time seen: Provider Seen (ST)   ED Provider/Time:    Lucien Mons A-MD / 06/16/2020 22:05  .   Additional information: Chief Complaint from Nursing Triage Note   Chief Complaint  Chief Complaint: pt arrives via EMS, c/o of jaw numbness/nausea, pt has cardiac hx, denies any Chest pain, weakness began 40 min ago, pt was recently seen here 4 days ago for anxiety, notes it feels different, takes crestor 5mg  (06/16/20 21:50:00).      History of Present Illness   Patient presents from home via EMS for evaluation of nausea and paresthesias that started prior to arrival.  He finishing reading a few radiograph studies when symptoms began.  He describes them as "waves" of  nausea.  No abdominal.  He noted some paresthesias in the bilateral hands as well as the left jaw.  There is no radiation.  He denies any associated chest pain no acute shortness of breath or other respiratory complaints.  His symptoms are mild and actually states they are starting to resolve now.  He keeps asking me if we gave him Valium because he seems so much more relaxed.  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  Recently underwent brain MRI due to some peripheral symptoms that was negative.  He has an ophthalmology appointment tomorrow.  He admits feeling quite anxious and was recently seen at [facility] ER and states he was diagnosed with a panic attack.  He was started on some low-dose Lexapro recently, about a week ago.  Remote cardiac history with LAD stent.  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        Review of Systems             Additional review of systems information: All other systems reviewed and otherwise negative.      Health Status   Allergies:    Allergic Reactions (All)  Severe  Penicillins- Anaphylactic reaction..      Past Medical/ Family/ Social History   Medical history: Reviewed as documented in chart.   Surgical history: Reviewed as documented in chart.   Family  ED Clinical Summary                        [facility]  [address]  [place], [place], [address]  [phone]           PERSON INFORMATION  Name: BREYLON, MERKLINGER Age:  54 Years DOB: Mar 17, 1966   Sex: Male Language: English PCP: PCP,  NONE   Marital Status: Married Phone: [phone] Med Service: MED-Medicine   MRN: [id number] Acct# [id number] Arrival: 06/16/2020 21:48:00   Visit Reason: Chest pain; CP Acuity: 3 LOS: 000 03:14   Address:    [address] [place] [address]   Diagnosis:    Nausea; Paresthesias  Medications:          Medications that have not changed  Other Medications  amLODIPine (Norvasc 5 mg oral tablet) 1 Tabs Oral (given by mouth) every day. Refills: 0.  Last Dose:____________________      Medications Administered During Visit:                Medication Dose Route   Lactated Ringers Injection 1000 mL IV Piggyback               Allergies      penicillins (Anaphylactic reaction)      Major Tests and Procedures:  The following procedures and tests were performed during your ED visit.  COMMON PROCEDURES%>  COMMON PROCEDURES COMMENTS%>                PROVIDER INFORMATION               Provider Role Assigned Margot Ables P-RN ED Nurse 06/16/2020 21:50:16    Lucien Mons A-MD ED Provider 06/16/2020 22:05:38        Attending Physician:  Lucien Mons A-MD      Admit Doc  Lucien Mons A-MD     Consulting Doc       VITALS INFORMATION  Vital Sign Triage Latest   Temp Oral ORAL_1%> ORAL%>   Temp Temporal TEMPORAL_1%> TEMPORAL%>   Temp Intravascular INTRAVASCULAR_1%> INTRAVASCULAR%>   Temp Axillary AXILLARY_1%> AXILLARY%>   Temp Rectal RECTAL_1%> RECTAL%>   02 Sat 99 % 97 %   Respiratory Rate RATE_1%> RATE%>   Peripheral Pulse Rate PULSE RATE_1%>89 bpm PULSE RATE%>   Apical Heart Rate HEART RATE_1%> HEART RATE%>   Blood Pressure BLOOD PRESSURE_1%>/ BLOOD PRESSURE_1%>98 mmHg BLOOD PRESSURE%> / BLOOD PRESSURE%>99 mmHg                 Immunizations      No Immunizations Documented  This Visit          DISCHARGE INFORMATION   Discharge Disposition: H Outpt-Sent Home   Discharge Location:  Home   Discharge Date and Time:  06/17/2020 01:02:00   ED Checkout Date and Time:  06/17/2020 01:02:00     DEPART REASON INCOMPLETE INFORMATION               Depart Action Incomplete Reason   Interactive View/I&O Recently assessed               Problems      No Problems Documented              Smoking Status      No Smoking Status Documented         PATIENT EDUCATION INFORMATION  Instructions:     Nausea, Adult; Paresthesia  Follow up:                   With: Address: When:   Arsenio Loader, Physician - Hospitalist [address] [place], [place] [address]  [phone] Business (1) Within 1 week   Comments:   This is a primary care physician. Please follow-up this doctor or one of his partners for reevaluation and ongoing care locally. Return to ER any new or worsening symptoms such as chest pain, shortness of breath, abdominal pain, any other concerns              ED PROVIDER DOCUMENTATION     Patient:   HOSSAM, DEFLORIO             MRN: [id number]            FIN: [id number]               Age:   45 years     Sex:  Male     DOB:  09/20/1965   Associated Diagnoses:   Paresthesias; Nausea   Author:   Lucien Mons A-MD      Basic Information   Time seen: Provider Seen (ST)   ED Provider/Time:    Lucien Mons A-MD / 06/16/2020 22:05  .   Additional information: Chief Complaint from Nursing Triage Note   Chief Complaint  Chief Complaint: pt arrives via EMS, c/o of jaw numbness/nausea, pt has cardiac hx, denies any Chest pain, weakness began 40 min ago, pt was recently seen here 4 days ago for anxiety, notes it feels different, takes crestor 5mg  (06/16/20 21:50:00).      History of Present Illness   Patient presents from home via EMS for evaluation of nausea and paresthesias that started prior to arrival.  He finishing reading a few radiograph studies when symptoms began.  He describes them as "waves" of  nausea.  No abdominal.  He noted some paresthesias in the bilateral hands as well as the left jaw.  There is no radiation.  He denies any associated chest pain no acute shortness of breath or other respiratory complaints.  His symptoms are mild and actually states they are starting to resolve now.  He keeps asking me if we gave him Valium because he seems so much more relaxed.  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  Recently underwent brain MRI due to some peripheral symptoms that was negative.  He has an ophthalmology appointment tomorrow.  He admits feeling quite anxious and was recently seen at [facility] ER and states he was diagnosed with a panic attack.  He was started on some low-dose Lexapro recently, about a week ago.  Remote cardiac history with LAD stent.  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        Review of Systems             Additional review of systems information: All other systems reviewed and otherwise negative.      Health Status   Allergies:    Allergic Reactions (All)  Severe  Penicillins- Anaphylactic reaction..      Past Medical/ Family/ Social History   Medical history: Reviewed as documented in chart.   Surgical history: Reviewed as documented in chart.   Family  Follow up:                   With: Address: When:   Arsenio Loader, Physician - Hospitalist [address] [place], [place] [address]  [phone] Business (1) Within 1 week   Comments:   This is a primary care physician. Please follow-up this doctor or one of his partners for reevaluation and ongoing care locally. Return to ER any new or worsening symptoms such as chest pain, shortness of breath, abdominal pain, any other concerns              ED PROVIDER DOCUMENTATION     Patient:   HOSSAM, DEFLORIO             MRN: [id number]            FIN: [id number]               Age:   45 years     Sex:  Male     DOB:  09/20/1965   Associated Diagnoses:   Paresthesias; Nausea   Author:   Lucien Mons A-MD      Basic Information   Time seen: Provider Seen (ST)   ED Provider/Time:    Lucien Mons A-MD / 06/16/2020 22:05  .   Additional information: Chief Complaint from Nursing Triage Note   Chief Complaint  Chief Complaint: pt arrives via EMS, c/o of jaw numbness/nausea, pt has cardiac hx, denies any Chest pain, weakness began 40 min ago, pt was recently seen here 4 days ago for anxiety, notes it feels different, takes crestor 5mg  (06/16/20 21:50:00).      History of Present Illness   Patient presents from home via EMS for evaluation of nausea and paresthesias that started prior to arrival.  He finishing reading a few radiograph studies when symptoms began.  He describes them as "waves" of  nausea.  No abdominal.  He noted some paresthesias in the bilateral hands as well as the left jaw.  There is no radiation.  He denies any associated chest pain no acute shortness of breath or other respiratory complaints.  His symptoms are mild and actually states they are starting to resolve now.  He keeps asking me if we gave him Valium because he seems so much more relaxed.  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  Recently underwent brain MRI due to some peripheral symptoms that was negative.  He has an ophthalmology appointment tomorrow.  He admits feeling quite anxious and was recently seen at [facility] ER and states he was diagnosed with a panic attack.  He was started on some low-dose Lexapro recently, about a week ago.  Remote cardiac history with LAD stent.  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        Review of Systems             Additional review of systems information: All other systems reviewed and otherwise negative.      Health Status   Allergies:    Allergic Reactions (All)  Severe  Penicillins- Anaphylactic reaction..      Past Medical/ Family/ Social History   Medical history: Reviewed as documented in chart.   Surgical history: Reviewed as documented in chart.   Family

## 2020-06-17 NOTE — ED Notes (Signed)
ED Patient Education Note     Patient Education Materials Follows:  Endocrinology     Paresthesia    Paresthesia is an abnormal burning or prickling sensation. This sensation is generally felt in the hands, arms, legs, or feet. However, it may occur in any part of the body. Usually, it is not painful. The feeling may be described as:     Tingling or numbness.     Pins and needles.     Skin crawling.     Buzzing.     Limbs falling asleep.     Itching.    Most people experience temporary (transient) paresthesia at some time in their lives. Paresthesia may occur when you breathe too quickly (hyperventilation). It can also occur without any apparent cause. Commonly, paresthesia occurs when pressure is placed on a nerve. The sensation quickly goes away after the pressure is removed. For some people, however, paresthesia is a long-lasting (chronic) condition that is caused by an underlying disorder. If you continue to have paresthesia, you may need further medical evaluation.    HOME CARE INSTRUCTIONS    Watch your condition for any changes. Taking the following actions may help to lessen any discomfort that you are feeling:     Avoid drinking alcohol.     Try acupuncture or massage to help relieve your symptoms.     Keep all follow-up visits as directed by your health care provider. This is important.    SEEK MEDICAL CARE IF:     You continue to have episodes of paresthesia.     Your burning or prickling feeling gets worse when you walk.     You have pain, cramps, or dizziness.     You develop a rash.    SEEK IMMEDIATE MEDICAL CARE IF:     You feel weak.     You have trouble walking or moving.     You have problems with speech, understanding, or vision.     You feel confused.     You cannot control your bladder or bowel movements.     You have numbness after an injury.     You faint.    This information is not intended to replace advice given to you by your health care provider. Make sure you discuss any questions you  have with your health care provider.    Document Released: 08/13/2002 Document Revised: 01/07/2015 Document Reviewed: 08/19/2014  Elsevier Interactive Patient Education ?2016 Elsevier Inc[employer]      Obstetrics and Gynecology     Nausea, Adult    Nausea is the feeling that you have an upset stomach or have to vomit. Nausea by itself is not likely a serious concern, but it may be an early sign of more serious medical problems. As nausea gets worse, it can lead to vomiting. If vomiting develops, there is the risk of dehydration.       CAUSES     Viral infections.     Food poisoning.     Medicines.     Pregnancy.     Motion sickness.     Migraine headaches.     Emotional distress.     Severe pain from any source.     Alcohol intoxication.     HOME CARE INSTRUCTIONS     Get plenty of rest.      Ask your caregiver about specific rehydration instructions.     Eat small amounts of food and sip liquids more often.       Take all medicines as told by your caregiver.     SEEK MEDICAL CARE IF:     You have not improved after 2 days, or you get worse.     You have a headache.    SEEK IMMEDIATE MEDICAL CARE IF:     You have a fever.      You faint.     You keep vomiting or have blood in your vomit.     You are extremely weak or dehydrated.     You have dark or bloody stools.     You have severe chest or abdominal pain.     MAKE SURE YOU:     Understand these instructions.     Will watch your condition.     Will get help right away if you are not doing well or get worse.    This information is not intended to replace advice given to you by your health care provider. Make sure you discuss any questions you have with your health care provider.    Document Released: 09/30/2004 Document Revised: 09/13/2014 Document Reviewed: 04/29/2015  Elsevier Interactive Patient Education ?2016 Elsevier Inc.

## 2020-06-17 NOTE — ED Notes (Signed)
ED Patient Education Note     Patient Education Materials Follows:  Endocrinology     Paresthesia    Paresthesia is an abnormal burning or prickling sensation. This sensation is generally felt in the hands, arms, legs, or feet. However, it may occur in any part of the body. Usually, it is not painful. The feeling may be described as:     Tingling or numbness.     Pins and needles.     Skin crawling.     Buzzing.     Limbs falling asleep.     Itching.    Most people experience temporary (transient) paresthesia at some time in their lives. Paresthesia may occur when you breathe too quickly (hyperventilation). It can also occur without any apparent cause. Commonly, paresthesia occurs when pressure is placed on a nerve. The sensation quickly goes away after the pressure is removed. For some people, however, paresthesia is a long-lasting (chronic) condition that is caused by an underlying disorder. If you continue to have paresthesia, you may need further medical evaluation.    HOME CARE INSTRUCTIONS    Watch your condition for any changes. Taking the following actions may help to lessen any discomfort that you are feeling:     Avoid drinking alcohol.     Try acupuncture or massage to help relieve your symptoms.     Keep all follow-up visits as directed by your health care provider. This is important.    SEEK MEDICAL CARE IF:     You continue to have episodes of paresthesia.     Your burning or prickling feeling gets worse when you walk.     You have pain, cramps, or dizziness.     You develop a rash.    SEEK IMMEDIATE MEDICAL CARE IF:     You feel weak.     You have trouble walking or moving.     You have problems with speech, understanding, or vision.     You feel confused.     You cannot control your bladder or bowel movements.     You have numbness after an injury.     You faint.    This information is not intended to replace advice given to you by your health care provider. Make sure you discuss any questions you  have with your health care provider.    Document Released: 08/13/2002 Document Revised: 01/07/2015 Document Reviewed: 08/19/2014  Elsevier Interactive Patient Education ?2016 Elsevier Inc.      Obstetrics and Gynecology     Nausea, Adult    Nausea is the feeling that you have an upset stomach or have to vomit. Nausea by itself is not likely a serious concern, but it may be an early sign of more serious medical problems. As nausea gets worse, it can lead to vomiting. If vomiting develops, there is the risk of dehydration.       CAUSES     Viral infections.     Food poisoning.     Medicines.     Pregnancy.     Motion sickness.     Migraine headaches.     Emotional distress.     Severe pain from any source.     Alcohol intoxication.     HOME CARE INSTRUCTIONS     Get plenty of rest.      Ask your caregiver about specific rehydration instructions.     Eat small amounts of food and sip liquids more often.  Take all medicines as told by your caregiver.     SEEK MEDICAL CARE IF:     You have not improved after 2 days, or you get worse.     You have a headache.    SEEK IMMEDIATE MEDICAL CARE IF:     You have a fever.      You faint.     You keep vomiting or have blood in your vomit.     You are extremely weak or dehydrated.     You have dark or bloody stools.     You have severe chest or abdominal pain.     MAKE SURE YOU:     Understand these instructions.     Will watch your condition.     Will get help right away if you are not doing well or get worse.    This information is not intended to replace advice given to you by your health care provider. Make sure you discuss any questions you have with your health care provider.    Document Released: 09/30/2004 Document Revised: 09/13/2014 Document Reviewed: 04/29/2015  Elsevier Interactive Patient Education ?2016 Elsevier Inc.

## 2020-06-17 NOTE — ED Notes (Signed)
 ED Patient Summary       ;       [facility] Emergency Department  [address], [place] [address]  [phone]  Discharge Instructions (Patient)  Name: Brian Reese, Brian Reese  DOB:  06/19/1966                   MRN: [id number]                   FIN: [id number]  Reason For Visit: Chest pain; CP  Final Diagnosis: Nausea; Paresthesias     Visit Date: 06/16/2020 21:48:00  Address: [address] [facility] [address]  Phone: [phone]     Emergency Department Providers:         Primary Physician:      REATHA SELINDA LABOR         [facility] would like to thank you for allowing us  to assist you with your healthcare needs. The following includes patient education materials and information regarding your injury/illness.     Follow-up Instructions:  You were seen today on an emergency basis. Please contact your primary care doctor for a follow up appointment. If you received a referral to a specialist doctor, it is important you follow-up as instructed.    It is important that you call your follow-up doctor to schedule and confirm the location of your next appointment. Your doctor may practice at multiple locations. The office location of your follow-up appointment may be different to the one written on your discharge instructions.    If you do not have a primary care doctor, please call [phone] for help in finding a [facility] Provider. For help in finding a specialist doctor, please call [phone].    If your condition gets worse before your follow-up with your primary care doctor or specialist, please return to the Emergency Department.      Coronavirus 2019 (COVID-19) Reminders:     Patients age 66 - 42, with parental consent, and patients over age 23 can make an appointment for a COVID-19 vaccine. Patients can contact their [facility] doctors' offices to schedule an appointment to receive the COVID-19 vaccine. Patients who do not have a [facility] physician can call [phone] to schedule vaccination appointments.      Follow Up Appointments:  Primary Care Provider:     Name: PCP,  NONE     Phone:                  With: Address: When:   ELSIE CLEVERLY, Physician - Hospitalist [address] [place], [place] [address]  [phone] Business (1) Within 1 week   Comments:   This is a primary care physician. Please follow-up this doctor or one of his partners for reevaluation and ongoing care locally. Return to ER any new or worsening symptoms such as chest pain, shortness of breath, abdominal pain, any other concerns                   Medications that have not changed  Other Medications  amLODIPine  (Norvasc  5 mg oral tablet) 1 Tabs Oral (given by mouth) every day. Refills: 0.  Last Dose:____________________      Allergy Info: penicillins     Discharge Additional Information          Discharge Patient 06/17/20 0:45:00 EDT      Patient Education Materials:  Nausea, Adult    Nausea is the feeling that you have an upset stomach or have to vomit. Nausea by itself is not likely a serious concern, but it may be an early sign of more serious medical problems. As nausea gets worse, it can lead to vomiting. If vomiting develops, there is the risk of dehydration.       CAUSES     Viral infections.     Food poisoning.     Medicines.     Pregnancy.     Motion sickness.     Migraine headaches.     Emotional distress.     Severe pain from any source.     Alcohol intoxication.     HOME CARE INSTRUCTIONS     Get plenty of rest.      Ask your caregiver about specific rehydration instructions.     Eat small amounts of food and sip liquids more often.     Take all medicines as told by your caregiver.     SEEK MEDICAL CARE IF:     You have not improved after 2 days, or you get worse.     You have a headache.    SEEK IMMEDIATE MEDICAL CARE IF:     You have a fever.      You faint.     You keep vomiting or have blood in your vomit.     You are extremely weak  or dehydrated.     You have dark or bloody stools.     You have severe chest or abdominal pain.     MAKE SURE YOU:     Understand these instructions.     Will watch your condition.     Will get help right away if you are not doing well or get worse.    This information is not intended to replace advice given to you by your health care provider. Make sure you discuss any questions you have with your health care provider.    Document Released: 09/30/2004 Document Revised: 09/13/2014 Document Reviewed: 04/29/2015  Elsevier Interactive Patient Education ?2016 Elsevier Inc.       Paresthesia    Paresthesia is an abnormal burning or prickling sensation. This sensation is generally felt in the hands, arms, legs, or feet. However, it may occur in any part of the body. Usually, it is not painful. The feeling may be described as:     Tingling or numbness.     Pins and needles.     Skin crawling.     Buzzing.     Limbs falling asleep.     Itching.    Most people experience temporary (transient) paresthesia at some time in their lives. Paresthesia may occur when you breathe too quickly (hyperventilation). It can also occur without any apparent cause. Commonly, paresthesia occurs when pressure is placed on a nerve. The sensation quickly goes away after the pressure is removed. For some people, however, paresthesia is a long-lasting (chronic) condition that is caused by an underlying disorder. If you continue to have paresthesia, you may need further medical evaluation.    HOME CARE INSTRUCTIONS    Watch your condition for any changes. Taking the following actions may help to lessen any discomfort that you are feeling:     Avoid drinking alcohol.     Try acupuncture or massage to help relieve your symptoms.     Keep all follow-up visits as directed by your health care provider. This is important.  SEEK MEDICAL CARE IF:     You continue to have episodes of paresthesia.     Your burning or prickling feeling gets worse when you  walk.     You have pain, cramps, or dizziness.     You develop a rash.    SEEK IMMEDIATE MEDICAL CARE IF:     You feel weak.     You have trouble walking or moving.     You have problems with speech, understanding, or vision.     You feel confused.     You cannot control your bladder or bowel movements.     You have numbness after an injury.     You faint.    This information is not intended to replace advice given to you by your health care provider. Make sure you discuss any questions you have with your health care provider.    Document Released: 08/13/2002 Document Revised: 01/07/2015 Document Reviewed: 08/19/2014  Elsevier Interactive Patient Education ?2016 Elsevier Inc.      ---------------------------------------------------------------------------------------------------------------------  [facility]) encourages you to self-enroll in the [facility] Patient Portal.  [facility] Patient Portal will allow you to manage your personal health information securely from your own electronic device now and in the future.  To begin your Patient Portal enrollment process, please visit [email or web address] Click on "Sign up now" under [facility].  If you find that you need additional assistance on the [facility] Patient Portal or need a copy of your medical records, please call the [facility] Medical Records Office at [phone].  Comment:

## 2020-06-18 ENCOUNTER — Ambulatory Visit

## 2020-07-03 ENCOUNTER — Ambulatory Visit

## 2020-07-03 LAB — CBC
Hematocrit: 46.2 % (ref 38.0–52.0)
Hemoglobin: 15.3 g/dL (ref 13.0–17.3)
MCH: 29.7 pg (ref 27.0–34.5)
MCHC: 33.1 g/dL (ref 32.0–36.0)
MCV: 89.5 fL (ref 84.0–100.0)
MPV: 11.8 fL (ref 7.2–13.2)
NRBC Absolute: 0 10*3/uL (ref 0.000–0.012)
NRBC Automated: 0 % (ref 0.0–0.2)
Platelets: 251 10*3/uL (ref 140–440)
RBC: 5.16 x10e6/mcL (ref 4.00–5.60)
RDW: 13.2 % (ref 11.0–16.0)
WBC: 8.3 10*3/uL (ref 3.8–10.6)

## 2020-07-04 LAB — COMPREHENSIVE METABOLIC PANEL
ALT: 27 U/L (ref 0–41)
AST: 19 U/L (ref 0–40)
Albumin/Globulin Ratio: 1.8 mmol/L (ref 1.00–2.70)
Albumin: 5 g/dL (ref 3.5–5.2)
Alk Phosphatase: 63 U/L (ref 40–130)
Anion Gap: 21 mmol/L — ABNORMAL HIGH (ref 2–17)
BUN: 35 mg/dL — ABNORMAL HIGH (ref 6–20)
CO2: 20 mmol/L — ABNORMAL LOW (ref 22–29)
Calcium: 10.3 mg/dL — ABNORMAL HIGH (ref 8.6–10.0)
Chloride: 103 mmol/L (ref 98–107)
Creatinine: 1.4 mg/dL — ABNORMAL HIGH (ref 0.7–1.3)
GFR African American: 66 mL/min/{1.73_m2} — ABNORMAL LOW (ref >=90)
GFR Non-African American: 57 mL/min/{1.73_m2} — ABNORMAL LOW (ref >=90)
Globulin: 3 g/dL (ref 1.9–4.4)
Glucose: 101 mg/dL — ABNORMAL HIGH (ref 70–99)
Osmolaliy Calculated: 295 mosm/kg — ABNORMAL HIGH (ref 270–287)
Potassium: 4.7 mmol/L (ref 3.5–5.3)
Sodium: 144 mmol/L (ref 135–145)
Total Bilirubin: 0.6 mg/dL (ref 0.00–1.20)
Total Protein: 7.8 g/dL (ref 6.4–8.3)

## 2020-07-04 LAB — THYROID PEROXIDASE ANTIBODY: Thyroid Peroxidase (TPO) Abs: <8 IU/mL (ref 0–34)

## 2020-07-04 LAB — TSH: TSH, 3rd Generation: 1.49 u[IU]/mL (ref 0.358–3.740)

## 2020-07-04 LAB — T3, FREE: T3, Free: 2.49 pg/mL (ref 2.00–4.40)

## 2020-07-04 LAB — T4, FREE: T4 Free: 1.07 ng/dL (ref 0.82–1.70)

## 2020-07-04 LAB — THYROGLOBULIN ANTIBODIES: Thyroglobulin Ab: 1.1 IU/mL — ABNORMAL HIGH (ref 0.0–0.9)

## 2020-10-27 ENCOUNTER — Ambulatory Visit

## 2020-10-28 ENCOUNTER — Ambulatory Visit: Admitting: Internal Medicine
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
## 2020-12-02 NOTE — Progress Notes (Signed)
* * *        **[name], [name]**    --- ---    2 Y old Male, DOB: 11/14/1965, External MRN: [id number]    Account Number: [id number]    [address], [name], [place]    Home: [phone]    Guarantor: [name], [name] Insurance: [employer] PPO    PCP: [name], MD Referring: [name], MD    [facility]        * * *    06/23/2018   **Appointment Provider:** Jackalyn Lombard, MD **CHN#:**  [phone]    --- ---      **Supervising Provider:** Yetta Numbers, MD    ---         **Reason for Appointment**    ---       1\. 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  Reports his pain is well controlled, has a little bit of tenderness over the  dorsal aspect of the middle phalanx, has been able to return to work and has  minimal complaints. His work note was completed in the office.       **Current Medications**    ---    Taking     * Clopidogrel Bisulfate 75 mg Tablet 1 tablet Orally Once a day    ---    * Crestor 40 MG Tablet 1 tablet Orally Once a day    ---    * Lisinopril 40 mg Tablet 1 tablet Orally Once a day    ---    * Loperamide A-D 2 MG Tablet 1 tablet Orally after each loose bowel movement not to exceed 8 tablets a day    ---    Not-Taking/PRN    * Azithromycin 500 MG Tablet 1 tablet Orally once a day for up to 3 days if diarrhea (may repeat if needed)    ---    * Lisinopril 20 MG Tablet 1 tablet Orally Once a day    ---    * PredniSONE 20 MG Tablet 2 tablets Orally Once a day    ---    * Simvastatin Tablet 40 mg Take 1 tablet once a day by mouth once a day    ---    * Vivotif Berna Vaccine . Capsule Delayed Release 1 Orally every other day on an empty stomach; complete 1 week prior to departure; complete at least 1 week before any antibiotics    ---    * Medication List reviewed and reconciled with the patient     ---       **Past Medical History**    ---       Hypertension.        ---    Hypercholesterolemia.        ---    Renal stones.        ---    Renal insufficiency.        ---       **Surgical History**    ---       Closed manipulation and pinning, right middle mallet finger. 06/15/2018    ---       **Family History**    ---  Mother: alive, hypertension, diagnosed with Test    ---    Father: cabg; cad, Test    ---    Siblings: sister healthy    ---    no personal or fh skin cancer.    ---       **Social History**    ---    Work/Occupation: employed full-time.    Alcohol Socially.    Illicit drugs: Denies.    Tobacco history: Never smoked.   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    ---       **Allergies**    ---       Penicillin: anaphylaxis - Allergy    ---       **Hospitalization/Major Diagnostic Procedure**    ---       No Hospitalization History.    ---       **Review of Systems**    ---     _ORT_ :    Eyes No. Ear, Nose Throat No. Digestion, Stomach, Bowel No. Bladder Problems  No. Bleeding Problems No. Numbness/Tingling No. Anxiety/Depression No.  Fever/Chills/Fatigue No. Chest Pain/Tightness/Palpitations No. Skin Rash No.  Dental Problems No. Joint/Muscle Pain/Cramps Yes. Blackout/Fainting No. Other  No.          **Vital Signs**    ---    Pain scale 0, Ht-in 68.       **Physical Examination**    ---    Right hand is neurovascularly intact. He has got no DIP flexion or extension  of the middle finger. Sensation intact radial, ulnar and median nerve  distributions. He has brisk capillary refill in all 5 fingers and fires EDC,  FDS, FDP, EPL, FPL, and interossei to all the fingers. There is a pinpoint  scab over the pin insertion site on the middle finger. He is a little tender  over the tip of the middle finger.       **Assessments**    ---    1\. Mallet finger of right hand - M20.011 (Primary)    ---       **Treatment**    ---       **1\. Others**    Notes: Plan for the postoperative  course was discussed. His questions were  answered. He was seen with Dr. Rodman Pickle. Pin will stay in for 2-3 months and be  taken out in the OR. Postoperatively, he can expect the swelling over the  middle phalanx to reduce the time and he can continue working as tolerated. He  can follow up in 2 months.    ---      **Follow Up**    ---    2 Months    **Appointment Provider:** Jackalyn Lombard, MD    Electronically signed by Yetta Numbers , MD on 08/04/2018 at 09:33 AM EST    Sign off status: Completed        * * *        [facility]    [address]    [place], [address]    [place], [place] [address]    Tel: [phone]    Fax: [phone]              * * *          Patient: Brandon [name], Brandon [name] DOB: 05-05-1966 Progress Note: Jackalyn Lombard,  MD 06/23/2018    ---    Note generated by eClinicalWorks EMR/PM Software ([email or web address])

## 2020-12-02 NOTE — Progress Notes (Signed)
* * *        **  Rodriges, [name]    --- ---    89 Y old Male, DOB: [birth year]    [address] [name], [place] [address]    Home: [phone]    Provider: Adria Dill, MD        * * *    Telephone Encounter    ---    Answered by   Drema Pry  Date: 08/20/2016         Time: 10:41 AM    Caller   Patient    --- ---            Reason   Booking for Dr. Beacher May on 10/07/15            Message                      Please book this patient for Sosie Gato on 10/06/16 at 2:30 pm, per our discussion. Thank you very much.                Action Taken   Fusco,Eathen 08/20/2016 10:43:35 AM > Buckmire,Joanne  08/20/2016 11:16:40 AM > All set for 10/06/16 @2 :30pm                * * *                ---          * * *          Patient: Legros, Audley J DOB: [birth year] Provider: Adria Dill, MD  08/20/2016    ---    Note generated by eClinicalWorks EMR/PM Software ([email or web address])

## 2020-12-02 NOTE — Progress Notes (Signed)
* * *        **[name], [name]**    --- ---    46 Y old Male, DOB: 11/29/1965, External MRN: [id number]    Account Number: [id number]    [name], [place]    Home: [phone]    Guarantor: [place], [name] Insurance: [facility] PPO    PCP: [name], MD Referring: [name], MD    Appointment Facility: Nephrology        * * *    08/17/2018   **Appointment Provider:** Harlene Salts, MD **CHN#:** [id number]    --- ---      **Supervising Provider:** [name], MD    ---         **Reason for Appointment**    ---       1\. NPV for kidney stones    ---       **History of Present Illness**    ---     _GENERAL_ :    Mr. Brandon [name] is a 34yoM with PMH nephrolithiasis, HTN, CAD s/p DES (09/2017),  recently found renal mass concerning for RCC, presenting to reestablish care.    Patient states that he has had 7 or 8 episodes of nephrolithiasis over the  last 10 years, but he is concerned that they are increasing in frequency. Has  had 4 episodes of nephrolithiasis this year. Usually presents as hematuria for  a day or 2 followed by severe flank pain. Almost always left-sided. Follows  with urology at [facility].    Prior procedures:    - ureteroscopy with lithotripsy (06/07/2018)    - cystoscopy with ureteral stent placement (12/2008)    24hr urine collection was done prior to this visit (07/03/2018) but urine  creatinine was higher than expected, possibly representing over-collection    During one of his urologic evaluation, was noted on imaging to have a complex  left renal cystic mass. Currently planned for surgery in January by Urologist  Dr. Spero [name] [facility])    OTHER PMH    CAD s/p DES to LAD in 09/2017, remains on clopidogrel    States he has "cervical spine problems," confirmed on imaging.       **Current Medications**    ---    Taking     * Clopidogrel Bisulfate 75 mg Tablet 1 tablet Orally Once a day    ---    * Crestor 40 MG Tablet 1 tablet Orally Once a day    ---    * Flexeril  10 MG Tablet One tablet Orally PRN for muscle spasm, Notes: as needed    ---    * Lisinopril 40 mg Tablet 1 tablet Orally Once a day    ---    * Loperamide A-D 2 MG Tablet 1 tablet Orally after each loose bowel movement not to exceed 8 tablets a day    ---    Not-Taking/PRN    * Vivotif Berna Vaccine . Capsule Delayed Release 1 Orally every other day on an empty stomach; complete 1 week prior to departure; complete at least 1 week before any antibiotics    ---    * Medication List reviewed and reconciled with the patient    ---       **Past Medical History**    ---       Hypertension.        ---    Hypercholesterolemia.        ---  Renal stones.        ---    Renal insufficiency.        ---       **Surgical History**    ---       Closed manipulation and pinning, right middle mallet finger. 06/15/2018    ---    Ureteroscopy with Lithotripsy, Dr. Dolan [facility])  06/07/2018    ---    Cystoscopy w/ureteral stent placement 12/27/2008    ---       **Family History**    ---       Mother: alive, hypertension, diagnosed with Test    ---    Father: cabg; cad, Test    ---    Siblings: sister healthy    ---       **Social History**    ---    Work/Occupation: employed full-time.    Alcohol Socially.    Illicit drugs: Denies.    Tobacco history: Never smoked.   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    ---       **Allergies**    ---       Penicillin: anaphylaxis - Allergy    ---       **Review of Systems**    ---     _Nephrology_ :    CONSTITUTIONAL: Denies unintentional weight change, fatigue, fever, chills. Marland Kitchen  HENT: Denies headaches, auditory changes, metallic taste, dysphagia. HEART:  Denies chest pain, palpitations, edema. GASTROINTESTINAL: Denies nausea,  vomiting, diarrhea, constipation, abdominal pain, blood in stools.  RESPIRATORY: Denies shortness of breath, cough. GENITOURINARY: Denies  difficulty or pain with urination, blood in urine, rashes, ulcers.  NEUROLOGICAL SYSTEM:  Denies dizziness, numbness, tingling, change in mental  status.          **Vital Signs**    ---    Ht-in 68, Wt-lbs 184.6, BMI 28.07, BP 134/78, HR 72, RR 14, BSA 2.00, Ht-cm  172.72, Wt-kg 83.73, Wt Change -2.8 lb.       **Physical Examination**    ---     _NEPHROLOGY_ :    CONSTITUTIONAL / VITALS: Appears well and in no apparent distress. Weight is  unchanged from previous visit.    HEENT: Eyes with no pallor or icterus. Oropharynx clear and oral mucous  membrane moist.    CVS: JVD not elevated. No carotid bruit. S1 and S2 normal, regular rate and  rhythm. No murmur, rub or gallop. No lower extremity or sacral edema.    LUNGS: Clear to auscultation bilaterally.    ABDOMEN: Not distended, soft and non-tender. No hepato-splenomegaly. Bowel  sounds present. No abdominal bruit.    EXTREMITIES: No joint swelling. No clubbing. Peripheral pulses palpable.    SKIN: No pallor, icterus, bruise or rash.          **Assessments**    ---    1\. Nephrolithiasis - N20.0 (Primary)    ---    2\. Elevated serum creatinine - R79.89    ---      [name] is a 27yoM with PMH nephrolithiasis, HTN, CAD s/p DES (09/2017),  recently found renal mass concerning for RCC, presenting to reestablish care.    ---       **Treatment**    ---       **1\. Nephrolithiasis**    Notes: Patient brought in a stone earlier this week, analysis pending. Urine  uric acid and urine sodium on a 24-hour collection were not notably elevated.  Urine urea nitrogen was elevated and may represent a risk factor for  nephrolithiasis    Of note, creatinine on the 24hr urine collection was higher than expected and  may represent over-collection. Patient has agreed to repeating the urine  collection.    ---        **2\. Elevated serum creatinine**    Notes: Recent 24-hour urine collection yielded CrCl 123 mL/min, UreaCl 61  mL/min. Bloodwork in October showed eGFR-Cr 60, eGFR-cys 108, eGFR-cr-cys 82.  However, as noted above, the 24hr urine collection had more creatinine  than  expected, so we will repeat this measurement. UACr 13    Patient had concern whether being on lisinopril would affect his upcoming  surgery (resection of complex renal cyst); we would not change his HTN regimen  for this surgery, would recommend keeping lisinopril as is.    **Appointment Provider:** Harlene Salts, MD    Electronically signed by [name] MD on 08/18/2018 at 09:52 PM EST    Sign off status: Completed        * * *        Nephrology    [address]    [address] 4th floor    [place], [place] [address]    Tel: [phone]    Fax: [phone]              * * *          Patient: Brandon [name], Brandon [name] DOB: 10/01/1965 Progress Note: Harlene Salts, MD  08/17/2018    ---    Note generated by eClinicalWorks EMR/PM Software ([email or web address])

## 2020-12-02 NOTE — Progress Notes (Signed)
* * *        **[name], [name]**    --- ---    55 Y old Male, DOB: 1966/07/15, External MRN: [id number]    Account Number: [id number]    [name], [place]    Home: [phone]    Guarantor: [place]tt, [name] Insurance: [employer] PPO    PCP: [name], MD Referring: [name], MD    Appointment Facility: [facility]        * * *    06/12/2018  Progress Notes: Yetta Numbers, MD **CHN#:** [phone]    --- ---    ---         **Reason for Appointment**    ---       1\. 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 minor injury  last night while making bed. unable to extend DIP.       **Current Medications**    ---    Taking     * Clopidogrel Bisulfate 75 mg Tablet 1 tablet Orally Once a day    ---    * Crestor 40 MG Tablet 1 tablet Orally Once a day    ---    * Lisinopril 40 mg Tablet 1 tablet Orally Once a day    ---    * Loperamide A-D 2 MG Tablet 1 tablet Orally after each loose bowel movement not to exceed 8 tablets a day    ---    Not-Taking/PRN    * Azithromycin 500 MG Tablet 1 tablet Orally once a day for up to 3 days if diarrhea (may repeat if needed)    ---    * Lisinopril 20 MG Tablet 1 tablet Orally Once a day    ---    * PredniSONE 20 MG Tablet 2 tablets Orally Once a day    ---    * Simvastatin Tablet 40 mg Take 1 tablet once a day by mouth once a day    ---    * Vivotif Berna Vaccine . Capsule Delayed Release 1 Orally every other day on an empty stomach; complete 1 week prior to departure; complete at least 1 week before any antibiotics    ---    * Medication List reviewed and reconciled with the patient    ---       **Past Medical History**    ---       Hypertension.        ---    Hypercholesterolemia.        ---    Renal stones.        ---    Renal insufficiency.        ---       **Surgical History**    ---       No Surgical History  documented.    ---       **Family History**    ---       Mother: alive, hypertension, diagnosed with Test    ---    Father: cabg; cad, Test    ---    Siblings: sister healthy    ---    no personal or fh skin cancer.    ---       **Social  History**    ---    Work/Occupation: employed full-time.    Alcohol Socially.    Illicit drugs: Denies.    Tobacco history: Never smoked.   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    ---       **Allergies**    ---       Penicillin: anaphylaxis - Allergy    ---       **Hospitalization/Major Diagnostic Procedure**    ---       No Hospitalization History.    ---       **Review of Systems**    ---     _ORT_ :    Eyes No. Ear, Nose Throat No. Digestion, Stomach, Bowel No. Bladder Problems  No. Bleeding Problems No. Numbness/Tingling No. Anxiety/Depression No.  Fever/Chills/Fatigue No. Chest Pain/Tightness/Palpitations No. Skin Rash No.  Dental Problems No. Joint/Muscle Pain/Cramps Yes. Blackout/Fainting No. Other  No.          **Vital Signs**    ---    Pain scale 2, Ht-in 68.       **Physical Examination**    ---    apears healthy. skin intact. no swelling. 54 degree droop DIP right middle  finger with mild local tenderness. sensation intact to light touch, fingers  pink and warm with good cap refill. full passive extension.    Xrays of right middle finger taken today, reviewed by me demonstrate no  fracture.       **Assessments**    ---    1\. Mallet finger of right hand - M20.011 (Primary)    ---       **Treatment**    ---       **1\. Mallet finger of right hand**    Notes: nature of condition and treatment options reviewed. placed into a DIP  splint. he does not feel that he will be able to function at work with splint  and would like to proceed with pinning. Risks including but not limited to  infection,nerve damage, stiffness, recurrent droop, need for hardware removal  explained. He would like to proceed.    ---      **Follow Up**    ---    surgery     Electronically signed by Yetta Numbers , MD on 06/12/2018 at 10:44 AM EDT    Sign off status: Completed        * * *        [facility]    [address]    [place], [address]    [place], [place] [address]    Tel: [phone]    Fax: [phone]              * * *          Patient: Brandon [name], Brandon [name] DOB: Oct 27, 1965 Progress Note: Yetta Numbers, MD  06/12/2018    ---    Note generated by eClinicalWorks EMR/PM Software ([email or web address])

## 2020-12-02 NOTE — Progress Notes (Signed)
* * *        **[name], [name]**    --- ---    55 Y old Male, DOB: 1965/11/23, External MRN: [id number]    Account Number: [id number]    [address], [name], [place]    Home: [phone]    Guarantor: [name], [name] Insurance: [employer] PPO Payer ID: PAPER    PCP: [name], MD Referring: [name] External Visit ID:  [id number]    Appointment Facility: Otolaryngology        * * *    07/06/2017  Progress Notes: Elijio Miles, MD **CHN#:** [phone] [id number]    --- ---    ---        Current Medications    ---    Taking     * Lisinopril 20 MG Tablet 1 tablet Orally Once a day    ---    * Loperamide A-D 2 MG Tablet 1 tablet Orally after each loose bowel movement not to exceed 8 tablets a day    ---    * Simvastatin Tablet 40 mg Take 1 tablet once a day by mouth once a day    ---    Not-Taking/PRN    * Azithromycin 500 MG Tablet 1 tablet Orally once a day for up to 3 days if diarrhea (may repeat if needed)    ---    * Vivotif Berna Vaccine . Capsule Delayed Release 1 Orally every other day on an empty stomach; complete 1 week prior to departure; complete at least 1 week before any antibiotics    ---    * Medication List reviewed and reconciled with the patient    ---      Past Medical History    ---       Hypertension.        ---    Hypercholesterolemia.        ---    Renal stones.        ---    Renal insufficiency.        ---      Surgical History    ---      Denies Past Surgical History    ---      Family History    ---      Mother: alive, hypertension, diagnosed with Test    ---    Father: cabg; cad, diagnosed with Test    ---    Siblings: sister healthy    ---    no personal or fh skin cancer.    ---      Social History    ---    Tobacco history: Never smoked.    Work/Occupation: employed full-time.    Alcohol Socially.    Illicit drugs: Denies.   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    ---      Allergies    ---      Penicillin: anaphylaxis: Allergy    ---    [Allergies  Verified]      Hospitalization/Major Diagnostic Procedure    ---      Denies Past Hospitalization    ---      Review of Systems    ---     _ENT Constitutional_ :    Constitutional No. weight gain No. weight loss No. loss of appetite No, no.  fever No. weakness No. fatigue No.    _ENT Cardiac/Vascular_ :    chest pain  No. shortness of breath No. palpitations No. leg swelling No. calf  pain No. dark or dusty skin color No.    _ENT Respiratory_ :    cough No. wheezing No. coughing up blood No.    _ENT Dermatology_ :    rash No. moles No. skin cancer No. hives No. acne No.    _ENT Gastro-intestinal_ :    nausea No. vomiting No. diarrhea No. heartburn No. difficulty swallowing No.  blood in stool No. vomiting blood No. constipation No. abdominal pain No.    _ENT Hematology_ :    easy bleeding No. easy bruising No. swollen glands No.    _ENT Orthopedic/Rheumatology_ :    swollen joints No. joint pain No. back pain No. joint stiffness No. leg cramps  No. easy fractures No.    _ENT Neurology_ :    headaches No. seizures No. difficulty walking No. difficulty with speech No.  memory difficulty No. numbness/tingling No. weakness No. loss of coordination  No. difficulty sleeping No.    _ENT Psychology_ :    anxiousness No. depression No. impulsive behavior No. suicidal thoughts No.  hearing voices No. visual hallucinations No. eating disorders No. physical or  emotional abuse No.    _ENT Ophthalmology_ :    loss of vision No. double vision No. eye swelling No. eye pain No. eye dryness  No. eye drainage No.    _ENT Endocrinology_ :    cold intolerance No. heat intolerance No. increased thirst No. increased  urination No.    _ENT_ :    hearing loss No. ringing in the ears No. ear pain No. dizziness/balance  problems No. nasal congestion No. sinus pain No. nasal drainage No. postnasal  drip No. hoarseness No. throat pain No. neck swelling/pain No.            Reason for Appointment    ---      1\. Right ear blockage    ---       History of Present Illness    ---     _Ear_ :    55 year old male presents with 2 days of right ear blockage. He developed a  sore throat and nasal congestion about 10 days ago. Sore throat has resolved.  Two days ago, the right ear became blocked. He has been using Flonase and  Afrin with improvement in nasal congetion but no change in ear blockage. No  history of allergies.    Denies : hearing loss. tinnitus. otalgia. otorrhea. vertigo. frequent  childhood ear infections. ear surgery.      Vital Signs    ---    Pain scale 0.      Examination    ---     _Constitutional_ :    General Appearance wdwn, NAD, exibits no signs of acute distress.    Communication Patient communicates easily and understands our conversation.    Vocal Quality Normal.    _Neurotology exam_ :    Neurologic exam A&O x 3, CN II-XII Intact. Facial strength HB Grade I, EOMI,  Gaze Alignment normal.    _Ears_ :    Ears normal pinnae bilaterally, normal ear canal bilaterally, left TM normal,  right **TM intact with fluid meniscus in inferior/posterior middle ear with  aeration superiorly/anteriorly** **,** **microscopic** exam.    _Nose_ :    Nose dorsum straight, normal, septum midline, turbinates normal, middle meati  normal, mucosa normal, no purulent drainange .    _Oral Cavity/Oropharynx_ :    Oral Cavity/Oropharynx  lips normal, buccal mucosa normal, teeth normal,  gingiva normal, floor of mouth normal, tongue normal, hard and soft palate  normal, tonsils 2+. Posterior pharynx with normal mucosa and no lesions,  Posterior pharangeal wall normal.    _Neck_ :    Neck No adenopathy, No masses, Parotid glands normal, Submandibular glands  normal, Laryngeal framework normal, Thyroid gland normal without enargement,  tenderness or masses, Neck muscles normal, No TMJ or Muscular tenderness.    _Head and Face_ :    Overall appearance Normal.    Sinus Tenderness None.    _Skin/Neck/Lymph_ :    Overall appearance (deformity, masses): No deformity or  masses.    Face and Neck Skin: No scars, no lesions, normal texture.    Palpation/percussion of face, sinus tenderness: Palpation/percussion of face,  with no sinus tenderness.    Larynx, trachea (eg, position, crepitus, tenderness): Normal position, no  crepitius or tenderness.    _Results_ :    __.          Assessments    ---    1\. Right acute serous otitis media, recurrence not specified - H65.01  (Primary)    ---      Right serous otitis media in settin of recent URI. Patient deferred  fiberoptic laryngoscopy.    ---      Treatment    ---       **1\. Right acute serous otitis media, recurrence not specified**    Start PredniSONE Tablet, 20 MG, 2 tablets, Orally, Once a day, 2 day(s), 4,  Refills 0    Notes: Short steroid burst    If no improvement, recommend CT temporal bone.    ---        **2\. Others**    Notes: I personally interviewed and examined the patient and, if applicable,  performed the documented procedure with Benetta Spar PA-C who was  involved in the visit today. I agree with the history, exam, procedure  (description and findings if applicable) and assessment and plan as detailed  in the above note in its entirety. Because this note was not dictated by  myself or the resident, but some of the note content may have been put into  the record by the resident via eClinicalWorks, the content was checked for  accuracy based on my own interactions today and I made appropriate corrections  and additions before locking the note for the permanent record.      Follow Up    ---    2 Weeks, sooner prn    Electronically signed by Elijio Miles , MD on 07/06/2017 at 09:50 PM EDT    Sign off status: Completed        * * *        Otolaryngology    [address]    [place], [place] [address]    Tel: [phone]    Fax: [phone]              * * *          Patient: Brandon [name], Brandon [name] DOB: Dec 04, 1965 Progress Note: Elijio Miles, MD  07/06/2017    ---    Note generated by eClinicalWorks EMR/PM Software  ([email or web address])

## 2020-12-02 NOTE — Progress Notes (Signed)
* * *        **Brandon [name], Brandon [name]**    --- ---    86 Y old Male, DOB: 06-20-1966, External MRN: [id number]    Account Number: [id number]    [address] Brandon [name], Brandon [name]    Home: [phone]    Guarantor: [place], [name] Insurance: [facility] PPO Payer ID: PAPER    PCP: Brandon Gilles, MD Referring: Brandon [name] External Visit ID:  [id number]    Appointment Facility: Cardiovascular Clinic        * * *    11/23/2017   **Appointment Provider:** Brandon [name] **CHN#:** [id number]    --- ---      **Supervising Provider:** Brandon [name]    ---         **Current Medications**    ---    Taking     * Lisinopril 20 MG Tablet 1 tablet Orally Once a day    ---    * Loperamide A-D 2 MG Tablet 1 tablet Orally after each loose bowel movement not to exceed 8 tablets a day    ---    * PredniSONE 20 MG Tablet 2 tablets Orally Once a day    ---    * Simvastatin Tablet 40 mg Take 1 tablet once a day by mouth once a day    ---    Not-Taking/PRN    * Azithromycin 500 MG Tablet 1 tablet Orally once a day for up to 3 days if diarrhea (may repeat if needed)    ---    * Vivotif Berna Vaccine . Capsule Delayed Release 1 Orally every other day on an empty stomach; complete 1 week prior to departure; complete at least 1 week before any antibiotics    ---      Past Medical History    ---       Hypertension.        ---    Hypercholesterolemia.        ---    Renal stones.        ---    Renal insufficiency.        ---       **Surgical History**    ---       No Surgical History documented.    ---       **Family History**    ---       Mother: alive, hypertension, diagnosed with Test    ---    Father: cabg; cad, Test    ---    Siblings: sister healthy    ---    no personal or fh skin cancer.    ---       **Social History**    ---    Tobacco history: Never smoked.    Work/Occupation: employed full-time.    Alcohol Socially.    Illicit drugs: Denies.   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    ---       **Allergies**    ---        Penicillin: anaphylaxis - Allergy    ---    [Allergies Verified]       **Hospitalization/Major Diagnostic Procedure**    ---       No Hospitalization History.    ---       **Review of Systems**    ---     _CardioVascular_ :    Neurologic no numbness or weakness, no visual disturbance, no difficulty  speaking, parasthesias. General/Constitutional no fever, chills, night sweats,  sleep disturbance, headaches, no change in appetite, no fatigue, no weight  gain/loss. Gastrointestinal no melena, no hematochezia, hematemses, no blood  in stool, nausea, vomiting, diarrhea, abdominal pain. Hematology no easy  bruising, no prolonged bleeding. Peripheral Vascular no claudication, no skin  ulceration. Endocrine no polydipsia, polyphagia, hot and cold intolerance.  Skin no rash. ENT no nosebleeds. Cardiovascular See HPI. Respiratory See HPI.  Genitourinary no blood in urine. Musculoskeletal no arthraglias, myalgias, or  muscle aches.            **Reason for Appointment**    ---       1\. Follow-Up appointment status post PCI in February    ---       **History of Present Illness**    ---     _GENERAL_ :    Dear Dr. 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    This is his first outpatient cardiology visit after hospitalization for  unstable angina and subsequent percutaneous coronary intervention.    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 In 10/2017, he was complaining of 2 months of antecedent chest pain  and was admitted after an outpatient CT revealed high grade left anterior  descending artery stenosis with stigmata concerning for plaque rupture. He  underwent a left heart catheterization on 01/26, which showed a 90% mid LAD  stenosis and he received a 30 x 20 Synergy drug-eluting stent. This  stent was  postdilated to 3.5. The access was through the right radial artery. The  procedure was complicated by a right radial artery hematoma, which has since  resolved.    Regarding his review of cardiovascular symptoms, he denies any chest pain or  dyspnea at rest or with exertion, orthopnea, PND, palpitations,  lightheadedness, dizziness, lower extremity edema, presyncope, or syncope. He  remains adherent with his medications, especially dual antiplatelet therapy.  He reports no functional limitations at work or with recreation. He remains  engaged in both familial and professional activities.       **Vital Signs**    ---    Pain scale 0, Ht-in 68, Wt-lbs 187.4, BMI 28.49, BP 138/81, HR 85, BSA 2.02,  O2 96%RA, Wt Change -6.6 lb.       **Physical Examination**    ---     _General Examination_ :    General Appearance well developed, well nourished, alert, oriented, in no  acute distress. Head normocephalic. Eyes sclera non-icteric. Neck/Thyroid  carotid pulse normal, no carotid bruit, no jugular venous distension. Skin no  rashes. Heart regular rate and rhythm, normal S1, normal S2, diastolic murmur  II/IV, no friction rubs, no clicks, no gallops. Lungs clear to auscultation  bilaterally with no wheezing, no crackles, no rhonchi. Abdomen soft, non  tender, non distended. Extremities no edema, no clubbing, no cyanosis.  Peripheral Pulses normal symmetric pulses bilaterally.          **Assessments**    ---    1\. 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 He received 1 drug-eluting stent to the mid LAD. He has  been intolerant of dual antiplatelet therapy and returns to the  outpatient  office for a routine follow-up evaluation. His right wrist appears to be  intact from a neurovascular evaluation. He does not have any numbness or  tingling or functional disability with the use of his right hand. Regarding  his dual antiplatelet therapy, he remains on aspirin and Plavix. He denies any  hematemesis, hematochezia or hematuria. He does report some nuisance bleeding,  but otherwise no other major concerns. He is currently on Crestor 40 mg once  daily. His last lipid profile reveals a total cholesterol of 189 mg/dL,  triglyceride level of 496 mg/dL, HDL level of 29 mg/dL and an LDL of 161. He  has not had a subsequent lipid profile after initiation of statin therapy.  Regarding a diagnostic plan, we plan to evaluate his lipid profile by  obtaining a lipid panel after today's office visit.    Regarding his hypertension, he is currently on Toprol-XL and lisinopril 20. He  still remains hypertensive at today's office visit with a blood pressure of  138/81. We suggest increasing his lisinopril from 20 mg to 40 mg and  continuing with Toprol-XL at 25 mg. Regarding his dyslipidemia, we suggest  that he continue with Crestor 40 mg once nightly and we will reevaluate the  need for additional therapy after looking at his lipid panel.    Upon evaluation examination, he does have an auscultatory diastolic murmur and  we would like to evaluate his cardiac structure and function with an  echocardiogram. We plan to see him back in the office in 3 months to review  the diagnostic testing recommended.    Thank you very much for allowing our team to participate in the care of Dr.  Elesa [name]. Should there be any questions regarding his care, please do not hesitate  to contact the office.    ---       **Treatment**    ---       **1\. Coronary artery disease involving native coronary artery of native  heart without angina pectoris**    Start Clopidogrel Bisulfate Tablet, 75 mg, 1 tablet, Orally, Once a day,  90  days, 90 Tablet, Refills 3    Start Lisinopril Tablet, 40 mg, 1 tablet, Orally, Once a day, 90 days, 90  Tablet, Refills 3    _LAB: CPK+CKMB (CKMB)_    _LAB: LIPID PANEL_    _LAB: Liver Function Tests Panel (LFTP)_    _IMAGING: Echo (2D)_    Notes: To have fasting blood work done.    ---      **Follow Up**    ---    3 Months    **Appointment Provider:** Brandon [name]    Electronically signed by Frances Maywood , MD on 01/12/2018 at 03:33 PM EDT    Sign off status: Completed        * * *        Cardiovascular Clinic    [address]    [address]    [place], [place] [address]    Tel: [phone]    Fax: [phone]              * * *          Patient: Brandon [name], Brandon [name] DOB: 03/06/1966 Progress Note: Amber Guthridge  11/23/2017    ---    Note generated by eClinicalWorks EMR/PM Software ([email or web address])

## 2020-12-12 LAB — COMPREHENSIVE METABOLIC PANEL
ALT: 39 U/L (ref 0–50)
AST: 20 U/L (ref 0–50)
Albumin/Globulin Ratio: 1.8 mmol/L (ref 1.00–2.70)
Albumin: 4.8 g/dL (ref 3.5–5.2)
Alk Phosphatase: 50 U/L (ref 40–130)
Anion Gap: 14 mmol/L (ref 2–17)
BUN/Creatinine Ratio: 24.7 — ABNORMAL HIGH (ref 6.0–17.0)
BUN: 37 mg/dL — ABNORMAL HIGH (ref 6–20)
CO2: 20 mmol/L — ABNORMAL LOW (ref 22–29)
Calcium: 9.3 mg/dL (ref 8.6–10.0)
Chloride: 105 mmol/L (ref 98–107)
Creatinine: 1.5 mg/dL — ABNORMAL HIGH (ref 0.7–1.3)
GFR African American: 61 mL/min/{1.73_m2} — ABNORMAL LOW (ref >=90)
GFR Non-African American: 52 mL/min/{1.73_m2} — ABNORMAL LOW (ref >=90)
Globulin: 2.7 g/dL (ref 1.9–4.4)
Glucose: 96 mg/dL (ref 70–99)
Osmolaliy Calculated: 286 mosm/kg (ref 270–287)
Potassium: 4.4 mmol/L (ref 3.5–5.3)
Sodium: 139 mmol/L (ref 135–145)
Total Bilirubin: 0.82 mg/dL (ref 0.00–1.20)
Total Protein: 7.5 g/dL (ref 6.4–8.3)

## 2020-12-12 LAB — CBC
Hematocrit: 44.2 % (ref 38.0–52.0)
Hemoglobin: 15.3 g/dL (ref 13.0–17.3)
MCH: 30.2 pg (ref 27.0–34.5)
MCHC: 34.6 g/dL (ref 32.0–36.0)
MCV: 87.2 fL (ref 84.0–100.0)
MPV: 10.9 fL (ref 7.2–13.2)
NRBC Absolute: 0 10*3/uL (ref 0.000–0.012)
NRBC Automated: 0 % (ref 0.0–0.2)
Platelets: 227 10*3/uL (ref 140–440)
RBC: 5.07 x10e6/mcL (ref 4.00–5.60)
RDW: 13 % (ref 11.0–16.0)
WBC: 7.7 10*3/uL (ref 3.8–10.6)

## 2020-12-12 LAB — LIPID PANEL
Chol/HDL Ratio: 6 — ABNORMAL HIGH (ref 0.0–4.4)
Cholesterol: 204 mg/dL — ABNORMAL HIGH (ref 100–200)
HDL: 34 mg/dL — ABNORMAL LOW (ref >=40)
LDL Cholesterol: 106.4 mg/dL — ABNORMAL HIGH (ref 0.0–100.0)
LDL/HDL Ratio: 3.1
Triglycerides: 318 mg/dL — ABNORMAL HIGH (ref 0–149)
VLDL: 63.6 mg/dL — ABNORMAL HIGH (ref 5.0–40.0)

## 2020-12-12 LAB — TSH: TSH, 3rd Generation: 1.65 u[IU]/mL (ref 0.358–3.740)

## 2020-12-12 LAB — C-REACTIVE PROTEIN: CRP: <0.3 mg/dL (ref 0.00–0.50)

## 2020-12-16 ENCOUNTER — Telehealth (HOSPITAL_BASED_OUTPATIENT_CLINIC_OR_DEPARTMENT_OTHER): Admitting: Internal Medicine

## 2020-12-17 NOTE — Telephone Encounter (Addendum)
 Patient emailed me and asked me to call him.  We spoke for a bit.  He is in [place].  He is concerned that he may be suffering from long Covid symptoms.  He had Covid last year and feels that he still has a bit of brain fog and fatigue.  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    Over the past few weeks, however, he has developed a dry cough and dyspnea on exertion.He fatigues much easier also.  This very much reminds him of an episode he had 10 years ago when he was treated for mycoplasma pneumonia with a Z-Pak and got better very quickly.  He was seen in urgent care office then but no chest x-ray was done.    He was seen by local MD in [place] few days ago who listened to his lungs and so they were clear.  He had a CBC done which was normal.  The patient self has a pulse ox at home which always reads about 96-97%.  He has not checked it while ambulating.    The doctor he saw few days ago gave him a 10-day course of 500 mg daily azithromycin.  He took his first dose today.    He talked about coming up to [facility] next week to be seen and evaluated to try to determine if this was pneumonia or possibly long Covid.  We agreed that he would try the azithromycin.    I got a call back today, 12/17/2020, that he felt significantly better today after that 1 dose of azithromycin yesterday.  We have agreed that he would take 500 mg daily for 3 days only and he will let me know how he feels.  I am happy to see him next week if he comes up to [facility]

## 2021-01-06 ENCOUNTER — Telehealth (HOSPITAL_BASED_OUTPATIENT_CLINIC_OR_DEPARTMENT_OTHER): Admitting: Internal Medicine

## 2021-01-06 NOTE — Telephone Encounter (Signed)
Patient texted me that he developed rather severe pain in his left knee acutely around 12 or 14 days ago while carrying something heavy.  He now has an effusion and pain with weightbearing.  He has been taking over-the-counter pain relievers without benefit.  He is requesting an MRI.

## 2021-01-12 ENCOUNTER — Telehealth (HOSPITAL_BASED_OUTPATIENT_CLINIC_OR_DEPARTMENT_OTHER): Admitting: Internal Medicine

## 2021-01-12 NOTE — Telephone Encounter (Signed)
The hospital in [facility] didn't receive the MRI order. I double checked the fax number with the pt and re-sent    [phone]

## 2021-01-12 NOTE — Telephone Encounter (Signed)
Called [facility] to see if they received the fax. They received the fax and they will call the patient to schedule

## 2021-01-17 LAB — UNMAPPED LAB RESULTS: Creatinine (EXT): 1.4 mg/dL — ABNORMAL HIGH (ref 0.6–1.3)

## 2021-03-02 ENCOUNTER — Telehealth (HOSPITAL_BASED_OUTPATIENT_CLINIC_OR_DEPARTMENT_OTHER): Admitting: Internal Medicine

## 2021-03-02 NOTE — Telephone Encounter (Signed)
Patient called the office to report there are 3 to 4 days ago he had recurrence of a similar abdominal pain episode that he had in the past.  He had been on a keto diet for around 10 days and states he is eating "too much chicken" and then he had some bacon.  He had epigastric discomfort with vomiting.  He took Donnatal which she had had leftover and felt somewhat better the next day although still somewhat fatigued.  The following day he decided to eat some [place] and bread had some mild recurrence of the symptoms.  He has been taking Advil 400 mg every 6 hours for chronic neck pain that he will be having cervical fusion for at [facility] in the coming weeks.  He restarted omeprazole 20 mg daily which I given him in the past for similar symptoms.  He also took 1 Zithromax that he had leftover that he might have something bacterial.  Overall he feels as if he has some kind of "inflammatory" issue going on.  Today and yesterday he has felt "weak and out of it".  He denies any cough.  He denies any fever.  He has no abdominal pain today.  His bowels are moving.  He did dip his urine as look quite yellow and it was found to be concentrated with 1+ leukocytes.  He denies any dysuria.  This certainly could have been an episode of colic.  He may have irritated the lining of the stomach and perhaps this was a bit of gastritis given the amount of anti-inflammatories he has been taking.  I suggest he continue omeprazole 20 mg once daily.  He should follow a bland diet.  He should avoid Advil for now.  I do not think he needs to continue on Zithromax.  He will let me know how things are going in a few days.  I asked him to send me some office notes concerning his upcoming three-level cervical fusion.

## 2021-03-04 LAB — CMP (EXT)
ALT/SGPT (EXT): 630 U/L — ABNORMAL HIGH (ref 5–45)
AST/SGOT (EXT): 70 U/L — ABNORMAL HIGH (ref 5–34)
Albumin (EXT): 4 g/dL (ref 3.5–5.0)
Alkaline Phosphatase (EXT): 132 U/L (ref 35–150)
Anion Gap (EXT): 10 mmol/L (ref 2–11)
BUN (EXT): 35 mg/dL — ABNORMAL HIGH (ref 8–26)
Bilirubin, Total (EXT): 1 mg/dL (ref 0.2–1.2)
CO2 (EXT): 23 mmol/L (ref 22–29)
CalciumCalcium (EXT): 9.6 mg/dL (ref 8.4–10.3)
Chloride (EXT): 108 mmol/L (ref 98–108)
Creatinine (EXT): 1.5 mg/dL — ABNORMAL HIGH (ref 0.7–1.3)
Glucose (EXT): 90 mg/dL (ref 70.0–100.0)
Potassium (EXT): 4.6 mmol/L (ref 3.5–5.1)
Protein (EXT): 7.5 g/dL (ref 6.4–8.3)
Sodium (EXT): 141 mmol/L (ref 135.0–145.0)
eGFR - Creat MDRD (EXT): >60 mL/min/{1.73_m2}

## 2021-03-04 LAB — HEMOGLOBIN A1C
Estimated Average Glucose mg/dL (INT/EXT): 105 mg/dL (ref 77–114)
Estimated Avg Glucose, External: 105 mg/dL (ref 77–114)
HEMOGLOBIN A1C % (INT/EXT): 5.3 % (ref 4.3–5.6)
Hemoglobin A1C, External: 5.3 % (ref 4.3–5.6)

## 2021-03-27 LAB — CMP (EXT)
ALT/SGPT (EXT): 48 U/L — ABNORMAL HIGH (ref 5–45)
AST/SGOT (EXT): 35 U/L — ABNORMAL HIGH (ref 5–34)
Albumin (EXT): 4 g/dL (ref 3.5–5.0)
Alkaline Phosphatase (EXT): 57 U/L (ref 35–150)
Anion Gap (EXT): 11 mmol/L (ref 2–11)
BUN (EXT): 20 mg/dL (ref 8–26)
Bilirubin, Total (EXT): 0.8 mg/dL (ref 0.2–1.2)
CO2 (EXT): 18 mmol/L — ABNORMAL LOW (ref 22–29)
CalciumCalcium (EXT): 9 mg/dL (ref 8.4–10.3)
Chloride (EXT): 109 mmol/L — ABNORMAL HIGH (ref 98–108)
Creatinine (EXT): 1.3 mg/dL (ref 0.7–1.3)
Glucose (EXT): 196 mg/dL — ABNORMAL HIGH (ref 70.0–100.0)
Potassium (EXT): 4.8 mmol/L (ref 3.5–5.1)
Protein (EXT): 7.6 g/dL (ref 6.4–8.3)
Sodium (EXT): 138 mmol/L (ref 135.0–145.0)
eGFR - Creat MDRD (EXT): >60 mL/min/{1.73_m2}

## 2021-03-28 LAB — BMP (EXT)
Anion Gap (EXT): 12 mmol/L — ABNORMAL HIGH (ref 2–11)
BUN (EXT): 18 mg/dL (ref 8–26)
CO2 (EXT): 23 mmol/L (ref 22–29)
CalciumCalcium (EXT): 9.3 mg/dL (ref 8.4–10.3)
Chloride (EXT): 107 mmol/L (ref 98–108)
Creatinine (EXT): 1.3 mg/dL (ref 0.7–1.3)
Glucose (EXT): 117 mg/dL — ABNORMAL HIGH (ref 70.0–100.0)
Potassium (EXT): 3.9 mmol/L (ref 3.5–5.1)
Sodium (EXT): 142 mmol/L (ref 135.0–145.0)
eGFR - Creat MDRD (EXT): >60 mL/min/{1.73_m2}

## 2021-06-18 ENCOUNTER — Encounter

## 2021-06-18 ENCOUNTER — Encounter (HOSPITAL_BASED_OUTPATIENT_CLINIC_OR_DEPARTMENT_OTHER)

## 2021-06-19 ENCOUNTER — Encounter (HOSPITAL_BASED_OUTPATIENT_CLINIC_OR_DEPARTMENT_OTHER): Admitting: Internal Medicine

## 2021-06-22 NOTE — Patient Instructions (Signed)
 I spoke with patient.  He continues to be symptomatic in multiple areas including tremors, muscle fasciculations, brain fog, problems concentrating, memory problems, interrupted sleep, weakness and numbness of his right lower extremity.  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  He will likely redo the 24-hour urine collection as recommended by poison control.  Ultimately he is concerned he has underlying progressive neurologic disease such as MS, or other.  He was seen by Dr. Iran Planas in neurology last year concerned about ALS.  Dr. Iran Planas did not feel that that was a likely possibility but did suggest an EMG.  Dr. Iran Planas mentioned referral with Dr. Donnajean Lopes given her specialty and demyelinating disorders.  The patient could be in [place] next week to have an EMG, +/- MRIs, +/- an appoint with Dr. Donnajean Lopes.  Let the patient know I cannot make these diagnoses for him but I can try to get him set up with neurology.  Of note, his right foot felt almost 100% better yesterday and feels about 80% better today.  He is able to continue to work full-time he denies any major deficits from his symptoms.  He does have a lot of worry about his symptoms.  I have been in touch with the neurology department  Put out a text to Dr. Donnajean Lopes.

## 2021-06-24 ENCOUNTER — Encounter (HOSPITAL_BASED_OUTPATIENT_CLINIC_OR_DEPARTMENT_OTHER)

## 2021-06-24 NOTE — Patient Instructions (Signed)
 I spoke with Dr. Donnajean Lopes.  She will see the patient next week.  She has they ordered an MRI of the brain, cervical and T-spine for demyelinating disease.

## 2021-06-25 ENCOUNTER — Telehealth (HOSPITAL_BASED_OUTPATIENT_CLINIC_OR_DEPARTMENT_OTHER): Admitting: Internal Medicine

## 2021-06-25 ENCOUNTER — Encounter (HOSPITAL_BASED_OUTPATIENT_CLINIC_OR_DEPARTMENT_OTHER)

## 2021-06-25 NOTE — Telephone Encounter (Signed)
 I will route this note to Dr. Donnajean Lopes who has requested these imaging studies.

## 2021-06-25 NOTE — Telephone Encounter (Signed)
 I called [facility] prior authorization spoke with Janine Limbo was approved 06/25/21 - 08/24/2021 for MRI Brain auth# [id number], MRI Cervical Soine auth# [id number], MRI Thoracic auth# [id number] with a case number : [id number].

## 2021-06-25 NOTE — Telephone Encounter (Signed)
 Brandon [name] w/customer care  [name]  [phone]      Brandon [name] needs a call by tomorrow,  patient is coming in Saturday.. stats request by radiology /urgent     [employer] insurance requires pa for all 3 scans.Marland Kitchen which are for the brain, cervical and spine..   Tax: [id number]

## 2021-06-26 ENCOUNTER — Encounter (HOSPITAL_BASED_OUTPATIENT_CLINIC_OR_DEPARTMENT_OTHER): Admitting: Nephrology

## 2021-06-26 MED ORDER — diazePAM (Valium) 5 mg tablet
5 | ORAL_TABLET | ORAL | 0 refills | 30.00000 days | Status: AC
Start: 2021-06-26 — End: ?

## 2021-06-26 MED ORDER — diazePAM (Valium) 5 mg tablet
5 | ORAL_TABLET | ORAL | 0 refills | 30.00000 days | Status: DC
Start: 2021-06-26 — End: 2021-06-26

## 2021-06-26 NOTE — Progress Notes (Signed)
 To have MRI would like to have Valium

## 2021-06-29 ENCOUNTER — Telehealth (HOSPITAL_BASED_OUTPATIENT_CLINIC_OR_DEPARTMENT_OTHER): Admitting: Internal Medicine

## 2021-06-29 ENCOUNTER — Encounter (HOSPITAL_BASED_OUTPATIENT_CLINIC_OR_DEPARTMENT_OTHER): Admitting: Internal Medicine

## 2021-06-29 NOTE — Telephone Encounter (Signed)
 Please call back.  [facility] send over a critical support image of cervical lumbar. [facility] need confirmation it was received.

## 2021-06-29 NOTE — Telephone Encounter (Signed)
 We need confirmation that reports where received ... cervical spine and geraffic spine and should had been received on 10/22.

## 2021-06-30 ENCOUNTER — Encounter

## 2021-06-30 ENCOUNTER — Other Ambulatory Visit

## 2021-06-30 ENCOUNTER — Encounter (HOSPITAL_BASED_OUTPATIENT_CLINIC_OR_DEPARTMENT_OTHER): Admitting: Neurology

## 2021-06-30 ENCOUNTER — Ambulatory Visit
Admit: 2021-06-30 | Discharge: 2021-06-30 | Payer: PRIVATE HEALTH INSURANCE | Attending: Neurology | Primary: Internal Medicine

## 2021-06-30 VITALS — BP 154/85 | HR 87 | Ht 68.0 in | Wt 179.0 lb
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
## 2021-07-22 ENCOUNTER — Other Ambulatory Visit (HOSPITAL_BASED_OUTPATIENT_CLINIC_OR_DEPARTMENT_OTHER): Admitting: Internal Medicine

## 2021-08-11 LAB — BMP (EXT)
Anion Gap (EXT): 10 mmol/L (ref 2–11)
BUN (EXT): 32 mg/dL — ABNORMAL HIGH (ref 8–26)
CO2 (EXT): 20 mmol/L — ABNORMAL LOW (ref 22–29)
CalciumCalcium (EXT): 10.2 mg/dL (ref 8.4–10.3)
Chloride (EXT): 108 mmol/L (ref 98–108)
Creatinine (EXT): 1.6 mg/dL — ABNORMAL HIGH (ref 0.7–1.3)
Glucose (EXT): 97 mg/dL (ref 70.0–100.0)
Potassium (EXT): 5 mmol/L (ref 3.5–5.1)
Sodium (EXT): 138 mmol/L (ref 135.0–145.0)
eGFR - Creat MDRD (EXT): 57 mL/min/{1.73_m2}

## 2021-08-12 ENCOUNTER — Encounter

## 2021-08-12 LAB — LIPID PROFILE (EXT)
Cholesterol (EXT): 205 mg/dL — ABNORMAL HIGH (ref <200)
HDL Cholesterol (EXT): 34 mg/dL — ABNORMAL LOW (ref >=40)
LDL Cholesterol, CALC (EXT): 122 mg/dL — ABNORMAL HIGH (ref <100)
NON HDL Cholesterol (EXT): 171 mg/dL
Triglycerides (EXT): 246 mg/dL — ABNORMAL HIGH (ref <150)
VLDL Cholesterol (EXT): 49 mg/dL — ABNORMAL HIGH (ref 0–30)

## 2021-08-12 LAB — HEMOGLOBIN A1C
Estimated Average Glucose mg/dL (INT/EXT): 103 mg/dL (ref 77–114)
Estimated Avg Glucose, External: 103 mg/dL (ref 77–114)
HEMOGLOBIN A1C % (INT/EXT): 5.2 % (ref 4.3–5.6)
Hemoglobin A1C, External: 5.2 % (ref 4.3–5.6)

## 2021-09-09 ENCOUNTER — Encounter

## 2021-10-29 ENCOUNTER — Telehealth (HOSPITAL_BASED_OUTPATIENT_CLINIC_OR_DEPARTMENT_OTHER)

## 2021-10-29 NOTE — Telephone Encounter (Signed)
Brandon [name] is calling to see if he needs an updated TDAP.  Today,while closing a gait with rusty latch, sustained small abrasion on ,cuticle of index finger left hand.  Superficial,no puncture wound ,bled a bit ,no pain.Cleaned wound .  Most recent TDAP October 2018.   I reassured Brandon [name],no need to repeat TDAP at this time.  Updated Dr Mindi JunkerSpector

## 2022-04-19 NOTE — Progress Notes (Signed)
Mallory - please send Rx for MRI to [facility] at pt request in portal. THx

## 2022-06-15 NOTE — Progress Notes (Signed)
Formatting of this note is different from the VI Brian Reese  [id number]  October 20, 1965  06/15/2022    Chief Complaint   Patient presents with    Establish Care     HPI:     Pleasant new patient here to establish care.     CAD:  2019 atypical chest pain, nuc stress test was possibly slightly abnormal, had CTA with 95% stenosis of LAD, had 1 stent placed proximal LAD.   Lipitor  caused transaminitis    Switched to 40 crestor which he stopped to see if that would improve MSK problem below.  Switched to praluent and just transitioned this month to [employer] preference.  Plan to check lipids in 2 months after new med started.   Father CAD as well, 2 MI's. 60s  Would like to est care here with cardiology     RCC:  S/p partial nephrectomy, curative   Was an incidental finding while looking for stones on [place]    CKD:  Would like nephrology consult here  creatinine runs 1.2-1.3 chronically and now 1.3-1.4 after the surgery     Multiple MSK/connective tissue complaints since about 2019:  Started with a finger injury - he Caught it on a piece of furniture and and got a mallet finger , then a series of torn ligaments to the wrist (twice while just reaching to scratch his back), bad ACL sprain from playing soccer with child - very minimal force injury.   Then walking across street and torn ligament in foot while just walking.    Continues to sprain things very easily, dealing with a lot of pain  Lumbar radiculopathy, degenerative lumbar findings as well.   Saw rheumatology here and also a rheumatology colleague up [place], but did not think that it was inflammatory - may decide to f/u with rheum again in the future to revisit.     Have suggested he consider genetics consult, EDS rule out... does not run in family (although possibly son may have hypermobile EDS). Brian is not hypermobile at all, never was     Possible long covid  Miriad of problems that started about 6 weeks after son had covid.  Khary never  developed any respiratory symptoms so never tested himself, but he developed many problems all at once around this time.   Tremors in toe (fasciculations), numbness progressed along his foot, other tremors - saw a motor neuron specialist in [place] early on and was told most likely a nervous problem, or a tic.    Developed crushing fatigue which persists today  Some brain fog, memory problems.  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   MS has been r/o with   MRIs  Brain imaging has been normal in other aspects as well, per pt.     Getting some visual changes, coming and going.   everything is fuzzy, right eye deficit, gets better as day goes on  Saw [facility], healthy exam, recommended corneal mapping  Will reach out to them for scheduling - may need to see one of ours since he was at [facility] in [place], saw Dr Pierre Bali in the past     OSA:  not yet on CPAP.  Lost 20 pounds and has noted improved symptoms - plans  to do sleep study soon.     Mild BPH,   Saw palmetto, flomax.  Seems to be better when avoiding red meat actually - currently not on med    GERD, occasional difficulty swallowing at LES.  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  Enjoys this.     HM, Male:  Colon Cancer Screening: UTD - will have him sign.  Left after 5 today, will get it next time he is here when front staff here   No results found for: "PSA"  Flu Shot: recommend annually  Shingrix:    Pneumo: 66  TDaP:          ROS:  As per HPI, all others negative    Family History   Problem Relation Age of Onset    Hypertension Mother     Cataracts Mother     Hypertension Father     Diabetes Father     Cataracts Father     Lupus Neg  Hx     Cancer Neg Hx     Blindness Neg Hx     Glaucoma Neg Hx     Macular degeneration Neg Hx     Retinal detachment Neg Hx      Social History     Tobacco Use    Smoking status: Never    Smokeless tobacco: Never   Substance Use Topics    Alcohol use: Never    Drug use: Never     Past Surgical History:   Procedure Laterality Date    CORONARY STENT PLACEMENT      NEPHRECTOMY      PR ARTHRODESIS ANT INTERBODY INC DISCECTOMY, CERVICAL BELOW C2 N/A 03/26/2021    Procedure: 3 level ACDF;  Surgeon: Carmie End, MD;  Location: [facility] MAIN OR;  Service: Neurosurgery     Current Outpatient Medications:     amLODIPine (NORVASC) 5 mg tablet, Take 1 tablet by mouth daily., Disp: , Rfl:     lisinopriL (Prinivil) 40 mg tablet, Take 1 tablet by mouth daily., Disp: 30 tablet, Rfl: 11    triamcinolone acetonide (Kenalog) 0.025 % lotion, , Disp: , Rfl:     alirocumab (Praluent) 150 mg/mL subcutaneous pen injector, Inject 1 mL under the skin every 14 days for 28 days., Disp: 2 mL, Rfl: 0  Review of patient's allergies indicates:   Allergen Reactions    Penicillins Anaphylaxis and Other (See Comments)     Flushing dizziness      PHYSICAL EXAMINATION:   BP 131/80   Pulse 84   Temp 36.8 C (98.2 F)   Ht 172.7 cm (5\' 8" )   Wt 79.4 kg (175 lb)   SpO2 96%   BMI 26.61 kg/m   General: No acute distress   Eye: Normal conjunctiva.  HENT: normocephalic, moist mucus membranes, no gross abnormalities  Respiratory: no visible distress, normal respiration rate  Cardiovascular: Regular rate, Normal peripheral perfusion, No edema.  Musculoskeletal: No swelling, Normal gait.  Integumentary: Warm, Dry, No rash, no visible lesions  Neurologic: Alert, Oriented, No gross focal defects, Speech clear and coherent.  Psychiatric: Cooperative, Appropriate affect.    ASSESSMENT/PLAN:     1. Coronary artery disease due to lipid rich plaque  We'll get him est with cardiology.  No active complaints.   Checking lipids  2 months after switching to repatha   -  CARDIOLOGY    2. Chronic kidney disease, unspecified CKD stage  Update labs and we'll have him est with nephrology   Avoids NSAIDs, no smoking, no alcohol  - Amb referral to Nephrology    3. Renal cell cancer  Per HPI, doing well     4. Connective tissue anomaly  Unsure etiology  Consider f/u with rheum  Consider genetics consult  Will continue to track    5. Right wrist pain  Per HPI, we'll have him consult with hand.   - Amb referral to Hand Surgery    6. Fatigue, unspecified type  The Fatigue, brain fog, memory concerns, all started after son had covid.  Possibly had asymptomatic covid, possibly a log covid type syndrome.  Does have likely OSA and has sleep study which he will complete soon and stressed importance of treating that.    Could possibly consider a stimulant trial, although with caution given his coronary disease.  Would prefer to get OSA under control and reassess first.   Consider neuropsych consult/testing.   He did see nuerology,  We'll check B12, folate, TSH as well.     7. Encounter for screening for malignant neoplasm of prostate    8. Calculus of kidney     Time Factor:  The total amount of time spent was 75-80 minutes spent in review of records, consultation and examination of patient and in documentation.     Electronically signed by Jilda Roche, PA at 06/15/2022 10:49 PM EDT

## 2022-06-24 NOTE — Progress Notes (Signed)
Formatting of this note is different from the original.  [facility]  Hand Surgery Service    Chief Complaint:  Right wrist pain    History of Present Illness: Brian Reese is 56 y.o.  male, who is a new patient to me for evaluation of right wrist pain.  He is seen in consultation by request of Ninfa Linden. He reports 05/27/2022 he injured his right wrist after hyperextending it doing some stretches.  He felt as if the limiting gave at that time. His pain is currently 0/10.  He is not currently wearing any bracing or taking OTC NSAIDs for pain.  This is his first injury to this wrist.  However he endorses multiple ligamentous injuries over the past 1 to 2 years following relatively low energy mechanisms. He also provides a video of his right hand which shows triggering of his small finger in the mornings but he states this typically resolves as the day goes on.  He has been tested for rheumatoid arthritis, Sjogren's, and underwent of the testing which is all been negative at this point.  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  Inquires about her thoughts on his multiple ligamentous injuries as well as his right wrist.  He does not smoke.  He does not have diabetes.    Medical History:  Past medical history, surgical history, family history, social history, medications, and allergies have been reviewed are are documented in the [facility] medical record for this visit.    Past Medical History:   Diagnosis Date    CAD (coronary artery disease)     Dry eyes     Hypertension     Renal cell carcinoma     Renal mass 06/13/2018    Formatting of this note might be different from the original.  Added automatically from request for surgery [id number]    TIA (transient ischemic attack) 08/11/2021     Past Surgical History:   Procedure Laterality Date    CORONARY STENT PLACEMENT      NEPHRECTOMY      PR ARTHRODESIS ANT INTERBODY INC DISCECTOMY, CERVICAL BELOW C2 N/A 03/26/2021     Procedure: 3 level ACDF;  Surgeon: Mallory Shirk, MD;  Location: [facility] MAIN OR;  Service: Neurosurgery     Current Outpatient Medications:     amLODIPine (NORVASC) 5 mg tablet, Take 1 tablet by mouth daily., Disp: , Rfl:     aspirin 81 mg delayed release enteric coated tablet, Take 1 tablet by mouth daily., Disp: , Rfl:     evolocumab (REPATHA SYRINGE SUBQ), Inject under the skin., Disp: , Rfl:     lisinopriL (Prinivil) 40 mg tablet, Take 1 tablet by mouth daily., Disp: 30 tablet, Rfl: 11    triamcinolone acetonide (Kenalog) 0.025 % lotion, , Disp: , Rfl:     Penicillins    Social History     Socioeconomic History    Marital status: Married     Spouse name: Not on file    Number of children: Not on file    Years of education: Not on file    Highest education level: Not on file   Occupational History    Not on file   Tobacco Use    Smoking status: Never    Smokeless tobacco: Never   Substance and Sexual Activity    Alcohol use: Never    Drug use: Never    Sexual activity: Not on file  Other Topics Concern    Not on file   Social History Narrative    Not on file     Social Determinants of Health     Financial Resource Strain: Not on file   Food Insecurity: Not on file   Transportation Needs: Not on file   Physical Activity: Not on file   Stress: Not on file   Social Connections: Not on file   Housing Stability: Not on file     Family History   Problem Relation Age of Onset    Hypertension Mother     Cataracts Mother     Hypertension Father     Diabetes Father     Cataracts Father     Lupus Neg Hx     Cancer Neg Hx     Blindness Neg Hx     Glaucoma Neg Hx     Macular degeneration Neg Hx     Retinal detachment Neg Hx      Review of Systems:  The review of systems is documented on the scanned new patient form.   I have reviewed this, and pertinent positives are detailed in the HPI.    QuickDASH    Open a tight or new jar.     Do heavy household chores.     Carry a shopping bag or briefcase.     Wash your back.      Use a knife to cut food.     Perform recreational activities.     Problem interfered with your normal social activities.     Problem limited your work or regular daily activities.     Arm, shoulder or hand pain over past week.     Tingling in your arm, shoulder, or hand over past week.     Difficulty Sleeping because of arm, shoulder, hand problem.       Score:       Has your pain:     Has your function:     Surgeon satisfaction:     Please rate your pain:       Physical Exam:   Ht 172.7 cm (5\' 8" )   Wt 79.4 kg (175 lb)   BMI 26.61 kg/m     General:  No acute distress.  Psych:  Answers questions appropriately, normal affect   Neuro:  Awake, alert, and oriented x3.   HEENT:  Head normocephalic atraumatic.  Pulm:  Nonlabored breathing.  Cardiovascular:  Pulse regular rate and rhythm.  Heme:  No significant clubbing or cyanosis.  Lymph:  No significant generalized edema.  Musculoskeletal:    Focused examination of the Right upper extremity: Hand is warm well perfused with brisk capillary refill.  Palpable radial pulse.  Sensation intact to light touch throughout the median radial ulnar nerve distribution. TTP with deep palpation of the wrist nearest the TFCC. There is full symmetrical wrist hand and digit range of motion.  The patient is able to make a full composite fist.  They have 5 out of 5 grip strength, 5 out of 5 APB and 5/5 interossei muscle testing.  The patient is nontender to palpation at the A1 pulleys throughout their hand.  The patient has a negative Tinel's, Phalens, and Durkan's compression test at the wrist.  There are no wounds lacerations abrasions or any other cutaneous manifestations.    Focused examination of the Left upper extremity: Hand is warm well perfused with brisk capillary refill.  Palpable radial pulse.  Sensation intact to light  touch throughout the median radial ulnar nerve distribution.  There is full symmetrical wrist hand and digit range of motion.  The patient is able to make  a full composite fist.  They have 5 out of 5 grip strength, 5 out of 5 APB and 5/5 interossei muscle testing.  The patient is nontender to palpation at the A1 pulleys throughout their hand.  The patient has a negative Tinel's, Phalens, and Durkan's compression test at the wrist.  There are no wounds lacerations abrasions or any other cutaneous manifestations.    Imaging:  XR My Review 06/24/22 - No acute osseous abnormalities    Assessment:  Right wrist pain    Plan:  His wrist pain has essentially resolved at this point. He is not wearing any braces nor does he have any mechanical deficits. We briefly discussed the natural history and progression of his injury. At this current time we will plan for non-operative management in the form of NSAIDs and Bracing PRN. We will see [place] back on an as needed basis. No additonal questions/concerns at this time.    Norva Pavlov, MD, MPH, PGY-2  Department of [facility]  Pager: [place]     Patient was seen and examined with the resident.  I agree with his/her findings as above.    My additional comments are:     [x]  I agree with their note as documented.   []  As amended above.   []  Additional comments as follows: This appears to be a SL sprain but he has no plain film signs of SL instability.  He would like to continue with conservative treatment for now and he would like to seek out a connective tissue disease specialist clinic, I expressed my thoughts regarding the possibilities or not of finding something.    Time Factor Billing:  The total amount of time spent with the patient was 60 min with greater than 50% spent in consultation, education, and coordination of care.     Charles A. Duffy Bruce, MD  Hand, Shoulder, and Elbow Surgery  Assistant Professor of Orthopaedics  Director of [facility]    Electronically signed by Elsie Lincoln, MD at 06/25/2022  7:54  PM EDT

## 2022-06-30 LAB — HEMOGLOBIN A1C
HEMOGLOBIN A1C % (INT/EXT): 5.2 % (ref 4.3–5.6)
Hemoglobin A1C, External: 5.2 % (ref 4.3–5.6)

## 2022-06-30 LAB — PSA (EXT): PSA (EXT): 1.56 ng/mL (ref 0.00–4.00)

## 2022-07-27 NOTE — Progress Notes (Signed)
Formatting of this note is different from the original.    Subjective:     HPI:      Mr. 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 Patient activity: walking, bike riding.        History is obtained today from Patient only. C patient noticed about 3 weeks ago he had a severe incidence of right ankle pain.  He was doing better about 5 days later.  Note he does not have any gout symptoms in the past.  He is concerned about an x-ray which demonstrated possible talar osteochondral lesion.    The patient has tried rest, CAM boot to treat their pain without symptoms relief.    Symptoms:    Pain/symptom timing:  Worse during the day when active  Pain/symptom context:  Worse with activites and work  Pain/symptom modifying factors:  Rest makes better, activities make worse  Pain/symptom associated signs/symptoms: none    Past Medical, Surgical and Social History:  Past Medical History:  has a past medical history of CAD (coronary artery disease), Dry eyes, Hypertension, Renal cell carcinoma, Renal mass (06/13/2018), and TIA (transient ischemic attack) (08/11/2021).  Problem List: does not have any pertinent problems on file.  Past Surgical History:  has a past surgical history that includes Nephrectomy; Coronary stent placement; and pr arthrodesis ant interbody inc discectomy, cervical below c2 (N/A, 03/26/2021).  Family History: Denies relevant family history.  Social History:  reports that he has never smoked. He has never used smokeless tobacco. He reports that he does not drink alcohol and does not use drugs.  Current Medications: has a current medication list which includes the following prescription(s): amlodipine, aspirin, evolocumab, lisinopril, and triamcinolone acetonide.  Allergies: is allergic to penicillins.     Objective:     Vital Signs  There were no vitals taken for this visit.    Physical Exam    MUSCULOSKELETAL:   Focused  exam of bilateral lower extremities reveals:    Hindfoot alignment: physiologic valgus  Forefoot alignment: neutral    Pulses: 2+ DP/PT pulses  Sensation: intact to light touch in Superficial peroneal, deep peroneal, sural, saphenous, tibial distributions  Motor: Dorsiflexion 5/5, Plantarflexion 5/5, Inversion 5/5, Eversion 5/5    Focused exam of the RLE reveals tenderness to palpation over the medial sesamoid and plantar 1st MTP. Nontender to palpation over the dorsal or medial eminence of the 1st MTP, 1st metatarsal, 1st TMT, remainder of the plantar 2-5th MTP joints. 1st MTP dorsiflexion 60 degrees and painless, plantarflexion 30 degrees and somewhat painful. Negative grind test. Negative Lachman of the great toe. + Anterior drawer    IMAGING  Plain films of the right foot were obtained by me and independently viewed and independently interpreted. These demonstrate plantigrade alignment, no obvious acute pathology.    Assessment:     1. Ankle pain, unspecified chronicity, unspecified laterality    2. Plantar fasciitis of left foot      Plan:     Brian Reese is a 56 year old male who is atraumatic ankle pain over a period of a couple of days.  His imaging does demonstrate a possible osteochondral lesion of the medial talar dome.  He would like to start physical therapy and get an ASO brace.  Will also check a uric acid level.    Medical Decision Making:  Diagnosis and treatment plans discussed with patient and family,  both operative and nonoperative, as well as the role of prescription mediations, and associated risks, and they appear to understand. The patient understands exam findings and treatment plan. Questions were solicited and were answered.    I performed independent review of the imaging as the radiology read was not yet available.    Brian Osmond, PA-C  Foot & Ankle Surgery  Department of Orthopaedics  [facility]  Electronically signed by Irene Shipper, MD at 07/27/2022  12:54 PM EST

## 2022-07-27 NOTE — Progress Notes (Signed)
Formatting of this note might be different from the original.    Ortho Tech Note    07/27/2022    Fetterman, Khamarion  [id number]         Applied Large ASO on Right ankle for ankle pain.       Educated on how to apply and adjust the item.  Sent home with instructions.    Informed patient the item is covered under insurance.  How much they'll pay, if they'll pay, we do not know.  Sent an e-mail of the Terms of Acceptance.    Patient was made aware of the [employer] return policy for the item. Policy states if they return the item within 14 days we will make sure insurance does not bill the patient for the item.           Mattie Sturmer      Electronically signed by Cathlean Marseilles at 07/27/2022 11:53 AM EST

## 2022-10-18 NOTE — Telephone Encounter (Signed)
Formatting of this note might be different from the original.  ----- Message from Florene Glen, MD PhD sent at 10/06/2022  3:29 PM EST -----  Mild OSA - not inspire candidate - can work with pulm for CPAP or if wants oral appliance - generally send to [employer]    ----- Message -----  From: Mike Gip  Sent: 10/06/2022   2:47 PM EST  To: Florene Glen, MD PhD    Home sleep study posted      Electronically signed by Carrington Clamp, RN at 10/18/2022 11:52 AM EST

## 2022-10-18 NOTE — Telephone Encounter (Signed)
Formatting of this note might be different from the original.  Mychart message sent to patient with sleep study results  Electronically signed by Carrington Clamp, RN at 10/18/2022 11:55 AM EST

## 2022-10-29 NOTE — Telephone Encounter (Signed)
Formatting of this note might be different from the original.  Spoke with Brian Reese to remind him about appt with Dr. Annamarie Major next week and to get labs drawn before appt.   Electronically signed by Jeremy Johann, RN at 10/29/2022 11:17 AM EST

## 2022-11-03 LAB — MICROALBUMIN URINE (EXT)
Creatinine, 24hr Urine (EXT): 77 mg/dL
MICROALBUMIN, URINE (EXT): 14.9 mg/L
Microalbumin/Creatinine Ratio Urine (EXT): 19.4 mg/g (ref 0.0–30.0)

## 2022-11-04 NOTE — Progress Notes (Signed)
Formatting of this note is different from the original.  [name], [place] [address]    Patient:  Brian Reese, Brian Reese  MRN:  [id number]  Date of Birth:  09-28-1965    Reason for Consult  Chief Complaint   Patient presents with    Chronic Kidney Disease     History of Present Illness  Mr. Wice is here today for elevated serum creatinine 65 yM with hypertension, CAD, HLD, s/p partial R nephrectomy for renal cell carcinoma (MRI follow up 10/23/20 no  evidence of recurrence)   Hypertension controlled. CAD. S/p CTA 2019, has done well thereafter.     Last BPs at [facility]  BP Readings from Last 3 Encounters:   11/04/22 (!) 141/92   06/15/22 131/80   08/14/21 133/84     Last 3 Weights at [facility] Readings from Last 3 Encounters:   11/04/22 83.8 kg (184 lb 11.2 oz)   06/24/22 79.4 kg (175 lb)   06/15/22 79.4 kg (175 lb)     Cr trend at [facility]  Lab Results   Component Value Date    CREATININE 1.6 (H) 11/03/2022    CREATININE 1.3 06/30/2022    CREATININE 1.6 (H) 08/11/2021    CREATININE 1.3 03/27/2021    CREATININE 1.3 03/26/2021    CREATININE 1.5 (H) 03/04/2021    CREATININE 1.4 (H) 01/17/2021    CREATININE 1.5 (H) 06/07/2020       Past Medical History:   Past Medical History:   Diagnosis Date    CAD (coronary artery disease)     Dry eyes     Hypertension     Renal cell carcinoma     Renal mass 06/13/2018    Formatting of this note might be different from the original.  Added automatically from request for surgery [id number]    TIA (transient ischemic attack) 08/11/2021     Past Surgical History  Past Surgical History:   Procedure Laterality Date    CORONARY STENT PLACEMENT      NEPHRECTOMY      PR ARTHRODESIS ANT INTERBODY INC DISCECTOMY, CERVICAL BELOW C2 N/A 03/26/2021    Procedure: 3 level ACDF;  Surgeon: Carmie End, MD;  Location: [facility] MAIN OR;  Service: Neurosurgery     Medications    Current Outpatient Medications:     amLODIPine (NORVASC) 5 mg tablet, Take 1 tablet by mouth daily., Disp: ,  Rfl:     aspirin 81 mg delayed release enteric coated tablet, Take 1 tablet by mouth daily., Disp: , Rfl:     evolocumab (REPATHA SYRINGE SUBQ), Inject under the skin., Disp: , Rfl:     lisinopriL (Prinivil) 40 mg tablet, Take 1 tablet by mouth daily., Disp: 30 tablet, Rfl: 11    diazePAM (Valium) 5 mg tablet, Take 1 tablet by mouth daily as needed (MRI). (Patient not taking: Reported on 11/04/2022), Disp: 2 tablet, Rfl: 0    triamcinolone acetonide (Kenalog) 0.025 % lotion, , Disp: , Rfl:     Social:   Social History     Tobacco Use    Smoking status: Never    Smokeless tobacco: Never   Substance Use Topics    Alcohol use: Never    Drug use: Never     Family History:   Family History   Problem Relation Age of Onset    Hypertension Mother     Cataracts Mother     Hypertension Father  Diabetes Father     Cataracts Father     Lupus Neg Hx     Cancer Neg Hx     Blindness Neg Hx     Glaucoma Neg Hx     Macular degeneration Neg Hx     Retinal detachment Neg Hx      ROS denies chest pain, change in DOE cough sputum production, rash  Long Covid but much better now    Physical Exam  BP (!) 141/92   Pulse 81   Temp 37 C (98.6 F)   Resp 18   Ht 172.7 cm ('5\' 8"'$ )   Wt 83.8 kg (184 lb 11.2 oz)   SpO2 100%   BMI 28.08 kg/m   Repeat BP R arm  sitting at rest 127/83 Repeat BP 135/80    Alert, no obvious distress  HEENT:  Sclera non-icteric, PERRL, no thrush  Lungs:  CTA w/o rales, rhonchi or wheezes.  No increase in WOB  Heart: RRR w/o MRG.  No peripheral edema.   Abd:  Soft, NT, no flank pain scar  Neuro:  PERRL, No gross motor deficits, speech fluent  Psych:  Affect appropriate, mood appropriate  Skin:  No rash  Heme:  No ecchymoses noted     Labs:  Chemistry  Lab Results   Component Value Date    CREATININE 1.6 (H) 11/03/2022    BUN 45 (H) 11/03/2022    NA 138.2 11/03/2022    K 4.5 11/03/2022    CL 107 11/03/2022     No components found for: "CO2"  Lab Results   Component Value Date    GLUCOSE 94.8 11/03/2022      Hematology  Lab Results   Component Value Date    WBC 8.10 11/03/2022    HGB 14.9 11/03/2022    HCT 44.1 11/03/2022    MCV 88.1 11/03/2022    PLT 184 11/03/2022     Lab Results   Component Value Date    IRON 72 11/03/2022    TIBC 370 11/03/2022    FERRITIN 585.4 (H) 11/03/2022     Mineral bone disorders  Lab Results   Component Value Date    PTH 64.6 11/03/2022    CALCIUM 9.7 11/03/2022     25OH vitamin D  Results for orders placed or performed in visit on 11/03/22   VITAMIN D 25 HYDROXY   Result Value Ref Range    Vitamin D 25-Hydroxy 18.9 (L) 30.0 - 80.0 ng/mL     Urine studies  UA  Lab Results   Component Value Date    GLUCOSEU Negative 11/03/2022    PROTEINUA <6.8 11/03/2022    PROTEINUA Negative 11/03/2022    BILIRUBINUR Negative 11/03/2022    UROBILINOGEN 0.2 11/03/2022    LABPH 5.5 11/03/2022    BLOODU Negative 11/03/2022    KETONESU Negative 11/03/2022    NITRITE Negative 11/03/2022    LEUKOCYTESUR Negative 11/03/2022    LABSPEC 1.025 11/03/2022    COLORU Yellow 11/03/2022     ASSESSMENT and PLAN  CKD G3aA1  Due to partial nephrectomy hypertension  Plan BP control     Progression    he should avoid NSAIDS.    HTN   At or near goal   Plan home BP checks twice weekly  Consider adding a diuretic if BP not ideally controlled    Anemia  Hgb At goal     MBD  Vit D deficient  Plan Vit D therapy  CKD education  discussed     Labs were discussed with the patient and an After Visit Summary was printed and given to the patient.    RTC one year with RFP, CBC, iron studies, intact PTH, UA with spot urine protein and spot urine creatinine    Fredirick Lathe, MD      Electronically signed by Sherrie Sport, MD at 11/04/2022  5:27 PM EST

## 2022-11-09 LAB — LIPID PROFILE (EXT)
Cholesterol (EXT): 236 mg/dL — ABNORMAL HIGH (ref <200)
HDL Cholesterol (EXT): 39 mg/dL — ABNORMAL LOW (ref >=40)
LDL Cholesterol, CALC (EXT): 170 mg/dL — ABNORMAL HIGH (ref <100)
NON HDL Cholesterol (EXT): 197 mg/dL
Triglycerides (EXT): 137 mg/dL (ref <150)
VLDL Cholesterol (EXT): 27 mg/dL — ABNORMAL HIGH (ref 7–24)

## 2023-03-02 LAB — LIPID PROFILE (EXT)
Cholesterol (EXT): 349 mg/dL — ABNORMAL HIGH (ref <200)
HDL Cholesterol (EXT): 33 mg/dL — ABNORMAL LOW (ref >=40)
LDL Cholesterol, CALC (EXT): 267 mg/dL — ABNORMAL HIGH (ref <100)
NON HDL Cholesterol (EXT): 316 mg/dL
Triglycerides (EXT): 247 mg/dL — ABNORMAL HIGH (ref <150)
VLDL Cholesterol (EXT): 49 mg/dL — ABNORMAL HIGH (ref 7–24)

## 2023-03-30 ENCOUNTER — Emergency Department: Payer: BLUE CROSS/BLUE SHIELD | Primary: Family Medicine

## 2023-03-30 ENCOUNTER — Inpatient Hospital Stay
Admit: 2023-03-30 | Discharge: 2023-04-01 | Disposition: A | Discharge status: Home / Self Care | Payer: BLUE CROSS/BLUE SHIELD | Admitting: Internal Medicine

## 2023-03-30 DIAGNOSIS — I25110 Atherosclerotic heart disease of native coronary artery with unstable angina pectoris: Secondary | ICD-10-CM

## 2023-03-30 DIAGNOSIS — R0789 Other chest pain: Secondary | ICD-10-CM

## 2023-03-30 LAB — MAGNESIUM: Magnesium: 2 mg/dL (ref 1.6–2.6)

## 2023-03-30 LAB — BASIC METABOLIC PANEL
Anion Gap: 9 mmol/L (ref 2–17)
BUN: 31 mg/dL — ABNORMAL HIGH (ref 6–20)
CO2: 25 mmol/L (ref 22–29)
Calcium: 9.6 mg/dL (ref 8.5–10.7)
Chloride: 105 mmol/L (ref 98–107)
Creatinine: 1.4 mg/dL — ABNORMAL HIGH (ref 0.7–1.3)
Est, Glom Filt Rate: 59 mL/min/1.73m — ABNORMAL LOW (ref >=60)
Glucose: 99 mg/dL (ref 70–99)
Osmolaliy Calculated: 284 mOsm/kg (ref 270–287)
Potassium: 4.4 mmol/L (ref 3.5–5.3)
Sodium: 139 mmol/L (ref 135–145)

## 2023-03-30 LAB — CK: Total CK: 122 U/L (ref 20–200)

## 2023-03-30 LAB — CBC WITH AUTO DIFFERENTIAL
Basophils %: 0.4 % (ref 0.0–2.0)
Basophils Absolute: 0 10*3/uL (ref 0.0–0.2)
Eosinophils %: 2.9 % (ref 0.0–7.0)
Eosinophils Absolute: 0.3 10*3/uL (ref 0.0–0.5)
Hematocrit: 45.5 % (ref 38.0–52.0)
Hemoglobin: 15.7 g/dL (ref 13.0–17.3)
Immature Grans (Abs): 0.03 10*3/uL (ref 0.00–0.06)
Immature Granulocytes %: 0.4 % (ref 0.0–0.6)
Lymphocytes Absolute: 2.4 10*3/uL (ref 1.0–3.2)
Lymphocytes: 28.2 % (ref 15.0–45.0)
MCH: 29.9 pg (ref 27.0–34.5)
MCHC: 34.5 g/dL (ref 30.0–36.0)
MCV: 86.7 fL (ref 84.0–100.0)
MPV: 10.2 fL (ref 7.0–12.2)
Monocytes %: 9.4 % (ref 4.0–12.0)
Monocytes Absolute: 0.8 10*3/uL (ref 0.3–1.0)
NRBC Absolute: 0 10*3/uL (ref 0.000–0.012)
NRBC Automated: 0 % (ref 0.0–0.2)
Neutrophils %: 58.7 % (ref 42.0–74.0)
Neutrophils Absolute: 5 10*3/uL (ref 1.6–7.3)
Platelets: 187 10*3/uL (ref 140–440)
RBC: 5.25 x10e6/mcL (ref 4.00–5.60)
RDW: 13 % (ref 10.0–17.0)
WBC: 8.5 10*3/uL (ref 3.8–10.6)

## 2023-03-30 LAB — TSH WITH REFLEX: TSH: 1.11 mcIU/mL (ref 0.358–3.740)

## 2023-03-30 LAB — HEPATIC FUNCTION PANEL
ALT: 32 U/L (ref 0–50)
AST: 18 U/L (ref 0–50)
Albumin: 4.4 g/dL (ref 3.5–5.2)
Alk Phosphatase: 53 U/L (ref 40–130)
Bilirubin, Direct: <0.2 mg/dL (ref 0.00–0.30)
Total Bilirubin: 0.97 mg/dL (ref 0.00–1.20)
Total Protein: 7.3 g/dL (ref 5.7–8.3)

## 2023-03-30 LAB — PROTIME-INR
INR: 1.1 — ABNORMAL LOW (ref 1.5–3.5)
Protime: 13.5 seconds (ref 11.6–14.5)

## 2023-03-30 LAB — D-DIMER, QUANTITATIVE: D-Dimer, Quant: 0.26 CD:295178345 (ref 0.19–0.50)

## 2023-03-30 LAB — TROPONIN
Troponin, High Sensitivity: 21 ng/L (ref 0–22)
Troponin, High Sensitivity: 17 ng/L (ref 0–22)
Troponin, High Sensitivity: 13 ng/L (ref 0–22)

## 2023-03-30 MED ORDER — POTASSIUM CHLORIDE CRYS ER 20 MEQ PO TBCR
20 | ORAL | Status: DC | PRN
Start: 2023-03-30 — End: 2023-04-01

## 2023-03-30 MED ORDER — SODIUM CHLORIDE 0.9 % IV BOLUS
0.9 | Freq: Once | INTRAVENOUS | Status: AC
Start: 2023-03-30 — End: 2023-03-30
  Administered 2023-03-30: 20:00:00 1000 mL via INTRAVENOUS

## 2023-03-30 MED ORDER — SODIUM CHLORIDE 0.9 % IV SOLN
0.9 % | INTRAVENOUS | Status: AC | PRN
Start: 2023-03-30 — End: 2023-04-01

## 2023-03-30 MED ORDER — ENOXAPARIN SODIUM 40 MG/0.4ML IJ SOSY
400.4 MG/0.4ML | Freq: Every day | INTRAMUSCULAR | Status: AC
Start: 2023-03-30 — End: 2023-04-01
  Administered 2023-03-30: 23:00:00 40 mg via SUBCUTANEOUS

## 2023-03-30 MED ORDER — ACETAMINOPHEN 650 MG RE SUPP
650 | Freq: Four times a day (QID) | RECTAL | Status: DC | PRN
Start: 2023-03-30 — End: 2023-04-01

## 2023-03-30 MED ORDER — LISINOPRIL 20 MG PO TABS
20 | Freq: Every day | ORAL | Status: DC
Start: 2023-03-30 — End: 2023-04-01
  Administered 2023-03-31 – 2023-04-01 (×2): 40 mg via ORAL

## 2023-03-30 MED ORDER — POTASSIUM BICARB-CITRIC ACID 20 MEQ PO TBEF
20 MEQ | ORAL | Status: AC | PRN
Start: 2023-03-30 — End: 2023-04-01

## 2023-03-30 MED ORDER — NITROGLYCERIN 0.4 MG SL SUBL
0.4 | SUBLINGUAL | Status: DC | PRN
Start: 2023-03-30 — End: 2023-04-01

## 2023-03-30 MED ORDER — ONDANSETRON 4 MG PO TBDP
4 MG | Freq: Three times a day (TID) | ORAL | Status: AC | PRN
Start: 2023-03-30 — End: 2023-04-01

## 2023-03-30 MED ORDER — MAGNESIUM SULFATE 2 GM/50ML IV SOLN
250 GM/50ML | INTRAVENOUS | Status: AC | PRN
Start: 2023-03-30 — End: 2023-04-01

## 2023-03-30 MED ORDER — POLYETHYLENE GLYCOL 3350 17 G PO PACK
17 g | Freq: Every day | ORAL | Status: AC | PRN
Start: 2023-03-30 — End: 2023-04-01

## 2023-03-30 MED ORDER — NORMAL SALINE FLUSH 0.9 % IV SOLN
0.9 | INTRAVENOUS | Status: DC | PRN
Start: 2023-03-30 — End: 2023-04-01

## 2023-03-30 MED ORDER — NEBIVOLOL HCL 5 MG PO TABS
5 | Freq: Once | ORAL | Status: DC
Start: 2023-03-30 — End: 2023-03-30

## 2023-03-30 MED ORDER — ESCITALOPRAM OXALATE 10 MG PO TABS
10 MG | Freq: Every day | ORAL | Status: AC
Start: 2023-03-30 — End: 2023-04-01

## 2023-03-30 MED ORDER — ASPIRIN 81 MG PO TBEC
81 | Freq: Every day | ORAL | Status: DC
Start: 2023-03-30 — End: 2023-04-01
  Administered 2023-03-30 – 2023-04-01 (×3): 81 mg via ORAL

## 2023-03-30 MED ORDER — ASPIRIN 81 MG PO CHEW
81 | Freq: Once | ORAL | Status: DC
Start: 2023-03-30 — End: 2023-03-30

## 2023-03-30 MED ORDER — POTASSIUM CHLORIDE 10 MEQ/100ML IV SOLN
10100 MEQ/0ML | INTRAVENOUS | Status: AC | PRN
Start: 2023-03-30 — End: 2023-04-01

## 2023-03-30 MED ORDER — NORMAL SALINE FLUSH 0.9 % IV SOLN
0.9 | Freq: Two times a day (BID) | INTRAVENOUS | Status: DC
Start: 2023-03-30 — End: 2023-04-01
  Administered 2023-03-31: 12:00:00 10 mL via INTRAVENOUS

## 2023-03-30 MED ORDER — MORPHINE SULFATE 4 MG/ML IV SOLN
4 MG/ML | Freq: Once | INTRAVENOUS | Status: AC
Start: 2023-03-30 — End: 2023-04-01

## 2023-03-30 MED ORDER — CARVEDILOL 3.125 MG PO TABS
3.125 | Freq: Two times a day (BID) | ORAL | Status: DC
Start: 2023-03-30 — End: 2023-04-01
  Administered 2023-03-30 – 2023-03-31 (×2): 3.125 mg via ORAL

## 2023-03-30 MED ORDER — ACETAMINOPHEN 325 MG PO TABS
325 | Freq: Four times a day (QID) | ORAL | Status: DC | PRN
Start: 2023-03-30 — End: 2023-03-31

## 2023-03-30 MED ORDER — ASPIRIN 81 MG PO CHEW
81 | Freq: Once | ORAL | Status: AC
Start: 2023-03-30 — End: 2023-03-30
  Administered 2023-03-30: 20:00:00 81 mg via ORAL

## 2023-03-30 MED ORDER — ONDANSETRON HCL 4 MG/2ML IJ SOLN
4 | Freq: Four times a day (QID) | INTRAMUSCULAR | Status: DC | PRN
Start: 2023-03-30 — End: 2023-04-01

## 2023-03-30 MED FILL — ASPIRIN 81 MG PO CHEW: 81 MG | ORAL | Qty: 1

## 2023-03-30 MED FILL — ENOXAPARIN SODIUM 40 MG/0.4ML IJ SOSY: 40 MG/0.4ML | INTRAMUSCULAR | Qty: 0.4

## 2023-03-30 MED FILL — CARVEDILOL 3.125 MG PO TABS: 3.125 MG | ORAL | Qty: 1

## 2023-03-30 MED FILL — SM ASPIRIN ADULT LOW STRENGTH 81 MG PO TBEC: 81 MG | ORAL | Qty: 1

## 2023-03-30 NOTE — ED Provider Notes (Signed)
[facility] EMERGENCY DEPT  EMERGENCY DEPARTMENT ENCOUNTER      Pt Name: Brian Reese  MRN: [id number]  Birthdate 07/12/1966  Date of evaluation: 03/30/2023  Provider: Veverly Fells, MD  Provider evaluation time: 03/30/23 1347    CHIEF COMPLAINT       Chief Complaint   Patient presents with    Dizziness     Per patient he has had chest pain on and off for the last week. Patient states that he has prior cardiac history and has a stent placed. Patient states today he is no longer having chest pain but is having dizziness.         HISTORY OF PRESENT ILLNESS      HPI    Brian Reese is a 57 y.o. male with history of HTN, HLD, and CAD s/p stent in the LAD who presents with complaint of chest discomfort off and on for the last week along with increased lightheadedness.  He states the chest discomfort feels like a tightness like he cannot get comfortable.  It does not seem specifically exertional or pleuritic.  He has had no recent travel or surgeries.  He has been feeling increased fatigue as well as lightheadedness.  He is established with a cardiologist in [place], Dr. Cyndia Skeeters.  He had issues with statin medications so is on Repatha but lipid panel recently was much worse.  His previous stent was placed in 2019.  He took 3 baby aspirins today.      Nursing Notes were reviewed.    REVIEW OF SYSTEMS      Review of Systems    Except as noted above the remainder of the review of systems was reviewed and is negative.     PAST MEDICAL HISTORY   No past medical history on file.    SURGICAL HISTORY     No past surgical history on file.    CURRENT MEDICATIONS       Previous Medications    ASPIRIN 81 MG EC TABLET    Take 1 tablet by mouth daily    ESCITALOPRAM (LEXAPRO) 10 MG TABLET    1 tablet Orally Once a day for 30 day(s)    LISINOPRIL (PRINIVIL;ZESTRIL) 40 MG TABLET    1 tablet Orally Once a day for 30 day(s)    REPATHA SURECLICK 140 MG/ML SOAJ    Inject 140 mg into the skin every 14 days       ALLERGIES     Penicillin  g    FAMILY HISTORY     No family history on file.     SOCIAL HISTORY       Social History     Socioeconomic History    Marital status: Unknown       SCREENINGS       Glasgow Coma Scale  Eye Opening: Spontaneous  Best Verbal Response: Oriented  Best Motor Response: Obeys commands  Glasgow Coma Scale Score: 15             CIWA Assessment  BP: (!) 148/96  Pulse: 80             PHYSICAL EXAM      ED Triage Vitals [03/30/23 1346]   BP Temp Temp Source Pulse Respirations SpO2 Height Weight - Scale   (!) 176/104 98 F (36.7 C) Oral 99 18 96 % 1.727 m (5\' 8" ) 82.6 kg (182 lb)       Physical Exam  Vitals and nursing note  reviewed.   HENT:      Head: Normocephalic and atraumatic.   Eyes:      Extraocular Movements: Extraocular movements intact.      Pupils: Pupils are equal, round, and reactive to light.   Neck:      Trachea: Trachea normal.   Cardiovascular:      Rate and Rhythm: Normal rate and regular rhythm.      Pulses: Normal pulses.      Heart sounds: Normal heart sounds.   Pulmonary:      Effort: Pulmonary effort is normal.      Breath sounds: Normal breath sounds.   Abdominal:      Palpations: Abdomen is soft.      Tenderness: There is no abdominal tenderness.   Musculoskeletal:      Cervical back: Neck supple.      Right lower leg: No edema.      Left lower leg: No edema.   Skin:     General: Skin is warm and dry.   Neurological:      General: No focal deficit present.      Mental Status: He is alert and oriented to person, place, and time.   Psychiatric:         Behavior: Behavior normal. Behavior is cooperative.         DIAGNOSTIC RESULTS     EKG: All EKG's are interpreted by the Emergency Department Physician who either signs or Co-signs this chart in the absence of a cardiologist.    See ED course.    RADIOLOGY:   Non-plain film images such as CT, Ultrasound and MRI are read by the radiologist. Plain radiographic images are visualized and preliminarily interpreted by the emergency physician with the below  findings:    See ED course.    Interpretation per the Radiologist below, if available at the time of this note:    XR CHEST PORTABLE    (Results Pending)       LABS:  Labs Reviewed   BASIC METABOLIC PANEL - Abnormal; Notable for the following components:       Result Value    BUN 31 (*)     Creatinine 1.4 (*)     Est, Glom Filt Rate 59 (*)     All other components within normal limits   PROTIME-INR - Abnormal; Notable for the following components:    INR 1.1 (*)     All other components within normal limits   CBC WITH AUTO DIFFERENTIAL   TROPONIN   TROPONIN   MAGNESIUM   D-DIMER, QUANTITATIVE   HEPATIC FUNCTION PANEL   CK   TSH WITH REFLEX       All other labs were within normal range or not returned as of this dictation.    EMERGENCY DEPARTMENT COURSE / MDM:     VITALS:    Vitals:    03/30/23 1358 03/30/23 1411 03/30/23 1433 03/30/23 1503   BP: (!) 172/96  (!) 157/89 (!) 148/96   Pulse: 92  87 80   Resp: 18  16 16    Temp:       TempSrc:       SpO2: 98% 96% 96% 97%   Weight:       Height:         Orthostatic Vitals:      03/30/2023     2:11 PM   Orthostatic Vitals   Orthostatic B/P and Pulse? Yes   Blood Pressure Lying 172/96  Pulse Lying 89 PER MINUTE   Blood Pressure Standing 166/103   Pulse Standing 93 PER MINUTE         ED MEDICATIONS:  Medications   sodium chloride 0.9 % bolus 1,000 mL (has no administration in time range)   aspirin chewable tablet 81 mg (has no administration in time range)       PROCEDURES:  Procedures    CONSULTS:  IP CONSULT TO CARDIOLOGY    ED Course as of 03/30/23 1605   Wed Mar 30, 2023   1359 12-lead EKG reviewed and interpreted by myself.   @1346 : 99 bpm, NSR with slight QRS widening 102 ms, otherwise normal intervals.  No acute ST/T-wave abnormalities noted. [CD]   1424 LAD stent 2019.  Cardiologist Dr. Cyndia Skeeters [facility]) [CD]   1452 D-Dimer, Quant: 0.26 [CD]   1452 Troponin, High Sensitivity: 21 [CD]   1532 Dr. Laveda Norman thinks patient would be okay for an outpatient stress unless  increasing troponin.  May stay for observation and AM stress if patient prefers this; otherwise they will get him in the office ASAP.  Recommends starting on Bystolic 5mg  qd. [CD]   1541 Troponin, High Sensitivity: 17  Delta trop -4 [CD]   1558 Patient declines CXR [CD]   1601 Signed out to Dr. Sabino Gasser pending patient decision regarding observation versus outpatient follow-up with Bystolic Rx. [CD]      ED Course User Index  [CD] Veverly Fells, MD       Medical Decision Making  Problems Addressed:  Chest discomfort: acute illness or injury that poses a threat to life or bodily functions    Amount and/or Complexity of Data Reviewed  External Data Reviewed: labs and ECG.     Details: High-sensitivity troponin was <2 on 08/11/21 at [facility].  Labs: ordered. Decision-making details documented in ED Course.  Radiology: ordered.  ECG/medicine tests: ordered and independent interpretation performed. Decision-making details documented in ED Course.  Discussion of management or test interpretation with external provider(s): Cardiology    Risk  OTC drugs.  Prescription drug management.  Decision regarding hospitalization.        FINAL IMPRESSION      1. Chest discomfort          DISPOSITION/PLAN   DISPOSITION          PATIENT REFERRED TO:  No follow-up provider specified.    DISCHARGE MEDICATIONS:  New Prescriptions    No medications on file     Controlled Substances Monitoring:          No data to display                (Please note that portions of this note were completed with a voice recognition program.  Efforts were made to edit the dictations but occasionally words are mis-transcribed.)    Veverly Fells, MD (electronically signed)  Attending Emergency Physician        Veverly Fells, MD  03/30/23 [phone]

## 2023-03-30 NOTE — ED Provider Notes (Signed)
I took over care for this patient at shift change.  I spoke with the patient at length and he is decided that he wants to stay for chest pain rule out and a stress test in the morning.  Dr. Malen Gauze and Dr. Laveda Norman both aware of the patient.  The plan is to perform a stress test in the morning.  Patient does have a left ACL injury will need a nuclear stress test.  Patient is hemodynamically stable and ready for admission.  Dr. Trilby Drummer has accepted the patient for admission.     Joselyn Glassman, MD  03/30/23 [phone]

## 2023-03-30 NOTE — Progress Notes (Signed)
Advance Care Planning   Admit Date:  03/30/2023  1:44 PM   Name:  Brian Reese   Age:  57 y.o.  Sex:  male  DOB:  08-13-1966   MRN:  [id number]   Room:  ED09/09    [name] has capacity to make his own decisions:   Yes    If pt unable to make decisions, POA/surrogate decision maker:  Spouse    Other people present:   None    Patient / surrogate decision-maker directed code status:  Full Code    Other ACP topics discussed, if applicable:   None    Patient or surrogate consented to discussion of the current conditions, workup, management plans, prognosis, and the risk for further deterioration.  Time spent: 17 minutes in direct discussion.      Signed:  Trilby Drummer, MD

## 2023-03-30 NOTE — H&P (Signed)
[facility] Hospitalist Service History & Physical    Chief Complaint:  Dizziness (Per patient he has had chest pain on and off for the last week. Patient states that he has prior cardiac history and has a stent placed. Patient states today he is no longer having chest pain but is having dizziness.)     Reason for Admission:  Chest pain   History Obtained From:  patient    History of Present Illness   The patient is a 57 y.o. male with significant past medical history of coronary disease status post stent in LAD (2019), hypertension, hyperlipidemia, CKD stage IIIa, anxiety and OSA who presents with chest pain.  Patient states for the past week he has been having off and on chest discomfort.  When the chest pain started he states that last 5 minutes.  He does not report any relieving or aggravating factors with this chest pain.  When the chest pain is occurring he states he notices at times with radiation down the left arm and nausea.  When his symptoms did not resolve he decided to come to the ER for evaluation.  Patient does have significant family history of coronary artery disease with his mother and father both having MIs.    In the ER his troponins and EKG are not indicative of ACS.    Past Medical History    has no past medical history on file.     Past Surgical History    has no past surgical history on file.     Medications Prior to Admission     Current Outpatient Medications   Medication Instructions    aspirin 81 mg, Oral, DAILY    escitalopram (LEXAPRO) 10 MG tablet 1 tablet Orally Once a day for 30 day(s)    lisinopril (PRINIVIL;ZESTRIL) 40 MG tablet 1 tablet Orally Once a day for 30 day(s)    Repatha SureClick 140 mg, SubCUTAneous, EVERY 14 DAYS       Allergies:    Penicillin g    Social History      Patient currently lives with family wife    Family History   family history is not on file.     Review of Systems   A complete review of systems was performed and were negative unless mentioned in the  HPI.    Physical Exam   BP (!) 167/92   Pulse 89   Temp 98 F (36.7 C) (Oral)   Resp 20   Ht 1.727 m (5\' 8" )   Wt 82.6 kg (182 lb)   SpO2 97%   BMI 27.67 kg/m   Body mass index is 27.67 kg/m.   O2 Device: None (Room air)  General: Alert and oriented, no acute distress.  HEENT: Normal hearing, moist mucous membranes, good dentition. Anicteric sclera, conjunctiva pink.  Lungs: Clear to auscultation bilaterally, non-labored breathing.   Heart: Regular rate & rhythm, no murmurs, rubs or gallops.   Abdomen: Soft, non-tender, non-distended, normal bowel sounds.   Extremities: 2+ DP pulses bilaterally, no peripheral edema. No joint swelling or effusion   Skin: No visible or palpable rashes or lesions.   Neurologic: Awake, alert, and oriented X3, moving all extremities equally.   Psychiatric: Cooperative, appropriate mood and affect.      LABS:  LABS: CBC:   Lab Results   Component Value Date/Time    WBC 8.5 03/30/2023 01:59 PM    RBC 5.25 03/30/2023 01:59 PM    HGB 15.7 03/30/2023 01:59 PM  HCT 45.5 03/30/2023 01:59 PM    MCV 86.7 03/30/2023 01:59 PM    MCH 29.9 03/30/2023 01:59 PM    MCHC 34.5 03/30/2023 01:59 PM    RDW 13.0 03/30/2023 01:59 PM    PLT 187 03/30/2023 01:59 PM     BMP:    Lab Results   Component Value Date/Time    GLUCOSE 99 03/30/2023 01:59 PM    NA 139 03/30/2023 01:59 PM    K 4.4 03/30/2023 01:59 PM    CL 105 03/30/2023 01:59 PM    CO2 25 03/30/2023 01:59 PM    ANIONGAP 9 03/30/2023 01:59 PM    BUN 31 03/30/2023 01:59 PM    CREATININE 1.4 03/30/2023 01:59 PM    CALCIUM 9.6 03/30/2023 01:59 PM    LABGLOM 59 03/30/2023 01:59 PM    GFRAA 61 12/12/2020 11:07 AM       IMAGING:  No results found.  No results found for this or any previous visit.       Assessment and Plan   57 y.o. male who presents with chest pain    Chest pain  Coronary artery disease  -Symptoms are concerning but no outward evidence of ACS  - Will admit patient and placed on telemetry  - Patient reports that he recently tore his  ACL and will be unable to perform an exercise stress test so would prefer a chemical stress test  - Patient is intolerant of statins and is on Repatha  - Will continue aspirin    Hypertension  - Will continue lisinopril and Norvasc    CKD stage IIIa  History of renal cell carcinoma status post left nephrectomy  - Baseline creatinine 1.4-1.5  - Will Monitor    Anxiety  - Will continue Lexapro    OSA  - States wife will bring in his CPAP machine but will order 1 just in case she is unable to bring it    DVT prophylaxis:     [x]  Lovenox                                  []  Subcut heparin                                  []  Already on Anticoagulation                                  []  SCDs            []  Encourage ambulation, low risk for DVT   Dispo: Patient will be placed into observation status because I expect he will need at least 2 MN <48 hr of care in the hospital for chest pain rule out  Code status: I discussed with patient/family at bedside and patient's code status is No Order   Medical Decision maker: Extended Emergency Contact Information  Primary Emergency Contact: chung,margaret  Home Phone: [phone]  Relation: Spouse   PCP: Jillyn Hidden, MD     For any questions, please contact Team C    This note was created using voice recognition software and may contain typographic errors missed during final review. The intent is to have a complete and accurate medical record.  As a valued partner in this safety effort, if you have noted factual  errors, please complete the Health Information Amendment/Correct Form or call the [facility] Health Information Management Office at [phone].

## 2023-03-31 DIAGNOSIS — R0789 Other chest pain: Secondary | ICD-10-CM

## 2023-03-31 LAB — HEMOGLOBIN A1C
Estimated Avg Glucose: 108
Estimated Avg Glucose: 115
Hemoglobin A1C: 5.4 % (ref 4.0–6.0)

## 2023-03-31 LAB — CBC
Hematocrit: 41.5 % (ref 38.0–52.0)
Hemoglobin: 14.5 g/dL (ref 13.0–17.3)
MCH: 31.2 pg (ref 27.0–34.5)
MCHC: 34.9 g/dL (ref 30.0–36.0)
MCV: 89.2 fL (ref 84.0–100.0)
MPV: 11.1 fL (ref 7.0–12.2)
NRBC Absolute: 0 10*3/uL (ref 0.000–0.012)
NRBC Automated: 0 % (ref 0.0–0.2)
Platelets: 200 10*3/uL (ref 140–440)
RBC: 4.65 x10e6/mcL (ref 4.00–5.60)
RDW: 13.1 % (ref 10.0–17.0)
WBC: 9.4 10*3/uL (ref 3.8–10.6)

## 2023-03-31 LAB — BASIC METABOLIC PANEL W/ REFLEX TO MG FOR LOW K
Anion Gap: 11 mmol/L (ref 2–17)
BUN: 32 mg/dL — ABNORMAL HIGH (ref 6–20)
CO2: 22 mmol/L (ref 22–29)
Calcium: 9.1 mg/dL (ref 8.5–10.7)
Chloride: 105 mmol/L (ref 98–107)
Creatinine: 1.4 mg/dL — ABNORMAL HIGH (ref 0.7–1.3)
Est, Glom Filt Rate: 59 mL/min/1.73m — ABNORMAL LOW (ref >=60)
Glucose: 104 mg/dL — ABNORMAL HIGH (ref 70–99)
Osmolaliy Calculated: 283 mOsm/kg (ref 270–287)
Potassium: 4.2 mmol/L (ref 3.5–5.3)
Sodium: 138 mmol/L (ref 135–145)

## 2023-03-31 LAB — LIPID PANEL
Chol/HDL Ratio: 9 — ABNORMAL HIGH (ref 0.0–4.4)
Cholesterol, Total: 208 mg/dL — ABNORMAL HIGH (ref 100–200)
HDL: 23 mg/dL — ABNORMAL LOW (ref >=40)
Triglycerides: 650 mg/dL — ABNORMAL HIGH (ref 0–149)
VLDL: 130 mg/dL — ABNORMAL HIGH (ref 5.0–40.0)

## 2023-03-31 LAB — LDL CHOLESTEROL DIRECT, REFLEX: Direct LDL: 96.5 mg/dL (ref 0.00–100.00)

## 2023-03-31 LAB — CARDIAC PROCEDURE: Body Surface Area: 1.99 m2

## 2023-03-31 MED ORDER — MIDAZOLAM HCL 2 MG/2ML IJ SOLN
2 | INTRAMUSCULAR | Status: DC | PRN
Start: 2023-03-31 — End: 2023-03-31
  Administered 2023-03-31 (×2): 1 via INTRAVENOUS
  Administered 2023-03-31: 13:00:00 2 via INTRAVENOUS

## 2023-03-31 MED ORDER — NITROGLYCERIN 2 % TD OINT
2 | Freq: Four times a day (QID) | TRANSDERMAL | Status: DC
Start: 2023-03-31 — End: 2023-04-01
  Administered 2023-03-31 – 2023-04-01 (×4): 1 [in_us] via TOPICAL

## 2023-03-31 MED ORDER — HEPARIN SODIUM (PORCINE) 1000 UNIT/ML IJ SOLN
1000 | INTRAMUSCULAR | Status: DC | PRN
Start: 2023-03-31 — End: 2023-03-31
  Administered 2023-03-31: 13:00:00 5000 via INTRAVENOUS

## 2023-03-31 MED ORDER — NITROGLYCERIN IN D5W 200-5 MCG/ML-% IV SOLN: 200-5 MCG/ML-% | INTRAVENOUS | Status: AC

## 2023-03-31 MED ORDER — ISOSORBIDE MONONITRATE ER 30 MG PO TB24
30 | Freq: Every day | ORAL | Status: DC
Start: 2023-03-31 — End: 2023-04-01
  Administered 2023-04-01: 13:00:00 30 mg via ORAL

## 2023-03-31 MED ORDER — NORMAL SALINE FLUSH 0.9 % IV SOLN
0.9 | Freq: Two times a day (BID) | INTRAVENOUS | Status: DC
Start: 2023-03-31 — End: 2023-04-01
  Administered 2023-04-01 (×2): 10 mL via INTRAVENOUS

## 2023-03-31 MED ORDER — VERAPAMIL HCL 2.5 MG/ML IV SOLN: 2.5 MG/ML | INTRAVENOUS | Status: AC

## 2023-03-31 MED ORDER — SODIUM CHLORIDE 0.9 % IV SOLN
0.9 | INTRAVENOUS | Status: DC
Start: 2023-03-31 — End: 2023-03-31

## 2023-03-31 MED ORDER — MIDAZOLAM HCL 5 MG/5ML IJ SOLN: 5 MG/5ML | INTRAMUSCULAR | Status: AC

## 2023-03-31 MED ORDER — VERAPAMIL HCL 2.5 MG/ML IV SOLN
2.5 | INTRAVENOUS | Status: DC | PRN
Start: 2023-03-31 — End: 2023-03-31
  Administered 2023-03-31: 13:00:00 3 via INTRA_ARTERIAL

## 2023-03-31 MED ORDER — ASPIRIN 81 MG PO TBEC
81 | Freq: Once | ORAL | Status: DC
Start: 2023-03-31 — End: 2023-03-31

## 2023-03-31 MED ORDER — SODIUM CHLORIDE 0.9 % IV SOLN
0.9 | INTRAVENOUS | Status: DC
Start: 2023-03-31 — End: 2023-04-01
  Administered 2023-03-31: 14:00:00 via INTRAVENOUS

## 2023-03-31 MED ORDER — AMLODIPINE BESYLATE 5 MG PO TABS
5 | Freq: Every day | ORAL | Status: DC
Start: 2023-03-31 — End: 2023-04-01
  Administered 2023-03-31 – 2023-04-01 (×2): 5 mg via ORAL

## 2023-03-31 MED ORDER — SODIUM CHLORIDE 0.9 % IV BOLUS
0.9 | INTRAVENOUS | Status: AC | PRN
Start: 2023-03-31 — End: 2023-03-31
  Administered 2023-03-31: 13:00:00 250 via INTRAVENOUS

## 2023-03-31 MED ORDER — FENTANYL 0.05 MG/ML SOLN (MIXTURES ONLY)
0.05 | Status: DC | PRN
Start: 2023-03-31 — End: 2023-03-31
  Administered 2023-03-31 (×3): 25 via INTRAVENOUS

## 2023-03-31 MED ORDER — LIDOCAINE HCL 1 % IJ SOLN: 1 % | INTRAMUSCULAR | Status: AC

## 2023-03-31 MED ORDER — FENTANYL CITRATE (PF) 100 MCG/2ML IJ SOLN: 100 MCG/2ML | INTRAMUSCULAR | Status: AC

## 2023-03-31 MED ORDER — NORMAL SALINE FLUSH 0.9 % IV SOLN
0.9 | INTRAVENOUS | Status: DC | PRN
Start: 2023-03-31 — End: 2023-04-01

## 2023-03-31 MED ORDER — LIDOCAINE HCL 1 % IJ SOLN
1 | INTRAMUSCULAR | Status: DC | PRN
Start: 2023-03-31 — End: 2023-03-31
  Administered 2023-03-31: 13:00:00 3 via INTRADERMAL

## 2023-03-31 MED ORDER — HEPARIN SODIUM (PORCINE) 1000 UNIT/ML IJ SOLN: 1000 UNIT/ML | INTRAMUSCULAR | Status: AC

## 2023-03-31 MED ORDER — ACETAMINOPHEN 325 MG PO TABS
325 | ORAL | Status: DC | PRN
Start: 2023-03-31 — End: 2023-04-01
  Administered 2023-03-31 – 2023-04-01 (×2): 650 mg via ORAL

## 2023-03-31 MED ORDER — ALUM & MAG HYDROXIDE-SIMETH 200-200-20 MG/5ML PO SUSP
200-200-20 | Freq: Four times a day (QID) | ORAL | Status: DC | PRN
Start: 2023-03-31 — End: 2023-04-01

## 2023-03-31 MED ORDER — IOPAMIDOL 76 % IV SOLN
76 | INTRAVENOUS | Status: DC | PRN
Start: 2023-03-31 — End: 2023-03-31
  Administered 2023-03-31: 13:00:00 95 via INTRA_ARTERIAL

## 2023-03-31 MED FILL — XYLOCAINE 1 % IJ SOLN: 1 % | INTRAMUSCULAR | Qty: 20

## 2023-03-31 MED FILL — NITROGLYCERIN IN D5W 200-5 MCG/ML-% IV SOLN: 200-5 MCG/ML-% | INTRAVENOUS | Qty: 250

## 2023-03-31 MED FILL — MIDAZOLAM HCL 5 MG/5ML IJ SOLN: 5 MG/ML | INTRAMUSCULAR | Qty: 5

## 2023-03-31 MED FILL — AMLODIPINE BESYLATE 5 MG PO TABS: 5 MG | ORAL | Qty: 1

## 2023-03-31 MED FILL — TYLENOL 325 MG PO TABS: 325 MG | ORAL | Qty: 2

## 2023-03-31 MED FILL — FENTANYL CITRATE (PF) 100 MCG/2ML IJ SOLN: 100 MCG/2ML | INTRAMUSCULAR | Qty: 2

## 2023-03-31 MED FILL — NITRO-BID 2 % TD OINT: 2 % | TRANSDERMAL | Qty: 1

## 2023-03-31 MED FILL — CARVEDILOL 3.125 MG PO TABS: 3.125 MG | ORAL | Qty: 1

## 2023-03-31 MED FILL — HEPARIN SODIUM (PORCINE) 1000 UNIT/ML IJ SOLN: 1000 UNIT/ML | INTRAMUSCULAR | Qty: 10

## 2023-03-31 MED FILL — LISINOPRIL 20 MG PO TABS: 20 MG | ORAL | Qty: 2

## 2023-03-31 MED FILL — VERAPAMIL HCL 2.5 MG/ML IV SOLN: 2.5 MG/ML | INTRAVENOUS | Qty: 2

## 2023-03-31 MED FILL — NORMAL SALINE FLUSH 0.9 % IV SOLN: 0.9 % | INTRAVENOUS | Qty: 10

## 2023-03-31 MED FILL — SM ASPIRIN ADULT LOW STRENGTH 81 MG PO TBEC: 81 MG | ORAL | Qty: 1

## 2023-03-31 NOTE — Care Coordination-Inpatient (Signed)
03/31/23 1436   Service Assessment   Patient Orientation Alert and Oriented;Place;Situation;Self;Person   Cognition Alert   History Provided By Patient   Primary Caregiver Self   Support Systems Spouse/Significant Other;Children   PCP Verified by CM Yes  Jenelle Mages)   Last Visit to PCP Within last 6 months   Prior Functional Level Independent in ADLs/IADLs   Current Functional Level Independent in ADLs/IADLs   Can patient return to prior living arrangement Yes   Ability to make needs known: Good   Family able to assist with home care needs: Yes   Would you like for me to discuss the discharge plan with any other family members/significant others, and if so, who? Yes  [place])   Social/Functional History   Home Equipment   (CPAP)   Receives Help From Family   ADL Assistance Independent   Homemaking Assistance Independent   Ambulation Assistance Independent   Transfer Assistance Independent   Discharge Planning   Type of Residence House   Living Arrangements Spouse/Significant Other   Current Services Prior To Admission C-pap   Potential Assistance Needed N/A   DME Ordered? No   Potential Assistance Purchasing Medications No     IA completed. IADL's. Lives with spouse and children. No HHC. DME, CPAP. Uses [facility]. Spouse to provide transportation home. No CM needs anticipated. CM available if needs arise prior to discharge.

## 2023-03-31 NOTE — Consults (Signed)
Consultation Note               Date:  March 31, 2023  Patient name: Brian Reese  Date of Birth: 01-Dec-1965    Reason for Consult: Chest pain  Requesting Physician: Dr. 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  He has a history of coronary disease and had PCI in 2019 to the LAD.  Symptoms preceding this were some episodes of exertional chest discomfort he had a stress test which was reportedly unremarkable and then a CTA which showed 95% stenosis which led to PCI.  He has been doing okay since this.  He had some issues tolerating statins.  He recently tore his ACL.  3 presents with episode of chest discomfort rating down his left arm that occurred at rest.  Patient is concerned about his previously false negative stress test and recurrent symptoms.  ER workup was rather unremarkable.  He was admitted to the hospitalist services.      PAST HISTORY      Past Medical History:   has no past medical history on file.    Past Surgical History:   has no past surgical history on file.     Social History:        Family History: family history is not on file.      HOME MEDICATIONS     Prior to Admission medications    Medication Sig Start Date End Date Taking? Authorizing Provider   Multiple Vitamin (MULTIVITAMIN) TABS tablet Take 1 tablet by mouth daily   Yes [provider]   hydrocortisone 0.5 % cream Apply 1 application  topically as needed (Atopic dermatitis)   Yes [provider]   REPATHA SURECLICK 140 MG/ML SOAJ Inject 140 mg into the skin every 14 days 03/28/23  Yes [provider]   aspirin 81 MG EC tablet Take 1 tablet by mouth daily    [provider]   lisinopril (PRINIVIL;ZESTRIL) 40 MG tablet Take 1 tablet by mouth daily    [facility], [facility], MD       Allergies:  Penicillin g    REVIEW OF SYSTEMS:    Review of Systems - General ROS:  negative for - chills, fever, or malaise  Psychological ROS: negative for - anxiety, behavioral disorder, or depression  ENT ROS: negative for - epistaxis, headaches, or visual changes  Endocrine ROS: negative for - malaise/lethargy, temperature intolerance, or unexpected weight changes  Respiratory ROS: negative for - cough, shortness of breath, or wheezing  Cardiovascular ROS: negative for - chest pain, dyspnea on exertion, edema, irregular heartbeat, loss of consciousness, palpitations, or paroxysmal nocturnal dyspnea  Gastrointestinal ROS: negative for - abdominal pain, appetite loss, blood in stools, change in bowel habits, change in stools, constipation, diarrhea, heartburn, or nausea/vomiting  Genito-Urinary ROS: negative for - dysuria or hematuria  Musculoskeletal ROS: negative for - joint pain, joint swelling, muscle pain, or muscular weakness  Neurological ROS: negative for - gait disturbance, numbness/tingling, visual changes, or weakness       PHYSICAL EXAMINATION      BP (!) 147/94   Pulse 64   Temp 98.1 F (36.7 C) (Oral)   Resp 16   Ht 1.727 m (5\' 8" )   Wt 82.6 kg (182 lb)   SpO2 97%   BMI 27.67 kg/m  Physical Exam:  General: Alert, oriented, no acute distress  HEENT: Normocephalic, moist mucous membranes  Neck: no JVD, no stiffness   Cardiac: Regular rate and rhythm no audible murmurs  Respiratory: Clear to auscultation bilaterally  Abdomen: Soft nontender nondistended, normal bowel sounds  Extremities: Without edema, no obvious rashes  Neuro: A&O x3  Musculoskeletal: Normal ROM   Psych: appropriate affect    LABS AND DIAGNOSTICS        CBC:   Recent Labs     03/30/23  1359 03/31/23  0535   WBC 8.5 9.4   HGB 15.7 14.5   PLT 187 200     BMP:    Recent Labs     03/30/23  1359 03/31/23  0535   NA 139 138   K 4.4 4.2   CL 105 105   CO2 25 22   BUN 31* 32*   CREATININE 1.4* 1.4*   GLUCOSE 99 104*   CALCIUM 9.6 9.1     Hepatic:   Recent Labs     03/30/23  1359   AST 18   ALT 32   BILITOT 0.97    ALKPHOS 53                   Troponin: No results for input(s): "TROPONINT" in the last 72 hours.  BNP: No results for input(s): "PROBNP" in the last 72 hours.  INR:   Recent Labs     03/30/23  1359   INR 1.1*       ECG:      ECHO:    No results found for this or any previous visit.      ASSESSMENT / RECOMMENDATIONS        Chest pain: Patient is concerned that he is having unstable symptoms.  His EKG and cardiac enzymes have been unremarkable.  He is requesting a cardiac catheterization.  He cannot walk on a treadmill secondary to a torn ACL.   Uncontrolled hyperlipidemia: Was taken off of statins by his primary cardiologist and started Repatha.   CKD stage 3A: Cr 1.4 hx of renal cell carcinoma and L nephrectomy.     Electronically signed by: Luetta Nutting, PA-C     MD Attestation of Face to Face Encounter and the provision of substantive care:    I have personally performed a face-to-face diagnostic evaluation of the patient. I have reviewed diagnostic testing results. I have read and agree with the care plan as outlined below. The note was initially entered by the APP, however greater than fifty percent  (ie. a substantive portion) of the care of the patient including amending the note, discussing the case with the APP and other providers and examining the patient was performed by myself.  Medical decision making was ultimately performed by myself with input of the above listed providers.    My additional physical exam findings are as follows: Lungs clear, cardiac regular  Additional discussion of the plan: Story concerning but with some atypical features.  EKG and troponins were negative.  Cardiac catheterization revealed insignificant restenosis of the LAD stent.  The patient had transient severe spasm of the LAD that resolved without intervention.  The segment of the LAD distal to the stent.  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  He was previously on amlodipine but was concerned about sexual  dysfunction with it and stopped it on his own several months ago.  We will put him back on amlodipine and add  nitrates also.    Signed by Kathleen Lime, M.D.      NOTE: This report was transcribed using voice recognition software. Every effort was made to ensure accuracy, however, inadvertent computerized transcription errors may be present.

## 2023-03-31 NOTE — Progress Notes (Signed)
[facility] Hospitalist Service     Hospitalist Progress Note     PCP: Jillyn Hidden, MD   Admission Date: 03/30/2023        Subjective   NAD   CP improved with NTG  No acute complaints  Records reviewed   Overnight activity reviewed with nursing staff.       Objective   Blood pressure 134/86, pulse 64, temperature 97.9 F (36.6 C), temperature source Oral, resp. rate 18, height 1.727 m (5\' 8" ), weight 82.6 kg (182 lb), SpO2 98 %.  Body mass index is 27.67 kg/m.      Intake/Output Summary (Last 24 hours) at 03/31/2023 1302  Last data filed at 03/31/2023 0920  Gross per 24 hour   Intake --   Output 3 ml   Net -3 ml         Physical Exam:  General: [Alert, cooperative, non-toxic appearing]  Lungs: [Clear to auscultation without wheeze rales or ronchi. Breathing is non-labored]  Heart: [Normal rate, regular rhythm, no murmur, gallop or edema]  Musculoskeletal: [Warm, well perfused, no joint swelling or tenderness, no pitting edema]  Abdomen: [Soft, non-tender, non-distended, normal bowel sounds, no masses]  Neuro: [NFD]  Mental Status: [Alert & oriented x 3]     Labs and Imaging     Data: I have personally reviewed all lab results and independently reviewed imaging studies performed in the past 24 hours.    Hematologic/Coags Chemistries   Recent Labs     03/30/23  1359 03/31/23  0535   WBC 8.5 9.4   HGB 15.7 14.5   HCT 45.5 41.5   PLT 187 200     Lab Results   Component Value Date/Time    ALBUMIN 4.4 03/30/2023 01:59 PM     No components found for: "HGBA1C"  Lab Results   Component Value Date/Time    INR 1.1 03/30/2023 01:59 PM    PROTIME 13.5 03/30/2023 01:59 PM     No results found for: "APTT"  Lab Results   Component Value Date/Time    DDIMER 0.26 03/30/2023 01:59 PM      Recent Labs     03/30/23  1359 03/31/23  0535   NA 139 138   K 4.4 4.2   CL 105 105   CO2 25 22   BUN 31* 32*   CREATININE 1.4* 1.4*   PROTIME 13.5  --    ALBUMIN 4.4  --    BILITOT 0.97  --    ALKPHOS 53  --    AST 18  --    ALT 32  --      No results for  input(s): "GLU" in the last 72 hours.  No results found for: "CPK", "CKMB", "TROPONINI"  No results found for: "IRON", "FERRITIN"     Inflammatory/Respiratory Diabetes   Lab Results   Component Value Date/Time    CRP <0.30 12/12/2020 11:07 AM     No results found for: "ESR"  ABGs:  No results found for: "PHART", "PO2ART", "HCO3", "PCO2ART"   Lab Results   Component Value Date/Time    LDL Note 03/31/2023 05:35 AM    CREATININE 1.4 03/31/2023 05:35 AM              Studies:  No orders to display       Medications      sodium chloride flush  5-40 mL IntraVENous 2 times per day    nitroglycerin  1 inch Topical 4 times per day  amLODIPine  5 mg Oral Daily    [START ON 04/01/2023] isosorbide mononitrate  30 mg Oral Daily    aspirin  81 mg Oral Daily    escitalopram  10 mg Oral Daily    lisinopril  40 mg Oral Daily    sodium chloride flush  5-40 mL IntraVENous 2 times per day    morphine  4 mg IntraVENous Once    enoxaparin  40 mg SubCUTAneous Daily    carvedilol  3.125 mg Oral BID WC       sodium chloride 125 mL/hr at 03/31/23 0952    sodium chloride        (Note: the above list excludes PRNs)    Assessment & Plan     Hospital Problems             Last Modified POA    * (Principal) Chest pain 03/30/2023 Yes    Coronary artery disease involving native coronary artery of native heart with angina pectoris (HCC) 03/31/2023 Yes    Anxiety 03/30/2023 Yes    Degenerative disc disease, cervical 03/30/2023 Yes    Essential (primary) hypertension 03/30/2023 Yes    Long COVID 03/30/2023 Yes    Hyperlipidemia 03/30/2023 Yes    Stage 3a chronic kidney disease (HCC) (Chronic) 03/31/2023 Yes    OSA (obstructive sleep apnea) (Chronic) 03/30/2023 Yes    Unstable angina (HCC) 03/31/2023 Unknown      Chest pain  Coronary artery disease  -Symptoms are concerning but no outward evidence of ACS  - Will admit patient and placed on telemetry  - Patient reports that he recently tore his ACL and will be unable to perform an exercise stress test so would  prefer a chemical stress test  - Patient is intolerant of statins and is on Repatha  - LHC  - Plan of care pre Cards     Hypertension  - Will continue lisinopril and Norvasc     CKD stage IIIa  History of renal cell carcinoma status post left nephrectomy  - Baseline creatinine 1.4-1.5  - Will Monitor     Anxiety  - Will continue Lexapro     OSA  - States wife will bring in his CPAP machine but will order 1 just in case she is unable to bring it      ADULT DIET; Regular; Low Fat/Low Chol/High Fiber/2 gm Na   Full Code  DVT ppx:     Blanca Friend, MD  03/31/2023 1:02 PM  [facility] Hospitalist Service    ++++++++++++++++++++++++++++++++++++++++    This note was created using voice recognition software and may contain typographic errors missed during final review. The intent is to have a complete and accurate medical record.   As a valued partner in this safety effort, if you have noted factual errors, please contact the [facility] Hospitalist Service at [phone].

## 2023-03-31 NOTE — Pre Sedation (Signed)
Sedation Plan  ASA: class 3 - patient with severe systemic disease     Mallampati class: II - soft palate, uvula, fauces visible.    Sedation plan: local anesthesia and moderate (conscious sedation)    Risks, benefits, and alternatives discussed with patient.        Immediate reassessment prior to sedation:  Patient's status reviewed and vital signs assessed; acceptable to perform procedure and proceed to administer sedation as planned.

## 2023-04-01 MED ORDER — ESCITALOPRAM OXALATE 10 MG PO TABS
10 | ORAL_TABLET | Freq: Every day | ORAL | 3 refills | Status: DC
Start: 2023-04-01 — End: 2023-04-01

## 2023-04-01 MED ORDER — ISOSORBIDE MONONITRATE ER 30 MG PO TB24
30 | ORAL_TABLET | Freq: Every day | ORAL | 3 refills | Status: AC
Start: 2023-04-01 — End: ?

## 2023-04-01 MED ORDER — AMLODIPINE BESYLATE 5 MG PO TABS
5 | ORAL_TABLET | Freq: Every day | ORAL | 3 refills | Status: AC
Start: 2023-04-01 — End: ?

## 2023-04-01 MED ORDER — CARVEDILOL 3.125 MG PO TABS
3.125 | ORAL_TABLET | Freq: Two times a day (BID) | ORAL | 3 refills | Status: AC
Start: 2023-04-01 — End: ?

## 2023-04-01 MED FILL — NORMAL SALINE FLUSH 0.9 % IV SOLN: 0.9 % | INTRAVENOUS | Qty: 10

## 2023-04-01 MED FILL — ENOXAPARIN SODIUM 40 MG/0.4ML IJ SOSY: 40 MG/0.4ML | INTRAMUSCULAR | Qty: 0.4

## 2023-04-01 MED FILL — LISINOPRIL 20 MG PO TABS: 20 MG | ORAL | Qty: 2

## 2023-04-01 MED FILL — ESCITALOPRAM OXALATE 10 MG PO TABS: 10 MG | ORAL | Qty: 1

## 2023-04-01 MED FILL — AMLODIPINE BESYLATE 5 MG PO TABS: 5 MG | ORAL | Qty: 1

## 2023-04-01 MED FILL — TYLENOL 325 MG PO TABS: 325 MG | ORAL | Qty: 2

## 2023-04-01 MED FILL — ISOSORBIDE MONONITRATE ER 30 MG PO TB24: 30 MG | ORAL | Qty: 1

## 2023-04-01 MED FILL — SM ASPIRIN ADULT LOW STRENGTH 81 MG PO TBEC: 81 MG | ORAL | Qty: 1

## 2023-04-01 MED FILL — NITRO-BID 2 % TD OINT: 2 % | TRANSDERMAL | Qty: 1

## 2023-04-01 NOTE — Progress Notes (Signed)
04/01/23 1155   Encounter Summary   Encounter Overview/Reason Initial Encounter   Last Encounter  04/01/23   Begin Time 1154   End Time  1155   Total Time Calculated 1 min     Chaplain briefly introduced himself. Patient said that he was not interested in a visit from the chaplain at this time. Chaplain offered future pastoral care services to the patient, as desired. Recommend a chaplain follow-up some time next week.

## 2023-04-01 NOTE — Care Coordination-Inpatient (Signed)
04/01/23 1228   Discharge Planning   Type of Residence House   Living Arrangements Spouse/Significant Other   Current Services Prior To Admission C-pap   Potential Assistance Needed N/A   DME Ordered? No   Potential Assistance Purchasing Medications No   Type of Home Care Services None   Patient expects to be discharged to: [facility] At/After Discharge   Transition of Care Consult (CM Consult) N/A   Services At/After Discharge None     Patient cleared for discharge. No CM needs per notes or orders. CM available if needs arise prior to discharge.

## 2023-04-01 NOTE — Discharge Summary (Signed)
[facility] Hospitalist Service    Discharge Summary     Patient ID:  Brian Reese [id number] 57 y.o. 1965/12/05    Admit date: 03/30/2023  Discharge date and time: No discharge date for patient encounter.   Admitting Physician: Trilby Drummer, MD   Discharge Physician: Blanca Friend, MD     Discharge Diagnoses   Principal Problem:    Chest pain  Active Problems:    Coronary artery disease involving native coronary artery of native heart with angina pectoris (HCC)    Chest discomfort    Anxiety    Degenerative disc disease, cervical    Essential (primary) hypertension    Long COVID    Hyperlipidemia    Stage 3a chronic kidney disease (HCC)    OSA (obstructive sleep apnea)    Unstable angina (HCC)  Resolved Problems:    * No resolved hospital problems. [facility]     The patient is a 57 y.o. male with significant past medical history of coronary disease status post stent in LAD (2019), hypertension, hyperlipidemia, CKD stage IIIa, anxiety and OSA who presents with chest pain.  Patient states for the past week he has been having off and on chest discomfort.  When the chest pain started he states that last 5 minutes.  He does not report any relieving or aggravating factors with this chest pain.  When the chest pain is occurring he states he notices at times with radiation down the left arm and nausea.  When his symptoms did not resolve he decided to come to the ER for evaluation.  Patient does have significant family history of coronary artery disease with his mother and father both having MIs.   In the ER his troponins and EKG are not indicative of ACS.    Chest pain  Coronary artery disease  -His EKG and cardiac enzymes have been unremarkable.  He is requesting a cardiac catheterization.  He cannot walk on a treadmill secondary to a torn ACL.   - Patient is intolerant of statins and is on Repatha  - LHC with Dr. Delene Loll with vasospam  - Plan of care pre Cards  -c/w ASA, norvasc, Imdur, BBlk  -F/u after  d/c      Hypertension  - Will continue lisinopril and Norvasc     CKD stage IIIa  History of renal cell carcinoma status post left nephrectomy  - Baseline creatinine 1.4-1.5  - Will Monitor     Anxiety  - Will continue Lexapro     OSA  - States wife will bring in his CPAP machine but will order 1 just in case she is unable to bring it        Consults: IP CONSULT TO CARDIOLOGY  IP CONSULT TO CARDIOLOGY    Surgeries/Procedures Performed:  Procedure(s):  Left heart cath / coronary angiography     Recent Labs: BMP:    Lab Results   Component Value Date/Time    GLUCOSE 104 03/31/2023 05:35 AM    NA 138 03/31/2023 05:35 AM    K 4.2 03/31/2023 05:35 AM    CL 105 03/31/2023 05:35 AM    CO2 22 03/31/2023 05:35 AM    ANIONGAP 11 03/31/2023 05:35 AM    BUN 32 03/31/2023 05:35 AM    CREATININE 1.4 03/31/2023 05:35 AM    CALCIUM 9.1 03/31/2023 05:35 AM    LABGLOM 59 03/31/2023 05:35 AM    GFRAA 61 12/12/2020 11:07 AM  Radiology Last 7 Days:  No results found.    Discharge Exam:  Blood pressure 120/66, pulse 58, temperature 97.9 F (36.6 C), temperature source Oral, resp. rate 16, height 1.727 m (5\' 8" ), weight 88.3 kg (194 lb 10.7 oz), SpO2 98 %.   Body mass index is 29.6 kg/m.    Gen: Conversive, Pleasant, Awake, Alert  HEENT: Sclera clear, mucous membranes moist.  Pulmonary: No distress, CTA B/L.  Normal respiratory effort.  Cardiac: Regular rate and rhythm, no murmur, no rub  Abdomen: Non-tender in all quadrants, no guarding, normal bowel sounds, non-distended  Extremities: no deformity, no significant pitting edema  Neuro: Alert and oriented x 3, speech normal  Skin: clean dry intact  Psych: Normal affect, mood    Discharge Plan   Disposition: Home    Provider Follow-Up:   Kathleen Lime, MD  [address]  [address]  [place] [address]  [phone]    Follow up in 2 week(s)         In process/preliminary results:  Outstanding Order Results       Date and Time Order Name Status Description     03/30/2023  1:46 PM EKG 12 Lead Preliminary             Patient Instructions   Diet: cardiac diet  Activity: activity as tolerated    Discharge Medications         Medication List        START taking these medications      amLODIPine 5 MG tablet  Commonly known as: NORVASC  Take 1 tablet by mouth daily  Start taking on: April 02, 2023     carvedilol 3.125 MG tablet  Commonly known as: COREG  Take 1 tablet by mouth 2 times daily (with meals)     isosorbide mononitrate 30 MG extended release tablet  Commonly known as: IMDUR  Take 1 tablet by mouth daily  Start taking on: April 02, 2023            CHANGE how you take these medications      escitalopram 10 MG tablet  Commonly known as: Lexapro  Take 1 tablet by mouth daily  Start taking on: April 02, 2023  What changed: when to take this            CONTINUE taking these medications      aspirin 81 MG EC tablet     hydrocortisone 0.5 % cream     lisinopril 40 MG tablet  Commonly known as: PRINIVIL;ZESTRIL     multivitamin Tabs tablet     Repatha SureClick 140 MG/ML Soaj  Generic drug: Evolocumab               Where to Get Your Medications        These medications were sent to [id number] - [place], [place] - [address] Demetrius Charity [phone] Carmon Ginsberg [phone]  [address] [place] [address]      Phone: [phone]   amLODIPine 5 MG tablet  carvedilol 3.125 MG tablet  escitalopram 10 MG tablet  isosorbide mononitrate 30 MG extended release tablet         Discharge Medications  (summarized)     Current Outpatient Medications   Medication Instructions    [START ON 04/02/2023] amLODIPine (NORVASC) 5 mg, Oral, DAILY    aspirin 81 mg, Oral, DAILY    carvedilol (COREG) 3.125 mg, Oral, 2  TIMES DAILY WITH MEALS    [START ON 04/02/2023] escitalopram (LEXAPRO) 10 mg, Oral, DAILY    hydrocortisone 0.5 % cream 1 application , Topical, PRN    [START ON 04/02/2023] isosorbide mononitrate (IMDUR) 30 mg, Oral, DAILY    lisinopril (PRINIVIL;ZESTRIL) 40 MG tablet  Take 1 tablet by mouth daily    Multiple Vitamin (MULTIVITAMIN) TABS tablet 1 tablet, Oral, DAILY    Repatha SureClick 140 mg, SubCUTAneous, EVERY 14 DAYS       Time Spent on Discharge:  35 minutes were spent on preparing this discharge.  Greater than 50% of this time was spent on direct patient care, or coordination of that care.         Electronically signed by Blanca Friend, MD on 04/01/23 at 12:05 PM EDT

## 2023-04-03 LAB — EKG 12-LEAD
P Axis: 63 degrees
P-R Interval: 146 ms
Q-T Interval: 350 ms
QRS Duration: 102 ms
QTc Calculation (Bazett): 406 ms
R Axis: -7 degrees
T Axis: 27 degrees
Ventricular Rate: 99 {beats}/min

## 2023-04-25 NOTE — Telephone Encounter (Signed)
What is your reason for call? Pt requested an call back from [place] concerning his SOB with exceleration. I got the pt scheduled with Dr. Delene Loll 04/28/23 @ 12:45 PM at [facility] location. Please advise, thank you

## 2023-04-25 NOTE — Telephone Encounter (Signed)
Spoke with patient and he stated that he is about 3.5 weeks from heart cath done with [facility]. In the last four days, he has had inconsistent exertional shortness of breath. He states that he does not have any chest pain or tightness and no swelling of feet. He states that chest does feel a little achy. Patient has not been taking the Imdur since leaving the hospital due to getting covid a couple of weeks ago. Advised patient that if symptoms get any worse to go to ER or Urgent Care. Patient voiced understanding. Please advise, thank you

## 2023-04-26 LAB — BMP (EXT)
Anion Gap (EXT): 9 mmol/L (ref 3–14)
BUN (EXT): 40 mg/dL — ABNORMAL HIGH (ref 8–26)
CO2 (EXT): 23 mmol/L (ref 22–29)
CalciumCalcium (EXT): 10.4 mg/dL — ABNORMAL HIGH (ref 8.4–10.3)
Chloride (EXT): 109 mmol/L — ABNORMAL HIGH (ref 98–108)
Creatinine (EXT): 1.5 mg/dL — ABNORMAL HIGH (ref 0.7–1.3)
Glucose (EXT): 103 mg/dL — ABNORMAL HIGH (ref 70.0–100.0)
Potassium (EXT): 4.9 mmol/L (ref 3.5–5.1)
Sodium (EXT): 141 mmol/L (ref 135.0–145.0)
eGFR - Creat MDRD (EXT): 54 mL/min/{1.73_m2} — ABNORMAL LOW (ref >=60)

## 2023-04-26 NOTE — Telephone Encounter (Signed)
I am not sure why he would have stopped taking the Imdur.  That should not have been stopped just because he had COVID.  Please tell him to go back on it immediately.

## 2023-04-26 NOTE — Telephone Encounter (Signed)
Called patient and lmov stating that Dr. Delene Loll is not sure why he would have stopped taking the Imdur.  That should not have been stopped just because he had COVID.  Please tell him to go back on it immediately.

## 2023-04-28 ENCOUNTER — Encounter: Payer: BLUE CROSS/BLUE SHIELD | Attending: Interventional Cardiology | Primary: Family Medicine

## 2023-07-28 LAB — LIPID PROFILE (EXT)
Cholesterol (EXT): 125 mg/dL (ref <200)
HDL Cholesterol (EXT): 43 mg/dL (ref >=40)
LDL Cholesterol, CALC (EXT): 49 mg/dL (ref <100)
NON HDL Cholesterol (EXT): 82 mg/dL
Triglycerides (EXT): 167 mg/dL — ABNORMAL HIGH (ref <150)
VLDL Cholesterol (EXT): 33 mg/dL — ABNORMAL HIGH (ref 7–24)

## 2023-10-10 LAB — CMP (EXT)
ALT/SGPT (EXT): 838 U/L — ABNORMAL HIGH (ref 10–55)
AST/SGOT (EXT): 433 U/L — ABNORMAL HIGH (ref 10–40)
Albumin (EXT): 4.7 g/dL (ref 3.3–5.0)
Alkaline Phosphatase (EXT): 121 U/L — ABNORMAL HIGH (ref 45–115)
Anion Gap (EXT): 13 mmol/L (ref 3–17)
BUN (EXT): 21 mg/dL (ref 8–25)
Bilirubin, Total (EXT): 1.6 mg/dL — ABNORMAL HIGH (ref 0.0–1.0)
CO2 (EXT): 22 mmol/L — ABNORMAL LOW (ref 23–32)
CalciumCalcium (EXT): 9.7 mg/dL (ref 8.5–10.5)
Chloride (EXT): 107 mmol/L (ref 98–108)
Creatinine (EXT): 1.37 mg/dL — ABNORMAL HIGH (ref 0.60–1.30)
GFR Estimated (Calc) (EXT): 61 mL/min/{1.73_m2} (ref >59)
Globulin (EXT): 3.2 g/dL (ref 1.9–4.1)
Glucose (EXT): 96 mg/dL (ref 70–110)
Potassium (EXT): 4.3 mmol/L (ref 3.4–5.0)
Protein (EXT): 7.9 g/dL (ref 6.0–8.3)
Sodium (EXT): 142 mmol/L (ref 135–145)

## 2023-10-10 LAB — HEMOGLOBIN A1C
Estimated Average Glucose mg/dL (INT/EXT): 114 mg/dL
HEMOGLOBIN A1C % (INT/EXT): 5.6 % (ref 4.3–5.6)
